# Patient Record
Sex: Female | Born: 1940
Health system: Southern US, Community
[De-identification: ages and names within clinical notes are randomized; demographics above are authoritative.]

## PROBLEM LIST (undated history)

## (undated) DIAGNOSIS — R319 Hematuria, unspecified: Secondary | ICD-10-CM

## (undated) DIAGNOSIS — R112 Nausea with vomiting, unspecified: Secondary | ICD-10-CM

## (undated) DIAGNOSIS — J309 Allergic rhinitis, unspecified: Secondary | ICD-10-CM

## (undated) DIAGNOSIS — M51369 Other intervertebral disc degeneration, lumbar region without mention of lumbar back pain or lower extremity pain: Secondary | ICD-10-CM

## (undated) DIAGNOSIS — E785 Hyperlipidemia, unspecified: Secondary | ICD-10-CM

## (undated) DIAGNOSIS — Z8619 Personal history of other infectious and parasitic diseases: Secondary | ICD-10-CM

## (undated) DIAGNOSIS — H3530 Unspecified macular degeneration: Secondary | ICD-10-CM

## (undated) DIAGNOSIS — S0340XA Sprain of jaw, unspecified side, initial encounter: Secondary | ICD-10-CM

## (undated) DIAGNOSIS — Z8719 Personal history of other diseases of the digestive system: Secondary | ICD-10-CM

## (undated) DIAGNOSIS — N3281 Overactive bladder: Secondary | ICD-10-CM

## (undated) DIAGNOSIS — E538 Deficiency of other specified B group vitamins: Secondary | ICD-10-CM

## (undated) DIAGNOSIS — N6009 Solitary cyst of unspecified breast: Secondary | ICD-10-CM

## (undated) DIAGNOSIS — M8580 Other specified disorders of bone density and structure, unspecified site: Secondary | ICD-10-CM

## (undated) DIAGNOSIS — Z860101 Personal history of adenomatous and serrated colon polyps: Secondary | ICD-10-CM

## (undated) DIAGNOSIS — K759 Inflammatory liver disease, unspecified: Secondary | ICD-10-CM

## (undated) DIAGNOSIS — D259 Leiomyoma of uterus, unspecified: Secondary | ICD-10-CM

## (undated) DIAGNOSIS — E559 Vitamin D deficiency, unspecified: Secondary | ICD-10-CM

## (undated) DIAGNOSIS — N61 Mastitis without abscess: Secondary | ICD-10-CM

## (undated) DIAGNOSIS — K5730 Diverticulosis of large intestine without perforation or abscess without bleeding: Secondary | ICD-10-CM

## (undated) DIAGNOSIS — M4800 Spinal stenosis, site unspecified: Secondary | ICD-10-CM

## (undated) DIAGNOSIS — Z87442 Personal history of urinary calculi: Secondary | ICD-10-CM

## (undated) DIAGNOSIS — N390 Urinary tract infection, site not specified: Secondary | ICD-10-CM

## (undated) DIAGNOSIS — I700 Atherosclerosis of aorta: Secondary | ICD-10-CM

## (undated) DIAGNOSIS — K449 Diaphragmatic hernia without obstruction or gangrene: Secondary | ICD-10-CM

## (undated) DIAGNOSIS — N3941 Urge incontinence: Secondary | ICD-10-CM

## (undated) DIAGNOSIS — K219 Gastro-esophageal reflux disease without esophagitis: Secondary | ICD-10-CM

## (undated) DIAGNOSIS — D219 Benign neoplasm of connective and other soft tissue, unspecified: Secondary | ICD-10-CM

## (undated) DIAGNOSIS — Z9889 Other specified postprocedural states: Secondary | ICD-10-CM

## (undated) DIAGNOSIS — N200 Calculus of kidney: Secondary | ICD-10-CM

## (undated) DIAGNOSIS — R079 Chest pain, unspecified: Secondary | ICD-10-CM

## (undated) DIAGNOSIS — G4700 Insomnia, unspecified: Secondary | ICD-10-CM

## (undated) HISTORY — DX: Hematuria, unspecified: R31.9

## (undated) HISTORY — DX: Urinary tract infection, site not specified: N39.0

## (undated) HISTORY — DX: Mastitis without abscess: N61.0

## (undated) HISTORY — DX: Sprain of jaw, unspecified side, initial encounter: S03.40XA

## (undated) HISTORY — DX: Benign neoplasm of connective and other soft tissue, unspecified: D21.9

## (undated) HISTORY — DX: Solitary cyst of unspecified breast: N60.09

## (undated) HISTORY — DX: Inflammatory liver disease, unspecified: K75.9

## (undated) HISTORY — DX: Spinal stenosis, site unspecified: M48.00

## (undated) HISTORY — DX: Insomnia, unspecified: G47.00

## (undated) HISTORY — PX: BREAST CYST ASPIRATION: SHX578

## (undated) HISTORY — DX: Overactive bladder: N32.81

## (undated) HISTORY — PX: LITHOTRIPSY: SUR834

## (undated) HISTORY — DX: Calculus of kidney: N20.0

---
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
## 1977-07-10 HISTORY — PX: TUBAL LIGATION: SHX77

## 1986-07-10 HISTORY — PX: CHOLECYSTECTOMY: SHX55

## 1987-07-11 HISTORY — PX: CHOLECYSTECTOMY: SHX55

## 1990-05-10 DIAGNOSIS — N61 Mastitis without abscess: Secondary | ICD-10-CM

## 1990-05-10 HISTORY — DX: Mastitis without abscess: N61.0

## 1997-07-10 DIAGNOSIS — N200 Calculus of kidney: Secondary | ICD-10-CM

## 1997-07-10 HISTORY — DX: Calculus of kidney: N20.0

## 1998-03-30 ENCOUNTER — Observation Stay (HOSPITAL_COMMUNITY): Admission: EM | Admit: 1998-03-30 | Discharge: 1998-04-01 | Payer: Self-pay | Admitting: Emergency Medicine

## 1998-03-30 ENCOUNTER — Encounter: Payer: Self-pay | Admitting: Emergency Medicine

## 1998-03-30 ENCOUNTER — Encounter: Payer: Self-pay | Admitting: Urology

## 1998-03-31 ENCOUNTER — Encounter: Payer: Self-pay | Admitting: Urology

## 1998-04-05 ENCOUNTER — Ambulatory Visit (HOSPITAL_COMMUNITY): Admission: RE | Admit: 1998-04-05 | Discharge: 1998-04-05 | Payer: Self-pay | Admitting: Urology

## 1998-05-28 ENCOUNTER — Other Ambulatory Visit: Admission: RE | Admit: 1998-05-28 | Discharge: 1998-05-28 | Payer: Self-pay | Admitting: *Deleted

## 1999-02-01 ENCOUNTER — Ambulatory Visit (HOSPITAL_COMMUNITY): Admission: RE | Admit: 1999-02-01 | Discharge: 1999-02-01 | Payer: Self-pay | Admitting: *Deleted

## 1999-02-01 ENCOUNTER — Encounter: Payer: Self-pay | Admitting: *Deleted

## 1999-04-10 HISTORY — PX: OTHER SURGICAL HISTORY: SHX169

## 1999-04-12 ENCOUNTER — Ambulatory Visit (HOSPITAL_COMMUNITY): Admission: RE | Admit: 1999-04-12 | Discharge: 1999-04-12 | Payer: Self-pay | Admitting: Obstetrics and Gynecology

## 1999-04-12 ENCOUNTER — Encounter (INDEPENDENT_AMBULATORY_CARE_PROVIDER_SITE_OTHER): Payer: Self-pay

## 1999-04-12 HISTORY — PX: DILATATION & CURRETTAGE/HYSTEROSCOPY WITH RESECTOCOPE: SHX5572

## 1999-05-09 ENCOUNTER — Ambulatory Visit (HOSPITAL_COMMUNITY): Admission: RE | Admit: 1999-05-09 | Discharge: 1999-05-09 | Payer: Self-pay | Admitting: Obstetrics and Gynecology

## 1999-05-09 ENCOUNTER — Encounter: Payer: Self-pay | Admitting: Obstetrics and Gynecology

## 1999-05-24 ENCOUNTER — Other Ambulatory Visit: Admission: RE | Admit: 1999-05-24 | Discharge: 1999-05-24 | Payer: Self-pay | Admitting: *Deleted

## 1999-12-14 ENCOUNTER — Other Ambulatory Visit: Admission: RE | Admit: 1999-12-14 | Discharge: 1999-12-14 | Payer: Self-pay | Admitting: *Deleted

## 2000-02-21 ENCOUNTER — Encounter: Payer: Self-pay | Admitting: Urology

## 2000-02-21 ENCOUNTER — Encounter: Admission: RE | Admit: 2000-02-21 | Discharge: 2000-02-21 | Payer: Self-pay | Admitting: Urology

## 2001-02-07 ENCOUNTER — Other Ambulatory Visit: Admission: RE | Admit: 2001-02-07 | Discharge: 2001-02-07 | Payer: Self-pay | Admitting: *Deleted

## 2001-12-09 ENCOUNTER — Ambulatory Visit (HOSPITAL_COMMUNITY): Admission: RE | Admit: 2001-12-09 | Discharge: 2001-12-09 | Payer: Self-pay | Admitting: Gastroenterology

## 2001-12-11 ENCOUNTER — Encounter: Admission: RE | Admit: 2001-12-11 | Discharge: 2001-12-11 | Payer: Self-pay | Admitting: Urology

## 2001-12-11 ENCOUNTER — Encounter: Payer: Self-pay | Admitting: Urology

## 2002-04-21 ENCOUNTER — Other Ambulatory Visit: Admission: RE | Admit: 2002-04-21 | Discharge: 2002-04-21 | Payer: Self-pay | Admitting: *Deleted

## 2003-03-18 ENCOUNTER — Emergency Department (HOSPITAL_COMMUNITY): Admission: EM | Admit: 2003-03-18 | Discharge: 2003-03-18 | Payer: Self-pay | Admitting: Emergency Medicine

## 2003-04-29 ENCOUNTER — Other Ambulatory Visit: Admission: RE | Admit: 2003-04-29 | Discharge: 2003-04-29 | Payer: Self-pay | Admitting: *Deleted

## 2003-06-16 ENCOUNTER — Ambulatory Visit: Admission: RE | Admit: 2003-06-16 | Discharge: 2003-06-16 | Payer: Self-pay | Admitting: Gynecologic Oncology

## 2005-06-29 ENCOUNTER — Other Ambulatory Visit: Admission: RE | Admit: 2005-06-29 | Discharge: 2005-06-29 | Payer: Self-pay | Admitting: Obstetrics and Gynecology

## 2006-08-20 ENCOUNTER — Other Ambulatory Visit: Admission: RE | Admit: 2006-08-20 | Discharge: 2006-08-20 | Payer: Self-pay | Admitting: Obstetrics & Gynecology

## 2007-12-09 ENCOUNTER — Other Ambulatory Visit: Admission: RE | Admit: 2007-12-09 | Discharge: 2007-12-09 | Payer: Self-pay | Admitting: Obstetrics & Gynecology

## 2010-11-25 NOTE — Procedures (Signed)
[place]  Patient:    Vanessa Trujillo, Vanessa Trujillo Visit Number: [id number] MRN: [id number]          Service Type: END Location: ENDO Attending Physician:  Rich Brave Dictated by:   Florencia Reasons, M.D. Proc. Date: 12/09/01 Admit Date:  12/09/2001   CC:         Andres Ege, M.D.   Procedure Report  PROCEDURE PERFORMED:  Colonoscopy.  ENDOSCOPIST:  Florencia Reasons, M.D.  INDICATIONS FOR PROCEDURE:  The patient is a 70 year old for colon cancer screening, no worrisome symptoms.  FINDINGS:  Normal exam to the cecum.  DESCRIPTION OF PROCEDURE:  The nature, purpose and risks of the procedure had been discussed with the patient, who provided written consent.  Sedation was fentanyl 65 mcg and Versed  7 mg IV without arrhythmias or desaturation.  The Olympus standard pediatric video colonoscope was advanced to the cecum with moderate difficulty due to looping, overcome by having the patient in the supine position with external abdominal compression and flipping her to the right lateral decubitus position to get the tip of the scope to enter the cecum.  Pullback was then performed.  The quality of the prep was excellent and it is felt that all areas were well seen.  This was a normal examination.  No polyps, cancer, colitis, vascular malformations or diverticulosis were noted and retroflexion in the rectum was normal.  Reinspection of the rectum was unremarkable.  No biopsies or photographs were obtained.  The patient tolerated the procedure well and there were no apparent complications.  IMPRESSION:  Normal screening colonoscopy.  PLAN:  Consider follow-up colonoscopy in five years. Dictated by:   Florencia Reasons, M.D. Attending Physician:  Rich Brave DD:  12/09/01 TD:  12/10/01 Job: [id number]

## 2010-11-25 NOTE — Consult Note (Signed)
NAME:  Vanessa Trujillo, DAULT NO.:  [id number]   MEDICAL RECORD NO.:  [id number]                   PATIENT TYPE:  OUT   LOCATION:  GYN                                  FACILITY:  [facility]   PHYSICIAN:  John T. Kyla Balzarine, M.D.                 DATE OF BIRTH:  [birth year]   DATE OF CONSULTATION:  06/16/2003  DATE OF DISCHARGE:                                   CONSULTATION   REPORT TITLE:  GYNECOLOGICAL CONSULTATION/CLINIC NOTE   CONSULTING PHYSICIAN:  John T. Kyla Balzarine, M.D.   REFERRING PHYSICIAN:  Andres Ege, M.D.   REASON FOR CONSULTATION:  Persistent ovarian cysts.   HISTORY OF PRESENT ILLNESS:  The patient has been followed by Dr. Laurena Bering for  many years, undergoing menopause in her mid 7s.  She was initially on ERT,  but this was discontinued several years ago.  She is currently using vaginal  estrogen.  The patient was found to have small ovarian cysts on ultrasound,  approximately three years ago.  The cysts include approximately a 3 cm thin-  walled cyst involving the right ovary, and approximately a 1.5 cm simple  cyst in the left ovary.  On serial scanning, the cysts have not  significantly changed, if anything, have decreased slightly in size, but the  last scans in January 2004 and subsequent scan in October 2004 raise the  question of multiple thin septations developing in the right adnexal cyst.  The patient has had several CA-125 values that are in the normal range.  The  patient and Dr. Laurena Bering wished a second opinion regarding options for  management of these cysts.   The patient reflects that she has no abdominal or pelvic symptoms, pelvic  pain, or dyspareunia.  Her menopausal symptoms are relatively well  controlled.  She is up to date on mammograms and colonoscopy.  Risk factors  for ovarian cancer include no family history of ovarian cancer, one family  member with premenopausal breast cancer, more than ten years of oral  contraceptive use  in the past, and status post BTL.   PAST MEDICAL HISTORY:  No major comorbidities.   PAST SURGICAL HISTORY:  Bilateral tubal ligation; NSVD.   MEDICATIONS:  1. Vaginal estrogen.  2. Calcium.  3. Multivitamins.   ALLERGIES:  None.   FAMILY HISTORY:  Mother with premenopausal breast cancer.  Maternal  grandmother may have had breast cancer at a later date, but no other family  members are known to have any type of gynecologic malignancies.   SOCIAL HISTORY:  Married.  Denies tobacco.  Very rare ethanol.   REVIEW OF SYSTEMS:  GENERAL:  Denies weight loss, fever or chill, diminished  appetite.  GASTROINTESTINAL:  Denies nausea or vomiting, early satiety,  bloating, gas/cramps, constipation, diarrhea, melena.  GENITOURINARY:  Denies urinary incontinence, urgency, or frequency, frequent UTI, or stones,  and denies hematuria.  OBSTETRICAL/GYNECOLOGICAL:  As per  HPI.  LYMPHATICS:  Denies lymphadenopathy.   PHYSICAL EXAMINATION:  VITAL SIGNS:  Weight 138 pounds, and vital signs  stable per clinic flow sheet.  GENERAL:  The patient is alert and oriented x3 in no acute distress.  LYMPHS:  There is no pathologic adenopathy.  BACK:  Nontender.  There is no CVA tenderness.  ABDOMEN:  Scaphoid soft and benign without ascites, mass, or organomegaly.  Laparoscopic incision site is well-healed without hernia.  There is no  pathologic lymphadenopathy.  EXTREMITIES:  Have full range of motion without edema.  PELVIC:  External genitalia and BUS normal to inspection palpation.  The  bladder and urethra are well supported and there are no vaginal or cervical  lesions.  There is no tenderness to cervical motion.  Uterus normal size and  shape.  Adnexa:  No palpable abnormality or cul-de-sac nodularity.  Rectovaginal examination confirms.   ASSESSMENT:  Benign persistent ovarian cysts.   RECOMMENDATIONS:  I had a long discussion with the patient, detailing her  low risk for a malignant lesion.  I  would recommend that she be scanned at  six month intervals and eventually transitioned to annual intervals as long  as there is no significant increase in size or development of features  worrisome for ovarian cancer.  Threshold for surgery would include patient  anxiety, development of pelvic symptoms, enlargement of the ovarian cysts,  or development of any features suggesting malignancy.  I would probably  monitor CA-125 values, although this is not failsafe.   If the patient elects surgery at present, it should be noted that she has  had no menopausal bleeding, has a very thin endometrial stripe, and is up to  date on cytology.  I believe the laparoscopic bilateral salpingo-  oophorectomy would be a reasonable option for her if she elected operative  management, unless she developed another reason to undergo hysterectomy.  The patient will consider these factors and probably elect nonsurgical  monitoring with Dr. Laurena Bering.  We would be glad to see her back at any time in  the future on a p.r.n. basis.                                               John T. Kyla Balzarine, M.D.    JTS/MEDQ  D:  06/16/2003  T:  06/16/2003  Job:  [id number]   cc:   Andres Ege, M.D.  [address]  [place]  [place] [address]  Fax: [phone]   Telford Nab, R.N.  [address]  [place], [place] [address]

## 2012-04-08 HISTORY — PX: COLONOSCOPY WITH PROPOFOL: SHX5780

## 2013-01-27 ENCOUNTER — Telehealth: Payer: Self-pay | Admitting: *Deleted

## 2013-01-27 NOTE — Telephone Encounter (Signed)
Call to [employer] to complete prior auth on macrodantin 50-mg #90, 1 po QD.  Clinical info provided to [facility], 1) Dx:  prophalxias of recurrent UTI 2) benefit > risk, also advised that Dr Hyacinth Meeker reviewed this with Dr Sherron Monday (urologist) 10-27-12.  Per Seymour Bars approved thru 07-09-13, for pharmacy to re-process in 2 hours and should go thru. Patient notified auth completed.

## 2013-04-08 ENCOUNTER — Other Ambulatory Visit: Payer: Self-pay | Admitting: Obstetrics & Gynecology

## 2013-04-08 NOTE — Telephone Encounter (Signed)
#  30/12 Rfs given @ AEX 04/01/12 ; Current AEX 05/08/13 with Dr.SM #30/0rfs sent to last patient till AEX

## 2013-04-16 DIAGNOSIS — N200 Calculus of kidney: Secondary | ICD-10-CM | POA: Insufficient documentation

## 2013-04-16 DIAGNOSIS — R3915 Urgency of urination: Secondary | ICD-10-CM | POA: Insufficient documentation

## 2013-04-16 DIAGNOSIS — N3941 Urge incontinence: Secondary | ICD-10-CM | POA: Insufficient documentation

## 2013-04-16 DIAGNOSIS — N390 Urinary tract infection, site not specified: Secondary | ICD-10-CM | POA: Insufficient documentation

## 2013-05-08 ENCOUNTER — Ambulatory Visit (INDEPENDENT_AMBULATORY_CARE_PROVIDER_SITE_OTHER): Payer: Medicare Other | Admitting: Obstetrics & Gynecology

## 2013-05-08 ENCOUNTER — Encounter: Payer: Self-pay | Admitting: Obstetrics & Gynecology

## 2013-05-08 VITALS — BP 120/68 | HR 64 | Resp 16 | Ht 64.0 in | Wt 143.0 lb

## 2013-05-08 DIAGNOSIS — Z01419 Encounter for gynecological examination (general) (routine) without abnormal findings: Secondary | ICD-10-CM

## 2013-05-08 DIAGNOSIS — N83201 Unspecified ovarian cyst, right side: Secondary | ICD-10-CM

## 2013-05-08 DIAGNOSIS — N83209 Unspecified ovarian cyst, unspecified side: Secondary | ICD-10-CM

## 2013-05-08 MED ORDER — NITROFURANTOIN MACROCRYSTAL 50 MG PO CAPS: 50.0000 mg | ORAL_CAPSULE | Freq: Every day | ORAL | Status: DC

## 2013-05-08 MED ORDER — CITALOPRAM HYDROBROMIDE 20 MG PO TABS: ORAL_TABLET | ORAL | Status: DC

## 2013-05-08 NOTE — Progress Notes (Signed)
72 y.o. G1P1 Widowed CaucasianF here for annual exam.  No vaginal bleeding.  Did have colonoscopy.  I do not have records.  Spouse has been deceased now about 15 months.  Pt with hx of recurrent UTIs.  Saw Dr. Logan Bores.  Had a CT scan and cystoscopy.  Had one very small stone.  Seeing Dr. Wylene Simmer.  Labs are now done there.     No LMP recorded. Patient is postmenopausal.          Sexually active: no  The current method of family planning is Menopause, BTL    Exercising: yes  Water Aerobics 3 days a week Smoker:  no  Health Maintenance: Pap:  02/01/10 neg History of abnormal Pap:  no MMG:  04/21/13 normal Colonoscopy: 08/2012 normal, repeat in 5 years BMD:   04/21/13, -1.0 TDaP: 03/2003 Screening Labs: Dr. Wylene Simmer, Hb today: PCP, Urine today: PCP   reports that she has never smoked. She has never used smokeless tobacco. She reports that she drinks about 0.5 ounces of alcohol per week. She reports that she does not use illicit drugs.  Past Medical History  Diagnosis Date  . Breast cyst     Aspirated  . Kidney stones 1999  . Chronic UTI   . Hematuria   . Spinal stenosis   . Nonpuerperal mastitis 11/91  . Fibroid     Past Surgical History  Procedure Laterality Date  . Tonsillectomy and adenoidectomy  1946  . Appendectomy  1959  . Tubal ligation  1979    BTL  . Cholecystectomy  1988  . Hysteroscopic resection  04/1999    small fibroid polyps    Current Outpatient Prescriptions  Medication Sig Dispense Refill  . citalopram (CELEXA) 20 MG tablet TAKE 1 TABLET BY MOUTH EVERY DAY  30 tablet  0  . Multiple Vitamin (MULTI-VITAMIN DAILY PO) Take by mouth daily.      . Multiple Vitamins-Minerals (EYE VITAMINS PO) Take by mouth daily.      Marland Kitchen MYRBETRIQ 50 MG TB24 tablet daily.       . nitrofurantoin (MACRODANTIN) 50 MG capsule daily.       . simvastatin (ZOCOR) 20 MG tablet 20 mg daily.       . fluticasone (FLONASE) 50 MCG/ACT nasal spray        No current facility-administered  medications for this visit.    Family History  Problem Relation Age of Onset  . Breast cancer Mother 9  . Heart failure Father   . Emphysema Father   . Breast cancer Maternal Grandmother 50    ROS:  Pertinent items are noted in HPI.  Otherwise, a comprehensive ROS was negative.  Exam:   BP 120/68  Pulse 64  Resp 16  Ht 5\' 4"  (1.626 m)  Wt 143 lb (64.864 kg)  BMI 24.53 kg/m2  Weight change: +2lbs   Height: 5\' 4"  (162.6 cm)  Ht Readings from Last 3 Encounters:  05/08/13 5\' 4"  (1.626 m)    General appearance: alert, cooperative and appears stated age Head: Normocephalic, without obvious abnormality, atraumatic Neck: no adenopathy, supple, symmetrical, trachea midline and thyroid normal to inspection and palpation Lungs: clear to auscultation bilaterally Breasts: normal appearance, no masses or tenderness Heart: regular rate and rhythm Abdomen: soft, non-tender; bowel sounds normal; no masses,  no organomegaly Extremities: extremities normal, atraumatic, no cyanosis or edema Skin: Skin color, texture, turgor normal. No rashes or lesions Lymph nodes: Cervical, supraclavicular, and axillary nodes normal. No abnormal inguinal nodes  palpated Neurologic: Grossly normal   Pelvic: External genitalia:  no lesions              Urethra:  normal appearing urethra with no masses, tenderness or lesions              Bartholins and Skenes: normal                 Vagina: normal appearing vagina with normal color and discharge, 1.5cm nodule on right side of vaginal wall              Cervix: no lesions              Pap taken: no Bimanual Exam:  Uterus:  normal size, contour, position, consistency, mobility, non-tender              Adnexa: normal adnexa and no mass, fullness, tenderness               Rectovaginal: Confirms               Anus:  normal sphincter tone, no lesions  A:  Well Woman with normal exam PMP, no HRT Needs TDaP OAB H/O recurrent UTI's, on chronic suppression.  Saw  Dr. Logan Bores this year with cystoscopy and CT. Insomnia H/O ovarian cysts, new lesion within  P:   Mammogram yearly pap smear 2011.  No Pap today. Labs with Dr. Wylene Simmer. Citalopram 20mg  daily.  Rx to pharmacy. Nitrofurantoin 50mg  daily.  Rx to pharmacy.  Needs urine culture with any UTI.  Consider changing to Trimethoprim if recurrent UTIs continue.  return annually or prn  An After Visit Summary was printed and given to the patient.

## 2013-05-08 NOTE — Patient Instructions (Signed)

## 2013-05-09 ENCOUNTER — Telehealth: Payer: Self-pay | Admitting: Obstetrics & Gynecology

## 2013-05-09 NOTE — Telephone Encounter (Signed)
LMTCB to discuss ins benefits for upcoming PUS.  °

## 2013-05-13 ENCOUNTER — Ambulatory Visit (INDEPENDENT_AMBULATORY_CARE_PROVIDER_SITE_OTHER): Payer: Medicare Other | Admitting: Obstetrics & Gynecology

## 2013-05-13 ENCOUNTER — Other Ambulatory Visit: Payer: Self-pay | Admitting: Obstetrics & Gynecology

## 2013-05-13 ENCOUNTER — Ambulatory Visit (INDEPENDENT_AMBULATORY_CARE_PROVIDER_SITE_OTHER): Payer: Medicare Other

## 2013-05-13 VITALS — BP 102/60 | Ht 64.0 in | Wt 143.4 lb

## 2013-05-13 DIAGNOSIS — N83209 Unspecified ovarian cyst, unspecified side: Secondary | ICD-10-CM

## 2013-05-13 DIAGNOSIS — N83201 Unspecified ovarian cyst, right side: Secondary | ICD-10-CM

## 2013-05-13 DIAGNOSIS — D259 Leiomyoma of uterus, unspecified: Secondary | ICD-10-CM

## 2013-05-13 DIAGNOSIS — D251 Intramural leiomyoma of uterus: Secondary | ICD-10-CM

## 2013-05-13 DIAGNOSIS — D219 Benign neoplasm of connective and other soft tissue, unspecified: Secondary | ICD-10-CM

## 2013-05-13 NOTE — Progress Notes (Signed)
72 y.o.Marriedfemale here for a pelvic ultrasound with know hx of fibroids and bilateral ovarian cysts that have been avascular but do not completely look like simple cysts.  I have not scanned her in several years.  She was here recently for AEX and we discussed this hx.  Decided to repeat scan this year.  No new pain/pressure issues.  No vaginal bleeding.  Biggest issue, of late, has been recurrent UTIs.  Seeing Dr. Logan Bores at [facility] for this.  No LMP recorded. Patient is postmenopausal.  Sexually active:  yes  Contraception: PMP  FINDINGS: UTERUS: 5.5 x 3.9 x 2.3cm with 2 small intramural fibroids and 3.6 pedunculated fibroid to right (all stable) EMS: 1.63mm ADNEXA:   Left ovary 2.4 x 1.7 x 1.5cm with 38m cyst containing debris.  Avascular.  Stable.    Right ovary 2.7 x 1.4 x 1.4cm with 18mm cyst containing debris.  Avascular.  Stable. CUL DE SAC: neg for free fluid  Images reviewed with pt.  All questions answered.  Comparison made to 2011 u/s.  Uterus and ovaries are stable.    Assessment:  Uterine fibroids, bilateral ovarian cysts Plan: F/U for AEX 1 yr.  ~15 minutes spent with patient >50% of time was in face to face discussion of above.

## 2013-05-15 ENCOUNTER — Other Ambulatory Visit: Payer: Self-pay

## 2013-05-19 ENCOUNTER — Encounter: Payer: Self-pay | Admitting: Obstetrics & Gynecology

## 2013-05-19 ENCOUNTER — Ambulatory Visit (INDEPENDENT_AMBULATORY_CARE_PROVIDER_SITE_OTHER): Payer: Medicare Other | Admitting: Obstetrics & Gynecology

## 2013-05-19 VITALS — Ht 65.5 in | Wt 143.0 lb

## 2013-05-19 DIAGNOSIS — N390 Urinary tract infection, site not specified: Secondary | ICD-10-CM

## 2013-05-19 MED ORDER — CIPROFLOXACIN HCL 500 MG PO TABS: 500.0000 mg | ORAL_TABLET | Freq: Two times a day (BID) | ORAL | Status: DC

## 2013-05-19 NOTE — Telephone Encounter (Signed)
Spoke with patient. Appointment scheduled. She will come now to see Dr. Amie Critchley

## 2013-05-19 NOTE — Patient Instructions (Signed)
Please call if symptoms are worse tomorrow.

## 2013-05-19 NOTE — Telephone Encounter (Signed)
Patient thinks she has a UTI. Patient would like an appointment today.

## 2013-05-19 NOTE — Progress Notes (Signed)
Subjective:     Patient ID: Vanessa Trujillo, female   DOB: 1941-05-15, 72 y.o.   MRN: [id number]  Urinary Tract Infection    72 yo WWF, well known to me, with h/o recurrent UTIs with onset of symptoms this morning including urgency, frequency, dysuria.  Has not seen any blood.  No fevers.  Just feeling a little achy.  Had UTI last month.  Saw Dr. Logan Bores.  Reviewed via Care Everywhere in EPIC.  Klebsiella was seen.  Intermediate sensitivity to Macrobid.  Resistant to Ampicillin.  Pt cannot give me a sample today.  Review of Systems  All other systems reviewed and are negative.       Objective:   Physical Exam  Constitutional: She is oriented to person, place, and time. She appears well-developed and well-nourished.  Genitourinary: Vagina normal.  Musculoskeletal: Normal range of motion.  Neurological: She is alert and oriented to person, place, and time.  Skin: Skin is warm and dry.  Psychiatric: She has a normal mood and affect.   Cath u/a performed under sterile conditions for urine sample.    Assessment:     UTI H/O recurrent UTIs, on suppression     Plan:     Urine culture  Cipro 500mg  BID x 7 days.  Will need TOC. Pt to stop macrobid right now and will restart after culture, unless this needs to be changed

## 2013-05-20 LAB — URINE CULTURE: Organism ID, Bacteria: NO GROWTH

## 2013-05-21 NOTE — Telephone Encounter (Signed)
Patient returned call. She states she is feeling much improved. She will complete antibiotics and follow up prn with Dr. Hyacinth Meeker and Dr. Logan Bores.  I advised that if we heard any new instructions from Dr. Logan Bores we would return call, patient is agreeable to plan.    Routing to provider for final review. Patient agreeable to disposition. Will close encounter

## 2013-05-21 NOTE — Telephone Encounter (Signed)
Message copied by Joeseph Amor on Wed May 21, 2013 10:19 AM ------      Message from: Jerene Bears      Created: Wed May 21, 2013  5:37 AM       Please call pt and inform culture is negative.  How is she feeling?  I want her to finish the antibiotics.  I have sent copy of this and note to dr. Logan Bores.  Stay on the macrobid for now and I have asked him if he has any other recommendations. ------

## 2013-05-21 NOTE — Telephone Encounter (Signed)
Message left to return call to [place] at [phone] on home and mobile.

## 2013-05-22 ENCOUNTER — Encounter: Payer: Self-pay | Admitting: Obstetrics & Gynecology

## 2013-05-22 NOTE — Patient Instructions (Signed)
Please call if you have any new problems/issues 

## 2013-06-03 ENCOUNTER — Other Ambulatory Visit: Payer: Self-pay | Admitting: Obstetrics & Gynecology

## 2013-06-03 NOTE — Telephone Encounter (Signed)
AEX was 05/08/13   Macrodantin 50 mg #90/4 refills was sent   No rx was sent for Myrbetriq okay to refill as well?  Please advise.

## 2013-06-09 ENCOUNTER — Ambulatory Visit (INDEPENDENT_AMBULATORY_CARE_PROVIDER_SITE_OTHER): Payer: Medicare Other | Admitting: Obstetrics & Gynecology

## 2013-06-09 ENCOUNTER — Encounter: Payer: Self-pay | Admitting: Obstetrics & Gynecology

## 2013-06-09 VITALS — BP 120/60 | HR 60 | Resp 16 | Ht 65.5 in | Wt 143.0 lb

## 2013-06-09 DIAGNOSIS — N390 Urinary tract infection, site not specified: Secondary | ICD-10-CM

## 2013-06-09 MED ORDER — TRIMETHOPRIM 100 MG PO TABS: 100.0000 mg | ORAL_TABLET | Freq: Every day | ORAL | Status: DC

## 2013-06-09 NOTE — Progress Notes (Signed)
Subjective:     Patient ID: Vanessa Trujillo, female   DOB: Jul 09, 1941, 72 y.o.   MRN: [id number]  HPI Extremely nice 72 yo G1P1 WWF here to discuss recent urine culture and possible change in antibiotics.  Pt has had several UTI's this year.  Most recently, had symptoms in early November.  Urine culture was obtained before pt started treatment.  Culture was negative.  Pt was prescribed antibiotics while culture results were pending.  She was called and advised of results but by that point was doing much better.  She was surprised cultrue was engative.  Has been on macrobid for years for suppression.  She did see Dr. Logan Bores this year with negative evaluation.  She and I discussed stopping the macrobid and changing to different suppresion.  I have sent message through EPIC to Dr. Logan Bores as well as faxing results to him.  No response received.  D/W this and that I would keep trying to let him know about recent visit with patient.  Review of Systems  All other systems reviewed and are negative.       Objective:   Physical Exam  Constitutional: She is oriented to person, place, and time. She appears well-developed and well-nourished.  Neurological: She is alert and oriented to person, place, and time.  Psychiatric: She has a normal mood and affect.       Assessment:     Recurrent UTIs, asymptomatic today     Plan:     Switch to trimethoprim 100mg  daily for suppression.  Rx to pharmacy. Pt to call with any UTI symptoms.

## 2013-06-09 NOTE — Patient Instructions (Signed)
Please call with any new problems/issues. 

## 2013-06-10 ENCOUNTER — Encounter: Payer: Self-pay | Admitting: Obstetrics & Gynecology

## 2013-08-11 ENCOUNTER — Encounter: Payer: Self-pay | Admitting: Nurse Practitioner

## 2013-08-11 ENCOUNTER — Ambulatory Visit (INDEPENDENT_AMBULATORY_CARE_PROVIDER_SITE_OTHER): Payer: Medicare Other | Admitting: Nurse Practitioner

## 2013-08-11 VITALS — BP 118/76 | HR 64 | Temp 98.7°F | Ht 65.5 in | Wt 141.0 lb

## 2013-08-11 DIAGNOSIS — R300 Dysuria: Secondary | ICD-10-CM

## 2013-08-11 LAB — POCT URINALYSIS DIPSTICK
Bilirubin, UA: NEGATIVE
Glucose, UA: NEGATIVE
Ketones, UA: NEGATIVE
Nitrite, UA: NEGATIVE
Urobilinogen, UA: NEGATIVE
pH, UA: 6.5

## 2013-08-11 MED ORDER — CIPROFLOXACIN HCL 500 MG PO TABS: 500.0000 mg | ORAL_TABLET | Freq: Two times a day (BID) | ORAL | Status: DC

## 2013-08-11 NOTE — Progress Notes (Signed)
Dr.Subjective:     Patient ID: Vanessa Trujillo, female   DOB: [birth year], 73 y.o.   MRN: [id number]  This 73 yo WW Fe presents with UTI symptoms since this am.  She denies fever/ chills, nausea and vomiting. maybe some vague low back pain.   She has been having chronic symptoms for some time.  Saw Dr. Amalia Hailey first of December and Myrbetriq was increased from 25  mg to 50 mg. She notes no improvement with urge incontinence and now is wearing a light pad.  Her previous Uro evaluation has been quite extensive with CT with no problems found. Not SA X 15 yrs ago. Husband has been deceased for several years.  She is also followed by Dr. Sabra Heck and has a recheck appointment of March 6 th.  She is off Estring.  States she does not drink enough fluids in the winter.   Urinary Tract Infection  Associated symptoms include urgency. Pertinent negatives include no chills, flank pain or frequency.   Review of Systems  Constitutional: Negative for fever, chills and fatigue.  Respiratory: Negative.   Cardiovascular: Negative.   Gastrointestinal: Negative.  Negative for abdominal pain, diarrhea, constipation and abdominal distention.  Genitourinary: Positive for dysuria, urgency and decreased urine volume. Negative for frequency, flank pain, vaginal bleeding, vaginal discharge, vaginal pain and dyspareunia.  Psychiatric/Behavioral: Negative.        Objective:   Physical Exam  Constitutional: She is oriented to person, place, and time. She appears well-developed and well-nourished. No distress.  Abdominal: Soft. She exhibits no distension. There is tenderness. There is no rebound and no guarding.  No flank pain, some discomfort over lower abdomen to palpation.  Genitourinary:  Urine abnormal with large  RBC, 2 + WBC and trace protein.  Neurological: She is alert and oriented to person, place, and time.  Psychiatric: She has a normal mood and affect. Her behavior is normal. Judgment and thought content normal.        Assessment:     R/O UTI - chronic history of same    Plan:     Will hold Trimpex for now and start on Cipro 500 mg BID # 14 Call with urine culture and micro reports Recheck TOC in 2 weeks and still keep appointment with Dr. Sabra Heck in 4 weeks. Increase po fluids, add cranberry tabs daily as directed.

## 2013-08-11 NOTE — Patient Instructions (Signed)
Urinary Tract Infection Urinary tract infections (UTI's) can develop anywhere along your urinary tract. Your urinary tract is your body's drainage system for removing wastes and extra water. Your urinary tract includes two kidneys, two ureters, a bladder, and a urethra. Your kidneys are a pair of bean-shaped organs. Each kidney is about the size of your fist. They are located below your ribs, one on each side of your spine. CAUSES Infections are caused by microbes, which are microscopic organisms, including fungi, viruses, and bacteria. These organisms are so small that they can only be seen through a microscope. Bacteria are the microbes that most commonly cause UTI's. SYMPTOMS  Symptoms of UTI's may vary by age and gender of the patient and by the location of the infection. Symptoms in young women typically include a frequent and intense urge to urinate and a painful, burning feeling in the bladder or urethra during urination. Older women and men are more likely to be tired, shaky, and weak and have muscle aches and abdominal pain. A fever may mean the infection is in your kidneys. Other symptoms of a kidney infection include pain in your back or sides below the ribs, nausea, and vomiting. DIAGNOSIS To diagnose a UTI, your caregiver will ask you about your symptoms. Your caregiver also will ask to provide a urine sample. The urine sample will be tested for bacteria and white blood cells. White blood cells are made by your body to help fight infection. TREATMENT  Typically, UTI's can be treated with medication. Because most UTI's are caused by a bacterial infection, they usually can be treated with the use of antibiotics. The choice of antibiotic and length of treatment depend on your symptoms and the type of bacteria causing your infection. HOME CARE INSTRUCTIONS  If you were prescribed antibiotics, take them exactly as your caregiver instructs you. Finish the medication even if you feel better after  you have only taken some of the medication.  Drink enough water and fluids to keep your urine clear or pale yellow.  Avoid caffeine, tea, and carbonated beverages. They tend to irritate your bladder.  Empty your bladder often. Avoid holding urine for long periods of time.  Empty your bladder before and after sexual intercourse.  After a bowel movement, women should cleanse from front to back. Use each tissue only once. SEEK MEDICAL CARE IF:   You have back pain.  You develop a fever.  Your symptoms do not begin to resolve within 3 days. SEEK IMMEDIATE MEDICAL CARE IF:   You have severe back pain or lower abdominal pain.  You develop chills.  You have nausea or vomiting.  You have continued burning or discomfort with urination. MAKE SURE YOU:   Understand these instructions.  Will watch your condition.  Will get help right away if you are not doing well or get worse. Document Released: 04/05/2005 Document Revised: 12/26/2011 Document Reviewed: 08/04/2011 Exit Care Patient Information 2014 Exit [employer].   Cranberry tablets 1-2

## 2013-08-11 NOTE — Progress Notes (Signed)
Reviewed personally.  M. Suzanne Cherese Lozano, MD.  

## 2013-08-12 LAB — URINALYSIS, MICROSCOPIC ONLY
Casts: NONE SEEN
Crystals: NONE SEEN
Squamous Epithelial / LPF: NONE SEEN
WBC, UA: >50 WBC/hpf — AB (ref <3)

## 2013-08-13 LAB — URINE CULTURE: Colony Count: 5000

## 2013-08-15 ENCOUNTER — Telehealth: Payer: Self-pay | Admitting: *Deleted

## 2013-08-15 NOTE — Telephone Encounter (Signed)
I have attempted to contact this patient by phone with the following results: left message to return my call on answering machine (home per ROI).  

## 2013-08-15 NOTE — Telephone Encounter (Signed)
Message copied by Graylon Good on Fri Aug 15, 2013  1:49 PM ------      Message from: Kem Boroughs R      Created: Thu Aug 14, 2013  8:52 AM       Let patient know that urine culture showed insignificant growth but she did have significant WBC on micro urine test.  Have her to finish antibiotics and still do TOC in 2 weeks.  Then she is seeing Dr. Sabra Heck for follow up in first of March. ------

## 2013-08-19 NOTE — Telephone Encounter (Signed)
Pt notified in result note.  

## 2013-08-25 ENCOUNTER — Ambulatory Visit (INDEPENDENT_AMBULATORY_CARE_PROVIDER_SITE_OTHER): Payer: Medicare Other | Admitting: *Deleted

## 2013-08-25 VITALS — BP 121/73 | HR 75 | Resp 14 | Wt 143.0 lb

## 2013-08-25 DIAGNOSIS — N390 Urinary tract infection, site not specified: Secondary | ICD-10-CM

## 2013-08-25 NOTE — Progress Notes (Signed)
Urine collected sent for Culture.

## 2013-08-27 ENCOUNTER — Telehealth: Payer: Self-pay

## 2013-08-27 LAB — URINE CULTURE
Colony Count: NO GROWTH
Organism ID, Bacteria: NO GROWTH

## 2013-08-27 NOTE — Telephone Encounter (Signed)
Message copied by Susy Manor on Wed Aug 27, 2013 12:52 PM ------      Message from: Regina Eck      Created: Wed Aug 27, 2013 12:24 PM       Notify urine culture negative, complete medication she is on      Keep appt. With Dr. Sabra Heck as planned ------

## 2013-08-27 NOTE — Telephone Encounter (Signed)
lmtcb

## 2013-08-28 NOTE — Telephone Encounter (Signed)
Patient notified of results.

## 2013-09-01 ENCOUNTER — Telehealth: Payer: Self-pay | Admitting: Obstetrics & Gynecology

## 2013-09-01 NOTE — Telephone Encounter (Signed)
Need chart to see what she has been on already.

## 2013-09-01 NOTE — Telephone Encounter (Signed)
Patient wants to speak with the nurse about medication questions about Myrbetriq.

## 2013-09-01 NOTE — Telephone Encounter (Signed)
Dr. Sabra Heck, patient is calling and asking if there is any other medications you may recommend instead of the Mybetriq 50 mg ER tablets as it is costly at 250/month. I spoke with [facility] her rx provider and it is covered but is a tier 3 drug, that is not qualified for tier exception on the the dx of OAB.   Do you have any other suggestions for medications or an appeal can be made for medical necessity. Chart to your office.

## 2013-09-10 NOTE — Telephone Encounter (Signed)
Her chart is one my desk.  I don't think she has tried [place].  Can you check chart.  Don't know about coverage.  She will have to check her medication booklet but we can try this.  Coverage is usually decent.

## 2013-09-10 NOTE — Telephone Encounter (Signed)
Called patient to r/s appointment and she asked about Dr Sabra Heck finding a similar, but cheaper medication to Myrbetriq. This was prescribed by Dr Amalia Hailey after we referred her there. We have prescribed several different similar medications in the past. She has been off this x one week and can tell it definitely was working, but is just too expensive. Is aware that Dr Sabra Heck is out of the office for the rest of the week, but will send her a message and if no reply can check with Dr Quincy Simmonds. Please advise.

## 2013-09-11 NOTE — Telephone Encounter (Signed)
Left detailed message for patient//kn

## 2013-09-12 ENCOUNTER — Ambulatory Visit: Payer: Medicare Other | Admitting: Obstetrics & Gynecology

## 2013-09-16 ENCOUNTER — Encounter: Payer: Self-pay | Admitting: Obstetrics & Gynecology

## 2013-09-17 ENCOUNTER — Encounter: Payer: Self-pay | Admitting: Obstetrics & Gynecology

## 2013-09-17 ENCOUNTER — Ambulatory Visit (INDEPENDENT_AMBULATORY_CARE_PROVIDER_SITE_OTHER): Payer: Medicare Other | Admitting: Obstetrics & Gynecology

## 2013-09-17 VITALS — BP 118/64 | HR 58 | Resp 12 | Ht 65.5 in | Wt 142.6 lb

## 2013-09-17 DIAGNOSIS — N390 Urinary tract infection, site not specified: Secondary | ICD-10-CM

## 2013-09-17 MED ORDER — CIPROFLOXACIN HCL 500 MG PO TABS: 500.0000 mg | ORAL_TABLET | Freq: Two times a day (BID) | ORAL | Status: DC

## 2013-09-17 NOTE — Progress Notes (Signed)
73 y.o. Widowed Caucasian female G1P1 here for follow up of recurrent UTIs.  Stopped the myrbetriq 25mg  due to cost.  Saw [name] who started her on Vesicare 5mg .  She has samples for both 5 and 10mg .  Can't really tell much of a difference after two days with the 5mg .  Advised just to go ahead and increase dosage to 10mg .  She is on the trimethoprim without any issues now for several months.  Going to [place] for 2 weeks and would like rx for antibiotics, just in case.    D/w pt options for OAB meds. She is going to check her medicare book and let me know as I will probably need to a prior-authorization for anything other than the generic medications.  She has been on oxybutynin which doesn't seem to be helping now.  The Myrbetriq really did work well but she can't justify >$200/month.   O: Healthy WD,WN female Affect: normal  A:OAB Recurrent UTI  P: Pt will call and let me know about the coverage in her Medicare book.   No rx needed for Trimethoprim Cipro 500mg  bid x 14 days for trip.  Rx to pharmacy.  ~15 minutes spent with patient >50% of time was in face to face discussion of above.

## 2013-09-17 NOTE — Patient Instructions (Signed)
Call and let me know about the coverage for the medications we discussed.

## 2013-09-25 NOTE — Telephone Encounter (Signed)
Patient is calling kelly back about medications

## 2013-09-26 MED ORDER — FESOTERODINE FUMARATE ER 4 MG PO TB24: 4.0000 mg | ORAL_TABLET | Freq: Every day | ORAL | Status: DC

## 2013-09-26 NOTE — Telephone Encounter (Signed)
Spoke with Dr. Sabra Heck, okay to order Toviaz 4 mg 1 tablet daily. Spoke with patient, advised order sent to [facility] and if has any issues with Lisbeth Ply will then submit tier exception for Myrbetriq.   Patient agreeable to plan and will call back with any issues on Toviaz.   Routing to provider for final review. Patient agreeable to disposition. Will close encounter

## 2013-09-26 NOTE — Telephone Encounter (Signed)
Dr. Sabra Heck, patient is on vesicare samples but is having "extremely dry mouth" per patient. Now out of those samples. Patient has not tried [place].  Patient states she is currently followed by her eye doctor q 3 months for glaucoma.  She states "I would rather have dry mouth than pay for mybetriq" as well.   Advised would send request to Dr. Sabra Heck, would place order for Vesicare if agreeable.

## 2013-09-26 NOTE — Telephone Encounter (Signed)
Patient requesting a RX for either Vesicare or Toviaz. She has some question first.

## 2013-12-06 ENCOUNTER — Other Ambulatory Visit: Payer: Self-pay | Admitting: Obstetrics & Gynecology

## 2013-12-08 NOTE — Telephone Encounter (Signed)
Pt calling the nurse regarding her Rx for [place]. She does not think the medication is helping her at all. Thinks the dosage may need to be changed. She also thinks she may have another uti.

## 2013-12-08 NOTE — Telephone Encounter (Signed)
Last AEX 04/2013  Last refill 09/26/13 #30/1 refill Next appt 05/2014   Please approve or deny Rx.

## 2013-12-08 NOTE — Telephone Encounter (Signed)
Spoke with patient. She feels she had a urinary tract infection about two weeks ago and took the Cipro that she had on hand. She completed the Cipro 500 mg bid for 7 days on 12/05/13. She is requesting a nurse appointment to "check my urine to make sure the infection is gone". She denies complaints at this time. She states "I am feeling well, but I don't think the Lisbeth Ply is working at all!" She wants to know if she should increase the dose of toviaz or if we could start the process for prior authorization of Mybetriq since that works the best. I advised I would send a message to Dr. Sabra Heck to obtain orders.   Okay for nurse appointment for urine culture and increase toviaz or complete prior auth for Mybetriq?

## 2013-12-09 ENCOUNTER — Telehealth: Payer: Self-pay | Admitting: Emergency Medicine

## 2013-12-09 NOTE — Telephone Encounter (Signed)
Karen Chafe, RN at 12/08/2013 4:59 PM     Status: Signed        Spoke with patient. She feels she had a urinary tract infection about two weeks ago and took the Cipro that she had on hand. She completed the Cipro 500 mg bid for 7 days on 12/05/13. She is requesting a nurse appointment to "check my urine to make sure the infection is gone". She denies complaints at this time. She states "I am feeling well, but I don't think the Lisbeth Ply is working at all!" She wants to know if she should increase the dose of toviaz or if we could start the process for prior authorization of Mybetriq since that works the best. I advised I would send a message to Dr. Sabra Heck to obtain orders.  Okay for nurse appointment for urine culture and increase toviaz or complete prior auth for Mybetriq?

## 2013-12-10 MED ORDER — FESOTERODINE FUMARATE ER 8 MG PO TB24: 8.0000 mg | ORAL_TABLET | Freq: Every day | ORAL | Status: DC

## 2013-12-10 NOTE — Addendum Note (Signed)
Addended by: Megan Salon on: 12/10/2013 04:13 PM   Modules accepted: Orders

## 2013-12-10 NOTE — Telephone Encounter (Signed)
Increase Toviaz to 8mg  daily.    Start prior authorization for Mybetriq.  Ok to schedule for urine TOC with nursing visit.

## 2013-12-10 NOTE — Telephone Encounter (Signed)
I just increased her dosage to 8mg  but I think we should do the Merbetriq prior authorization.

## 2013-12-11 NOTE — Telephone Encounter (Signed)
Prior authorization sent to [facility]:

## 2013-12-12 ENCOUNTER — Ambulatory Visit: Payer: Medicare Other

## 2013-12-15 ENCOUNTER — Ambulatory Visit (INDEPENDENT_AMBULATORY_CARE_PROVIDER_SITE_OTHER): Payer: Medicare Other | Admitting: *Deleted

## 2013-12-15 VITALS — BP 110/66 | HR 96 | Resp 18 | Ht 65.5 in | Wt 143.0 lb

## 2013-12-15 DIAGNOSIS — N390 Urinary tract infection, site not specified: Secondary | ICD-10-CM

## 2013-12-15 NOTE — Progress Notes (Signed)
Patient presents today for TOC. Done with Cipro. Patient denies any Sx.

## 2013-12-16 LAB — URINE CULTURE
Colony Count: NO GROWTH
Organism ID, Bacteria: NO GROWTH

## 2013-12-16 LAB — URINALYSIS, MICROSCOPIC ONLY
Bacteria, UA: NONE SEEN
CASTS: NONE SEEN
Crystals: NONE SEEN
Squamous Epithelial / LPF: NONE SEEN

## 2013-12-23 NOTE — Telephone Encounter (Signed)
Spoke with patient, message from Dr. Sabra Heck given and scheduled office visit for Cath UA with Dr. Sabra Heck for 12/29/13.  Routing to provider for final review. Patient agreeable to disposition. Will close encounter

## 2013-12-23 NOTE — Telephone Encounter (Signed)
Message left to return call to [place] at [phone].  Patient needs result note as below from Dr. Sabra Heck and cath UA office visit.

## 2013-12-23 NOTE — Telephone Encounter (Signed)
Message copied by Michele Mcalpine on Tue Dec 23, 2013  3:15 PM ------      Message from: Megan Salon      Created: Sun Dec 21, 2013 10:44 PM       Please inform pt that the urine culture was negative.  However this is a little blood in her urine still.  She should return for a cath u/a with me.  If RBCs still present, I will have he see Dr. Amalia Hailey again.  Thanks.  I did sent the following message to her through [facility].            Vanessa Trujillo,      Your urine culture is negative.  There is a small amount of blood still present in your urine.  I would like you to return for a catheterized urine to make sure this blood is real.  It could just come from the thinner skin that is present after menopause.  It really shouldn't be present but it is a tiny amount.  I just want to be sure.  One of the nurses at my office will be calling you with specific instructions.  Thanks.            Dr. Sabra Heck ------

## 2013-12-26 ENCOUNTER — Telehealth: Payer: Self-pay | Admitting: Obstetrics & Gynecology

## 2013-12-26 ENCOUNTER — Encounter: Payer: Self-pay | Admitting: Obstetrics and Gynecology

## 2013-12-26 ENCOUNTER — Ambulatory Visit (INDEPENDENT_AMBULATORY_CARE_PROVIDER_SITE_OTHER): Payer: Medicare Other | Admitting: Obstetrics and Gynecology

## 2013-12-26 VITALS — BP 118/64 | HR 80 | Ht 65.5 in | Wt 141.4 lb

## 2013-12-26 DIAGNOSIS — N390 Urinary tract infection, site not specified: Secondary | ICD-10-CM

## 2013-12-26 DIAGNOSIS — R300 Dysuria: Secondary | ICD-10-CM

## 2013-12-26 LAB — POCT URINALYSIS DIPSTICK
Bilirubin, UA: NEGATIVE
Glucose, UA: NEGATIVE
KETONES UA: NEGATIVE
Nitrite, UA: NEGATIVE
PH UA: 5
Protein, UA: 8
UROBILINOGEN UA: NEGATIVE

## 2013-12-26 MED ORDER — NITROFURANTOIN MONOHYD MACRO 100 MG PO CAPS: 100.0000 mg | ORAL_CAPSULE | Freq: Two times a day (BID) | ORAL | Status: DC

## 2013-12-26 NOTE — Progress Notes (Signed)
Patient ID: Vanessa Trujillo, female   DOB: 1941-06-24, 73 y.o.   MRN: [id number] GYNECOLOGY VISIT  PCP:   [name]  Referring Vanessa Trujillo:   HPI: 73 y.o.   Married  Caucasian  female   G1P1 with Patient's last menstrual period was 07/11/1999.   here for   Possible urinary tract infection/lower pressure with urinating.  Having painful urination.  States she usually had blood in the urine whenever she has a urinalysis done. When voids, only a small amount comes out and patient states she has bladder contractions and pain. Urination does not relieve the pain.  Taking over the counter uricom (pyyidium) Uncertain if having fever. Feels chilled sometimes.  Some nausea. No vomiting.   Is scheduled for cath urine in 3 days with Dr. Sabra Heck as patient has had urinary discomfort and has had multiple negative urine cultures.  Has an Rx for Ciprofloxacin but has not taken any.   History of nephrolithiasis years ago, treated surgically.   Has chronic urinary problems. Saw Dr. Amalia Hailey.  Had cystoscopy which was normal. No interstitial cystitis.  Placed on Myrbetriq. Was too expensive so started Toviaz. Notes mouth dryness.  Urine:  2+WBC's, Mod.RBC's, 8 Protein  GYNECOLOGIC HISTORY: Patient's last menstrual period was 07/11/1999. Sexually active:  no Partner preference: female Contraception: postmenopausal   Menopausal hormone therapy:  DES exposure:  no  Blood transfusions:   no Sexually transmitted diseases:   no GYN procedures and prior surgeries:  Tubal, breast cyst aspiration, hysteroscopic resection small fibroid polyps 04/1999. Last mammogram:   10-12-14wnl              Last pap and high risk HPV testing: 02-01-10 wnl   History of abnormal pap smear:  no   OB History   Grav Para Term Preterm Abortions TAB SAB Ect Mult Living   1 1        1        LIFESTYLE: Exercise:               Tobacco: no Alcohol:   no  Drugs:    no  Patient Active Problem List   Diagnosis Date  Noted  . Calculus of kidney 04/16/2013  . Urge incontinence 04/16/2013  . Urgency of micturation 04/16/2013  . Infection of urinary tract 04/16/2013    Past Medical History  Diagnosis Date  . Breast cyst     Aspirated  . Kidney stones 1999  . Chronic UTI   . Hematuria   . Spinal stenosis   . Nonpuerperal mastitis 11/91  . Fibroid   . OAB (overactive bladder)   . Insomnia   . Hepatitis     age 73    Past Surgical History  Procedure Laterality Date  . Tonsillectomy and adenoidectomy  1946  . Appendectomy  1959  . Tubal ligation  1979    BTL  . Cholecystectomy  1988  . Hysteroscopic resection  04/1999    small fibroid polyps  . Breast cyst aspiration    . Lithotripsy      stent    Current Outpatient Prescriptions  Medication Sig Dispense Refill  . citalopram (CELEXA) 20 MG tablet TAKE 1 TABLET BY MOUTH EVERY DAY  90 tablet  4  . fesoterodine (TOVIAZ) 8 MG TB24 tablet Take 1 tablet (8 mg total) by mouth daily.  30 tablet  2  . Multiple Vitamins-Minerals (EYE VITAMINS PO) Take by mouth daily.      . simvastatin (ZOCOR) 20 MG tablet  20 mg daily.       Marland Kitchen trimethoprim (TRIMPEX) 100 MG tablet Take 1 tablet (100 mg total) by mouth daily.  90 tablet  4   No current facility-administered medications for this visit.     ALLERGIES: Review of patient's allergies indicates no known allergies.  Family History  Problem Relation Age of Onset  . Breast cancer Mother 62  . Heart failure Father   . Emphysema Father   . Breast cancer Maternal Grandmother 31    History   Social History  . Marital Status: Married    Spouse Name: N/A    Number of Children: N/A  . Years of Education: N/A   Occupational History  . Not on file.   Social History Main Topics  . Smoking status: Never Smoker   . Smokeless tobacco: Never Used  . Alcohol Use: No  . Drug Use: No  . Sexual Activity: No   Other Topics Concern  . Not on file   Social History Narrative  . No narrative on file     ROS:  Pertinent items are noted in HPI.  PHYSICAL EXAMINATION:   Temp 98.7 BP 118/64  Pulse 80  Ht 5' 5.5" (1.664 m)  Wt 141 lb 6.4 oz (64.139 kg)  BMI 23.16 kg/m2  LMP 07/11/1999   Wt Readings from Last 3 Encounters:  12/26/13 141 lb 6.4 oz (64.139 kg)  12/15/13 143 lb (64.864 kg)  09/17/13 142 lb 9.6 oz (64.683 kg)     Ht Readings from Last 3 Encounters:  12/26/13 5' 5.5" (1.664 m)  12/15/13 5' 5.5" (1.664 m)  09/17/13 5' 5.5" (1.664 m)    General appearance: alert, cooperative and appears stated age Lungs: clear to auscultation bilaterally Back - No CVA tenderness. Heart: regular rate and rhythm Abdomen: soft, non-tender; no masses,  no organomegaly   ASSESSMENT  Dysuria.  Urine dip look positive for infection today.  Overactive bladder. On Vanessa Trujillo - just stopped acutely.  PLAN  Urine micro and culture.  Start Macrobid 100 mg po bid for 7 days.  Agree to stop Toviaz at least acutely while having current evaluation for infection, as it may be contributing to some urinary retention.  Hydrate. Warning signs of pyelonephritis discussed with patient.  Keep appointment for 12/29/13 for re-evaluation with Dr. Sabra Heck.  An After Visit Summary was printed and given to the patient.  20 minutes face to face time of which over 50% was spent in counseling.   After visit summary to patient.

## 2013-12-26 NOTE — Telephone Encounter (Signed)
Patient was seen by Milford Cage, FNP on 08/11/13 and had increased white blood cells in her urine was started on antibiotics and had TOC on 2/18 which was negative. Was instructed to complete antibiotics and follow up with [email or web address] in March for recurrent UTI. Patient had u/a with another doctor on 3/9 which was negative. At appointment in march with Dr.Miller patient was on trimethoprim for several months and was increased to 10mg  of vesicare. Patient was also given Cipro 500mg  bid for 14 days for vacation. Patient was then seen on 6/8 for a urine culture and micro which showed blood in the urine. Patient has cath UA scheduled with Dr.Miller for 6/22 but is calling today stating that she is having "UTI symptoms and is not sure what she should do." Appointment scheduled for today at 10:30am with Dr.Silva (time per Gay Filler). Patient is agreeable to date and time.  Routing to Dr.Silva CC: Dr.Miller

## 2013-12-26 NOTE — Patient Instructions (Signed)
Take the Macrobid 100 mg by mouth twice a day for one week. Drink plenty of water. Stop the Toviaz while we are treating what appears to be infection.  Keep your appointment with Dr. Sabra Heck for 12/29/13. Go to urgent care if you develop fever, nausea and vomiting, or back pain.

## 2013-12-26 NOTE — Telephone Encounter (Signed)
Patient thinks she has a UTI but has an appointment on Monday for Cath UA. Isnt sure what she needs to do. There arent any appointments to give. Its held for triage

## 2013-12-27 LAB — URINALYSIS, ROUTINE W REFLEX MICROSCOPIC
BILIRUBIN URINE: NEGATIVE
GLUCOSE, UA: NEGATIVE mg/dL
Ketones, ur: NEGATIVE mg/dL
Nitrite: POSITIVE — AB
PH: 6.5 (ref 5.0–8.0)
PROTEIN: 100 mg/dL — AB
Specific Gravity, Urine: 1.022 (ref 1.005–1.030)
Urobilinogen, UA: 1 mg/dL (ref 0.0–1.0)

## 2013-12-27 LAB — URINALYSIS, MICROSCOPIC ONLY
Casts: NONE SEEN
Crystals: NONE SEEN
Squamous Epithelial / LPF: NONE SEEN

## 2013-12-29 ENCOUNTER — Ambulatory Visit (INDEPENDENT_AMBULATORY_CARE_PROVIDER_SITE_OTHER): Payer: Medicare Other | Admitting: Obstetrics & Gynecology

## 2013-12-29 ENCOUNTER — Encounter: Payer: Self-pay | Admitting: Obstetrics & Gynecology

## 2013-12-29 VITALS — BP 110/62 | HR 68 | Temp 98.3°F | Resp 20 | Ht 65.5 in | Wt 141.0 lb

## 2013-12-29 DIAGNOSIS — N3000 Acute cystitis without hematuria: Secondary | ICD-10-CM

## 2013-12-29 DIAGNOSIS — R32 Unspecified urinary incontinence: Secondary | ICD-10-CM

## 2013-12-29 NOTE — Progress Notes (Signed)
Subjective:     Patient ID: Vanessa Trujillo, female   DOB: [birth year], 73 y.o.   MRN: [id number]  HPI 73 yo G1P1 DWF here for follow up after being seen 6/19 for UTI symptoms.  She has two major issues--incontinence which has been felt to be primarily urge incontinence.  She has been on Vesicare, Toviaz, [place], and Myrbetriq, generic oxybutynin.  She only one that does completely dry her out is Myrbetriq.  It is so expensive for her--is a Tier 3 medication.  I have submitted a request for tier exception which was recently denied.  Most recently, she was on the Toviaz 8mg .  She felt so "dry" with it.  Her mouth, in particular, was so dry.  She decided to stop this last week.  Her other issue is recurrent UTI symptoms.  She has been on suppressive therapy with Trimethoprim.  She has had several cultures this year--some are positive and some or negative when she has UTI symptoms.  Most recently (6/19) her culture was positive for E Coli.  Sensitivities are still pending.  She is on Macrobid and reports feeling much better.  She has used Cipro quite a bit in the past as well but now feels nausea when she takes it.    Pt seen by Dr. Alona Bene in October 2014.  Notes reviewed through [place] in [facility].  She underwent a CT and cystoscopy due to persistent microscopic hematuria.  She had some small renal cysts and what appears to be three small, non-obstructing stones.  Also, there was a smaller hypodense lesion in the left kidney that was not completely characterized but thought to be benign.  Of note, the patient has hx of ovarian cysts that have been followed on ultrasound as recently as 10/14.  Although there is some complexity to the cyst on the left ovary, it is stable and has been watched for several years.    Pt really wants something else done if possible.  She cannot remember having urodynamics done recnetly.  I do not know if there is a component of stress incontinence that could be repaired  that would improve Mrs. Swim's quality of life.     Review of Systems  All other systems reviewed and are negative.      Objective:   Physical Exam  Constitutional: She is oriented to person, place, and time. She appears well-developed and well-nourished.  Neurological: She is alert and oriented to person, place, and time.  Psychiatric: She has a normal mood and affect.   No other physical exam performed today.    Assessment:     UTI, improved OAB with incontinence     Plan:     Refer back to Dr. Amalia Hailey for consideration of urodynamics Finish macrobid bid x 7 days Urine sensitivities are pending.  Will give pt a copy of this and my note to take with her when she sees Dr. Amalia Hailey.  ~15 minutes spent with patient >50% of time was in face to face discussion of above.

## 2013-12-30 LAB — CULTURE, URINE COMPREHENSIVE: Colony Count: 40000

## 2014-01-02 NOTE — Addendum Note (Signed)
Addended by: Megan Salon on: 01/02/2014 10:53 AM   Modules accepted: Orders

## 2014-01-08 ENCOUNTER — Telehealth: Payer: Self-pay

## 2014-01-08 NOTE — Telephone Encounter (Signed)
Message copied by Robley Fries on Thu Jan 08, 2014  1:49 PM ------      Message from: Megan Salon      Created: Fri Jan 02, 2014 10:51 AM       She is going to go see Dr. Amalia Hailey again.  That was our plan at her last visit but she should have a repeat urine culture a week after finishing the antibiotics to ensure culture negative.  Please schedule.  I will place order. ------

## 2014-01-08 NOTE — Telephone Encounter (Signed)
Lmtcb//kn 

## 2014-01-13 NOTE — Telephone Encounter (Signed)
Patient aware. See result note//kn

## 2014-05-11 ENCOUNTER — Encounter: Payer: Self-pay | Admitting: Obstetrics & Gynecology

## 2014-06-02 ENCOUNTER — Ambulatory Visit: Payer: Medicare Other | Admitting: Obstetrics & Gynecology

## 2014-06-12 ENCOUNTER — Telehealth: Payer: Self-pay | Admitting: Certified Nurse Midwife

## 2014-06-27 ENCOUNTER — Other Ambulatory Visit: Payer: Self-pay | Admitting: Obstetrics & Gynecology

## 2014-06-29 NOTE — Telephone Encounter (Signed)
Medication refill request: Celexa 20mg  Last AEX:  05/08/13 Next AEX: NS Last Office Visit: 12/29/13 Last MMG (if hormonal medication request): NA Refill authorized: #90 day X 3

## 2014-07-24 ENCOUNTER — Encounter: Payer: Self-pay | Admitting: Obstetrics & Gynecology

## 2014-07-24 ENCOUNTER — Ambulatory Visit (INDEPENDENT_AMBULATORY_CARE_PROVIDER_SITE_OTHER): Payer: Medicare Other | Admitting: Obstetrics & Gynecology

## 2014-07-24 VITALS — BP 122/78 | HR 68 | Resp 16 | Ht 64.75 in | Wt 144.0 lb

## 2014-07-24 DIAGNOSIS — Z124 Encounter for screening for malignant neoplasm of cervix: Secondary | ICD-10-CM | POA: Diagnosis not present

## 2014-07-24 DIAGNOSIS — Z01419 Encounter for gynecological examination (general) (routine) without abnormal findings: Secondary | ICD-10-CM

## 2014-07-24 MED ORDER — NITROFURANTOIN MONOHYD MACRO 100 MG PO CAPS: 100.0000 mg | ORAL_CAPSULE | Freq: Every day | ORAL | Status: DC

## 2014-07-24 MED ORDER — CITALOPRAM HYDROBROMIDE 20 MG PO TABS: 20.0000 mg | ORAL_TABLET | Freq: Every day | ORAL | Status: DC

## 2014-07-24 NOTE — Progress Notes (Signed)
73 y.o. G1P1 Widowed CaucasianF here for annual exam.  Mother in law died 2023/06/27.  Patient has been care giving for several years.    Pt did have a UTI in December as well.  On suppression.  Patient had rx on hand and took this for a week.  Symptoms resolved.  Pt does have follow-up planned with Dr. Amalia Hailey.  Wants to proceed with urodynamics.  Wants to consider stopping her Citalopram.    PCP:  [name]   Patient's last menstrual period was 07/11/1999.          Sexually active: No.  The current method of family planning is post menopausal status.    Exercising: Yes.    yoga and water aerobics Smoker:  no  Health Maintenance: Pap:  01/28/10 WNL History of abnormal Pap:  yes MMG:  04/21/13 3D-normal Colonoscopy:  2/14-repeat in 5 years BMD:   04/21/13 TDaP:  Will check with Dr Osborne Casco Screening Labs: Dr Osborne Casco, Hb today: Dr Osborne Casco, Urine today: Dr Amalia Hailey   reports that she has never smoked. She has never used smokeless tobacco. She reports that she does not drink alcohol or use illicit drugs.  Past Medical History  Diagnosis Date  . Breast cyst     Aspirated  . Kidney stones 1999  . Chronic UTI   . Hematuria   . Spinal stenosis   . Nonpuerperal mastitis 11/91  . Fibroid   . OAB (overactive bladder)   . Insomnia   . Hepatitis     age 54    Past Surgical History  Procedure Laterality Date  . Tonsillectomy and adenoidectomy  1946  . Appendectomy  1959  . Tubal ligation  1979    BTL  . Cholecystectomy  1988  . Hysteroscopic resection  04/1999    small fibroid polyps  . Breast cyst aspiration    . Lithotripsy      stent    Current Outpatient Prescriptions  Medication Sig Dispense Refill  . Cholecalciferol (VITAMIN D PO) Take by mouth. 1000IU 2 x daily    . citalopram (CELEXA) 20 MG tablet TAKE 1 TABLET BY MOUTH EVERY DAY 90 tablet 2  . mirabegron ER (MYRBETRIQ) 50 MG TB24 tablet Take 1 tablet by mouth.    . nitrofurantoin, macrocrystal-monohydrate,  (MACROBID) 100 MG capsule Take 1 capsule (100 mg total) by mouth 2 (two) times daily. 14 capsule 0  . NON FORMULARY preservision adred 2 twice daily    . omeprazole (PRILOSEC) 20 MG capsule Take 20 mg by mouth daily.    . simvastatin (ZOCOR) 20 MG tablet 20 mg daily.      No current facility-administered medications for this visit.    Family History  Problem Relation Age of Onset  . Breast cancer Mother 75  . Heart failure Father   . Emphysema Father   . Breast cancer Maternal Grandmother 50    ROS:  Pertinent items are noted in HPI.  Otherwise, a comprehensive ROS was negative.  Exam:   BP 122/78 mmHg  Pulse 68  Resp 16  Ht 5' 4.75" (1.645 m)  Wt 144 lb (65.318 kg)  BMI 24.14 kg/m2  LMP 07/11/1999    Height: 5' 4.75" (164.5 cm)  Ht Readings from Last 3 Encounters:  07/24/14 5' 4.75" (1.645 m)  12/29/13 5' 5.5" (1.664 m)  12/26/13 5' 5.5" (1.664 m)    General appearance: alert, cooperative and appears stated age Head: Normocephalic, without obvious abnormality, atraumatic Neck: no adenopathy, supple,  symmetrical, trachea midline and thyroid normal to inspection and palpation Lungs: clear to auscultation bilaterally Breasts: normal appearance, no masses or tenderness Heart: regular rate and rhythm Abdomen: soft, non-tender; bowel sounds normal; no masses,  no organomegaly Extremities: extremities normal, atraumatic, no cyanosis or edema Skin: Skin color, texture, turgor normal. No rashes or lesions Lymph nodes: Cervical, supraclavicular, and axillary nodes normal. No abnormal inguinal nodes palpated Neurologic: Grossly normal   Pelvic: External genitalia:  no lesions              Urethra:  normal appearing urethra with no masses, tenderness or lesions              Bartholins and Skenes: normal                 Vagina: normal appearing vagina with normal color and discharge, no lesions, 1cm posterior vaginal cyst noted on BME              Cervix: no lesions               Pap taken: Yes.   Bimanual Exam:  Uterus:  normal size, contour, position, consistency, mobility, non-tender              Adnexa: normal adnexa and no mass, fullness, tenderness               Rectovaginal: Confirms               Anus:  normal sphincter tone, no lesions  Chaperone was present for exam.  A:  Well Woman with normal exam PMP, no HRT OAB H/O recurrent UTI's, on chronic suppression. Does see Dr. Amalia Hailey.  Had cystoscopy and CT 2014. Insomnia H/O ovarian cysts, last PUS 2014.  Was stable and has been watched for many years  P: Mammogram yearly pap smear 2011. Pap today. Labs with [name]. Citalopram 20mg  daily. Rx to pharmacy.  Pt will decrease to 10mg  for 2 months and see how she feels.  Then she will stop if she is feeling ok.   Nitrofurantoin 100mg  daily. Rx to pharmacy. Return annually or prn

## 2014-07-27 LAB — IPS PAP SMEAR ONLY

## 2014-08-06 DIAGNOSIS — H903 Sensorineural hearing loss, bilateral: Secondary | ICD-10-CM | POA: Diagnosis not present

## 2014-08-25 DIAGNOSIS — Z09 Encounter for follow-up examination after completed treatment for conditions other than malignant neoplasm: Secondary | ICD-10-CM | POA: Diagnosis not present

## 2014-08-25 DIAGNOSIS — N3941 Urge incontinence: Secondary | ICD-10-CM | POA: Diagnosis not present

## 2014-08-25 DIAGNOSIS — R3915 Urgency of urination: Secondary | ICD-10-CM | POA: Diagnosis not present

## 2014-09-09 DIAGNOSIS — J029 Acute pharyngitis, unspecified: Secondary | ICD-10-CM | POA: Diagnosis not present

## 2014-09-11 DIAGNOSIS — D1801 Hemangioma of skin and subcutaneous tissue: Secondary | ICD-10-CM | POA: Diagnosis not present

## 2014-09-11 DIAGNOSIS — D2271 Melanocytic nevi of right lower limb, including hip: Secondary | ICD-10-CM | POA: Diagnosis not present

## 2014-09-11 DIAGNOSIS — D225 Melanocytic nevi of trunk: Secondary | ICD-10-CM | POA: Diagnosis not present

## 2014-09-11 DIAGNOSIS — D2272 Melanocytic nevi of left lower limb, including hip: Secondary | ICD-10-CM | POA: Diagnosis not present

## 2014-09-11 DIAGNOSIS — L821 Other seborrheic keratosis: Secondary | ICD-10-CM | POA: Diagnosis not present

## 2014-09-21 DIAGNOSIS — E785 Hyperlipidemia, unspecified: Secondary | ICD-10-CM | POA: Diagnosis not present

## 2014-09-21 DIAGNOSIS — Z79899 Other long term (current) drug therapy: Secondary | ICD-10-CM | POA: Diagnosis not present

## 2014-09-28 DIAGNOSIS — Z1389 Encounter for screening for other disorder: Secondary | ICD-10-CM | POA: Diagnosis not present

## 2014-09-28 DIAGNOSIS — Z0000 Encounter for general adult medical examination without abnormal findings: Secondary | ICD-10-CM | POA: Diagnosis not present

## 2014-09-28 DIAGNOSIS — M791 Myalgia: Secondary | ICD-10-CM | POA: Diagnosis not present

## 2014-09-28 DIAGNOSIS — D126 Benign neoplasm of colon, unspecified: Secondary | ICD-10-CM | POA: Diagnosis not present

## 2014-09-28 DIAGNOSIS — E785 Hyperlipidemia, unspecified: Secondary | ICD-10-CM | POA: Diagnosis not present

## 2014-10-28 DIAGNOSIS — D126 Benign neoplasm of colon, unspecified: Secondary | ICD-10-CM | POA: Diagnosis not present

## 2014-10-28 DIAGNOSIS — Z1389 Encounter for screening for other disorder: Secondary | ICD-10-CM | POA: Diagnosis not present

## 2014-10-28 DIAGNOSIS — M791 Myalgia: Secondary | ICD-10-CM | POA: Diagnosis not present

## 2014-10-28 DIAGNOSIS — E785 Hyperlipidemia, unspecified: Secondary | ICD-10-CM | POA: Diagnosis not present

## 2014-10-28 DIAGNOSIS — Z1231 Encounter for screening mammogram for malignant neoplasm of breast: Secondary | ICD-10-CM | POA: Diagnosis not present

## 2015-04-09 ENCOUNTER — Telehealth: Payer: Self-pay | Admitting: Obstetrics & Gynecology

## 2015-04-09 DIAGNOSIS — Z87440 Personal history of urinary (tract) infections: Secondary | ICD-10-CM

## 2015-04-09 NOTE — Telephone Encounter (Signed)
Referral to [facility] placed for history of recurrent UTI's. Spoke with patient. Advised referral has been placed. Advised referrals coordinator in our office will be in contact with her or [facility] will contact her directly regarding date and time of appointment. Patient is agreeable.  Cc: Theresia Lo  Routing to provider for final review. Patient agreeable to disposition. Will close encounter.

## 2015-04-09 NOTE — Telephone Encounter (Signed)
Spoke with patient. Patient states that she is seen with Dr.Evans at [facility] for recurrent UTI's. "I feel like he may have done everything he can for me. I would like another opinion from someone locally to see if they can help." Requesting a referral to a urologist in [place]. Denies any current UTI symptoms. Advised I will speak with covering provider regarding referral and return call. Patient is agreeable.  Dr.Silva, okay to refer over to [facility]?

## 2015-04-09 NOTE — Telephone Encounter (Signed)
[place]ton for referral to [facility].  Thank you!

## 2015-04-09 NOTE — Telephone Encounter (Signed)
Patient would like a referral to a urologist.

## 2015-04-14 ENCOUNTER — Telehealth: Payer: Self-pay | Admitting: Obstetrics & Gynecology

## 2015-04-14 ENCOUNTER — Encounter: Payer: Self-pay | Admitting: Obstetrics and Gynecology

## 2015-04-14 ENCOUNTER — Ambulatory Visit (INDEPENDENT_AMBULATORY_CARE_PROVIDER_SITE_OTHER): Payer: Medicare Other | Admitting: Obstetrics and Gynecology

## 2015-04-14 VITALS — BP 122/80 | HR 64 | Temp 98.2°F | Resp 14 | Wt 142.0 lb

## 2015-04-14 DIAGNOSIS — N390 Urinary tract infection, site not specified: Secondary | ICD-10-CM | POA: Diagnosis not present

## 2015-04-14 DIAGNOSIS — M791 Myalgia, unspecified site: Secondary | ICD-10-CM

## 2015-04-14 DIAGNOSIS — R8290 Unspecified abnormal findings in urine: Secondary | ICD-10-CM

## 2015-04-14 DIAGNOSIS — N3281 Overactive bladder: Secondary | ICD-10-CM

## 2015-04-14 LAB — POCT URINALYSIS DIPSTICK
BILIRUBIN UA: NEGATIVE
Glucose, UA: NEGATIVE
KETONES UA: NEGATIVE
Nitrite, UA: NEGATIVE
PH UA: 5
Protein, UA: NEGATIVE
Urobilinogen, UA: NEGATIVE

## 2015-04-14 MED ORDER — CYCLOBENZAPRINE HCL 5 MG PO TABS: 5.0000 mg | ORAL_TABLET | Freq: Three times a day (TID) | ORAL | Status: DC | PRN

## 2015-04-14 NOTE — Progress Notes (Signed)
GYNECOLOGY  VISIT   HPI: 74 y.o.   Married  Caucasian  female   G1P1 with Patient's last menstrual period was 07/11/1999.   here for  urinary odor, lower back pain. She has noticed the urinary odor for the last week. Starting yesterday she developed lower back pain, constant, 6-9/10 in severity. Hurts to move and bend. She has a h/o a small kidney stone, not sure which side. No dysuria, no increase in her baseline frequency or urgency to void. She has an appointment to see a urologist. She is on myrbetric for OAB.  No fevers, feels flushed at times. She is on daily macrobid for UTI suppression.   GYNECOLOGIC HISTORY: Patient's last menstrual period was 07/11/1999. Contraception: postmenopausal Menopausal hormone therapy: none        OB History    Gravida Para Term Preterm AB TAB SAB Ectopic Multiple Living   1 1        1          Patient Active Problem List   Diagnosis Date Noted  . Calculus of kidney 04/16/2013  . Urge incontinence 04/16/2013  . Urgency of micturation 04/16/2013  . Infection of urinary tract 04/16/2013    Past Medical History  Diagnosis Date  . Breast cyst     Aspirated  . Kidney stones 1999  . Chronic UTI   . Hematuria   . Spinal stenosis   . Nonpuerperal mastitis 11/91  . Fibroid   . OAB (overactive bladder)   . Insomnia   . Hepatitis     age 77    Past Surgical History  Procedure Laterality Date  . Tonsillectomy and adenoidectomy  1946  . Appendectomy  1959  . Tubal ligation  1979    BTL  . Cholecystectomy  1988  . Hysteroscopic resection  04/1999    small fibroid polyps  . Breast cyst aspiration    . Lithotripsy      stent    Current Outpatient Prescriptions  Medication Sig Dispense Refill  . citalopram (CELEXA) 20 MG tablet Take 1 tablet (20 mg total) by mouth daily. 90 tablet 4  . mirabegron ER (MYRBETRIQ) 50 MG TB24 tablet Take 1 tablet by mouth.    . nitrofurantoin, macrocrystal-monohydrate, (MACROBID) 100 MG capsule Take 1 capsule  (100 mg total) by mouth daily. 30 capsule 13  . NON FORMULARY preservision adred 2 twice daily    . omeprazole (PRILOSEC) 20 MG capsule Take 20 mg by mouth daily.    . Cholecalciferol (VITAMIN D PO) Take by mouth. 1000IU 2 x daily    . simvastatin (ZOCOR) 20 MG tablet 20 mg daily.      No current facility-administered medications for this visit.     ALLERGIES: Review of patient's allergies indicates no known allergies.  Family History  Problem Relation Age of Onset  . Breast cancer Mother 28  . Heart failure Father   . Emphysema Father   . Breast cancer Maternal Grandmother 80    Social History   Social History  . Marital Status: Married    Spouse Name: N/A  . Number of Children: N/A  . Years of Education: N/A   Occupational History  . Not on file.   Social History Main Topics  . Smoking status: Never Smoker   . Smokeless tobacco: Never Used  . Alcohol Use: No  . Drug Use: No  . Sexual Activity: No   Other Topics Concern  . Not on file   Social History  Narrative    ROS  No change in urinary frequency or urgency. See HPI, otherwise comprehensive ROS was negative.   PHYSICAL EXAMINATION:    BP 122/80 mmHg  Pulse 64  Temp(Src) 98.2 F (36.8 C)  Resp 14  Wt 142 lb (64.411 kg)  LMP 07/11/1999    General appearance: alert, cooperative and appears stated age Abdomen: soft, non-tender; no masses Back: no CVA tenderness, no real tenderness with palpation of her lower back  ASSESSMENT Urinary odor Urine with 2+ leuk, trace blood H/O recurrent UTI on daily macrobid, no other urinary changes Back pain, cw MS etiology  PLAN Send urine for ua, c&s She is taking NSAID's for her pain Will add Flexeril Consider therapeutic massage or seeing a chiropractor  The patient will call on Friday for her urine culture results   An After Visit Summary was printed and given to the patient.

## 2015-04-14 NOTE — Telephone Encounter (Signed)
Patient coming in today at 74 for lab work she said she will speak to Kaka then.

## 2015-04-14 NOTE — Patient Instructions (Signed)
Call on Friday for urine culture results  Back Pain, Adult Back pain is very common in adults.The cause of back pain is rarely dangerous and the pain often gets better over time.The cause of your back pain may not be known. Some common causes of back pain include:  Strain of the muscles or ligaments supporting the spine.  Wear and tear (degeneration) of the spinal disks.  Arthritis.  Direct injury to the back. For many people, back pain may return. Since back pain is rarely dangerous, most people can learn to manage this condition on their own. HOME CARE INSTRUCTIONS Watch your back pain for any changes. The following actions may help to lessen any discomfort you are feeling:  Remain active. It is stressful on your back to sit or stand in one place for long periods of time. Do not sit, drive, or stand in one place for more than 30 minutes at a time. Take short walks on even surfaces as soon as you are able.Try to increase the length of time you walk each day.  Exercise regularly as directed by your health care provider. Exercise helps your back heal faster. It also helps avoid future injury by keeping your muscles strong and flexible.  Do not stay in bed.Resting more than 1-2 days can delay your recovery.  Pay attention to your body when you bend and lift. The most comfortable positions are those that put less stress on your recovering back. Always use proper lifting techniques, including:  Bending your knees.  Keeping the load close to your body.  Avoiding twisting.  Find a comfortable position to sleep. Use a firm mattress and lie on your side with your knees slightly bent. If you lie on your back, put a pillow under your knees.  Avoid feeling anxious or stressed.Stress increases muscle tension and can worsen back pain.It is important to recognize when you are anxious or stressed and learn ways to manage it, such as with exercise.  Take medicines only as directed by your  health care provider. Over-the-counter medicines to reduce pain and inflammation are often the most helpful.Your health care provider may prescribe muscle relaxant drugs.These medicines help dull your pain so you can more quickly return to your normal activities and healthy exercise.  Apply ice to the injured area:  Put ice in a plastic bag.  Place a towel between your skin and the bag.  Leave the ice on for 20 minutes, 2-3 times a day for the first 2-3 days. After that, ice and heat may be alternated to reduce pain and spasms.  Maintain a healthy weight. Excess weight puts extra stress on your back and makes it difficult to maintain good posture. SEEK MEDICAL CARE IF:  You have pain that is not relieved with rest or medicine.  You have increasing pain going down into the legs or buttocks.  You have pain that does not improve in one week.  You have night pain.  You lose weight.  You have a fever or chills. SEEK IMMEDIATE MEDICAL CARE IF:   You develop new bowel or bladder control problems.  You have unusual weakness or numbness in your arms or legs.  You develop nausea or vomiting.  You develop abdominal pain.  You feel faint.   This information is not intended to replace advice given to you by your health care provider. Make sure you discuss any questions you have with your health care provider.   Document Released: 06/26/2005 Document Revised: 07/17/2014 Document  Reviewed: 10/28/2013 Elsevier Interactive Patient Education [employer].

## 2015-04-14 NOTE — Telephone Encounter (Signed)
Routing to provider. Will close encounter.  Cc: Vanessa Trujillo  Routing to provider for final review. Patient agreeable to disposition. Will close encounter.

## 2015-04-14 NOTE — Telephone Encounter (Signed)
Late entry for 0854. Spoke with patient. She complains of two days of lower back pain and strong odor to her urine. Has increased fluids without improvement of symptoms. No blood in urine, no nausea, no vomiting, no dysuria. Patient states she has a history of kidney stones but this was about 20 years ago. Patient with history of frequent UTI's.  Referral in process to [facility].  Office visit today with Dr. Talbert Nan at 1100 to rule out UTI. Patient agreeable.  Message has been routed to Dr. Sabra Heck.

## 2015-04-14 NOTE — Telephone Encounter (Signed)
Patient thinks she may be trying to pass a kidney stone. Has not gotten a call from the urologist to schedule an appointment.

## 2015-04-15 LAB — URINALYSIS, MICROSCOPIC ONLY
BACTERIA UA: NONE SEEN [HPF]
Casts: NONE SEEN [LPF]
RBC / HPF: NONE SEEN RBC/HPF (ref <=2)
YEAST: NONE SEEN [HPF]

## 2015-04-16 ENCOUNTER — Telehealth: Payer: Self-pay | Admitting: Emergency Medicine

## 2015-04-16 NOTE — Telephone Encounter (Signed)
Check results again, continued re incubation.

## 2015-04-16 NOTE — Telephone Encounter (Signed)
Reviewed culture at this time. Culture re incubated for better growth.

## 2015-04-16 NOTE — Telephone Encounter (Signed)
-----   Message from Salvadore Dom, MD sent at 04/14/2015 12:01 PM EDT ----- Please check urine culture results on Friday. Thanks!

## 2015-04-17 LAB — URINE CULTURE

## 2015-04-19 NOTE — Telephone Encounter (Signed)
At the time of her visit her back pain was in her lower back and cw MS etiology. We would be happy to see her again if she is concerned that it is something else.

## 2015-04-19 NOTE — Telephone Encounter (Signed)
Call to patient. She is currently on Macrobid 100 mg daily. Advised patient per note from Dr. Talbert Nan to increase to BID x 7 days. Patient states she still feels like she has back pain, she is scheduled to see a massage therapist tomorrow to see if it helps with back pain. Patient does not have PCP. She is advised to seek care at urgent care or emergency department if symptoms increase or develops fevers, chills, nausea, vomiting or increased back pain.  Patient verbalized understanding and will follow up as scheduled.  Routing to Dr. Talbert Nan for review as patient still c/o lower back pain. Patient has urology referral scheduled to see [name] 05/28/15.

## 2015-04-19 NOTE — Telephone Encounter (Signed)
-----   Message from Salvadore Dom, MD sent at 04/19/2015  4:04 PM EDT ----- Please inform the patient that her urine did come back showing an infection and treat with macrobid 100 mg BID x 7 days. Please see how she is feeling.  Thanks

## 2015-04-20 NOTE — Telephone Encounter (Signed)
Porcupine in regards to her back pain -eh

## 2015-04-22 NOTE — Telephone Encounter (Signed)
Message left to return call to Bristal Steffy at [phone].    

## 2015-04-22 NOTE — Telephone Encounter (Signed)
Return call to patient. Reports back pain is better but not resolved. She went to massage therapist on 04/20/2015 and feels that did help. Overall she reports does not feel great. Thinks this may be related to Macrobid twice a day. Reports she is surprisedurine culture is positive since this is not her usual presentation for UTI. Reports she has had multiple UTIs in the past that have never felt like this. Advised to complete antibiotics and call back with update at the beginning of next week or if symptoms worsen prior. Physician can determine follow-up based on symptoms at that time.  Will review with provider and call back if additional instructions.   Dr Talbert Nan out of office. Routing to Dr Sabra Heck, patients usual provider.   Ok to close encounter?

## 2015-04-22 NOTE — Telephone Encounter (Signed)
Patient returning call.

## 2015-04-23 NOTE — Telephone Encounter (Signed)
Her urine micro showed calcium oxalate crystals.  Anyway this pain could be a stone?  CT is best way for diagnosis.

## 2015-04-27 NOTE — Telephone Encounter (Signed)
Message left to return call to Vanessa Trujillo at [phone].    

## 2015-04-28 NOTE — Telephone Encounter (Signed)
Patient returned call. She states she completed Macrobid and feels much improved. Back pain has resolved.  Patient states she will follow up with [facility] that is scheduled and will return call as needed if any new symptoms.  Routing to provider for final review. Patient agreeable to disposition. Will close encounter.

## 2015-05-03 DIAGNOSIS — H2511 Age-related nuclear cataract, right eye: Secondary | ICD-10-CM | POA: Diagnosis not present

## 2015-05-03 DIAGNOSIS — H353131 Nonexudative age-related macular degeneration, bilateral, early dry stage: Secondary | ICD-10-CM | POA: Diagnosis not present

## 2015-05-03 DIAGNOSIS — H35412 Lattice degeneration of retina, left eye: Secondary | ICD-10-CM | POA: Diagnosis not present

## 2015-05-03 DIAGNOSIS — H40013 Open angle with borderline findings, low risk, bilateral: Secondary | ICD-10-CM | POA: Diagnosis not present

## 2015-05-10 ENCOUNTER — Other Ambulatory Visit: Payer: Self-pay | Admitting: Obstetrics & Gynecology

## 2015-05-31 DIAGNOSIS — N3281 Overactive bladder: Secondary | ICD-10-CM | POA: Diagnosis not present

## 2015-06-08 DIAGNOSIS — N3941 Urge incontinence: Secondary | ICD-10-CM | POA: Diagnosis not present

## 2015-06-08 DIAGNOSIS — R3915 Urgency of urination: Secondary | ICD-10-CM | POA: Diagnosis not present

## 2015-06-11 DIAGNOSIS — M6281 Muscle weakness (generalized): Secondary | ICD-10-CM | POA: Diagnosis not present

## 2015-06-11 DIAGNOSIS — M62838 Other muscle spasm: Secondary | ICD-10-CM | POA: Diagnosis not present

## 2015-06-11 DIAGNOSIS — R278 Other lack of coordination: Secondary | ICD-10-CM | POA: Diagnosis not present

## 2015-06-11 DIAGNOSIS — N3946 Mixed incontinence: Secondary | ICD-10-CM | POA: Diagnosis not present

## 2015-06-16 DIAGNOSIS — N3281 Overactive bladder: Secondary | ICD-10-CM | POA: Diagnosis not present

## 2015-08-19 ENCOUNTER — Other Ambulatory Visit: Payer: Self-pay | Admitting: Obstetrics & Gynecology

## 2015-08-19 NOTE — Telephone Encounter (Signed)
Medication refill request: Macrobid/ Celexa Last AEX:  07-24-14 Next AEX: 09-21-15 Last MMG (if hormonal medication request): [phone] WNL  Refill authorized: please advise

## 2015-08-20 NOTE — Telephone Encounter (Signed)
Patient calling checking on the status of these refills if this could be done before the weekend patient would really appreciate it.

## 2015-08-20 NOTE — Telephone Encounter (Signed)
Please route these Rx requests to Dr. Sabra Heck.  Thanks!

## 2015-08-25 NOTE — Telephone Encounter (Signed)
Lm on patient's vm that rx's have been sent.

## 2015-09-08 HISTORY — PX: CATARACT EXTRACTION W/ INTRAOCULAR LENS IMPLANT: SHX1309

## 2015-09-08 HISTORY — PX: CATARACT EXTRACTION: SUR2

## 2015-09-21 ENCOUNTER — Ambulatory Visit (INDEPENDENT_AMBULATORY_CARE_PROVIDER_SITE_OTHER): Payer: Medicare Other | Admitting: Obstetrics & Gynecology

## 2015-09-21 ENCOUNTER — Encounter: Payer: Self-pay | Admitting: Obstetrics & Gynecology

## 2015-09-21 VITALS — BP 120/66 | HR 88 | Resp 16 | Ht 64.5 in | Wt 142.0 lb

## 2015-09-21 DIAGNOSIS — Z01419 Encounter for gynecological examination (general) (routine) without abnormal findings: Secondary | ICD-10-CM | POA: Diagnosis not present

## 2015-09-21 MED ORDER — CITALOPRAM HYDROBROMIDE 20 MG PO TABS: 20.0000 mg | ORAL_TABLET | Freq: Every day | ORAL | Status: DC

## 2015-09-21 NOTE — Progress Notes (Addendum)
75 y.o. G1P1 MarriedCaucasianF here for annual exam.  Doing well.  States she "just getting old".  Pt reports seeing [name] this year about her bladder.  She did have urodynamics done.  Seeing one of the physical therapists at [facility].  Did switch the nitrofurantoin to Trimethoprim.   Denies vaginal bleeding.    PCP:  [name]  Patient's last menstrual period was 07/11/1999.          Sexually active: No.  The current method of family planning is post menopausal status.    Exercising: Yes.    Water aerobics 2 x weekly, walking Smoker:  no  Health Maintenance: Pap:  07/24/14 Neg History of abnormal Pap:  yes MMG:  10/28/14 BIRADS1:neg Colonoscopy:  04/08/12 Polyps  BMD:   04/21/13 Normal  TDaP:  unsure Screening Labs: PCP, Urine today: [facility]. [name]   reports that she has never smoked. She has never used smokeless tobacco. She reports that she does not drink alcohol or use illicit drugs.  Past Medical History  Diagnosis Date  . Breast cyst     Aspirated  . Kidney stones 1999  . Chronic UTI   . Hematuria   . Spinal stenosis   . Nonpuerperal mastitis 11/91  . Fibroid   . OAB (overactive bladder)   . Insomnia   . Hepatitis     age 72  . TMJ (sprain of temporomandibular joint)     Past Surgical History  Procedure Laterality Date  . Tonsillectomy and adenoidectomy  1946  . Appendectomy  1959  . Tubal ligation  1979    BTL  . Cholecystectomy  1988  . Hysteroscopic resection  04/1999    small fibroid polyps  . Breast cyst aspiration    . Lithotripsy      stent  . Cataract extraction Right 09/2015    Current Outpatient Prescriptions  Medication Sig Dispense Refill  . Cholecalciferol (VITAMIN D PO) Take by mouth. 1000IU 2 x daily    . citalopram (CELEXA) 20 MG tablet TAKE 1 TABLET (20 MG TOTAL) BY MOUTH DAILY. 90 tablet 2  . Difluprednate (DUREZOL) 0.05 % EMUL Apply to eye daily.    . mirabegron ER (MYRBETRIQ) 50 MG TB24 tablet Take 1  tablet by mouth.    . Multiple Vitamins-Minerals (PRESERVISION AREDS 2) CAPS Take by mouth daily.    Marland Kitchen omeprazole (PRILOSEC) 20 MG capsule Take 20 mg by mouth daily.    Marland Kitchen PROLENSA 0.07 % SOLN INSTILL 1 DROP INTO RIGHT EYE EVERY DAY STARTING ON 09/12/15 AND CONTINUE UNTIL GONE  0  . trimethoprim (TRIMPEX) 100 MG tablet Take 100 mg by mouth at bedtime.  1   No current facility-administered medications for this visit.    Family History  Problem Relation Age of Onset  . Breast cancer Mother 53  . Heart failure Father   . Emphysema Father   . Breast cancer Maternal Grandmother 50    ROS:  Pertinent items are noted in HPI.  Otherwise, a comprehensive ROS was negative.  Exam:   BP 120/66 mmHg  Pulse 88  Resp 16  Ht 5' 4.5" (1.638 m)  Wt 142 lb (64.411 kg)  BMI 24.01 kg/m2  LMP 07/11/1999  Weight change: -2#   Height: 5' 4.5" (163.8 cm)  Ht Readings from Last 3 Encounters:  09/21/15 5' 4.5" (1.638 m)  07/24/14 5' 4.75" (1.645 m)  12/29/13 5' 5.5" (1.664 m)    General appearance: alert, cooperative and appears  stated age Head: Normocephalic, without obvious abnormality, atraumatic Neck: no adenopathy, supple, symmetrical, trachea midline and thyroid normal to inspection and palpation Lungs: clear to auscultation bilaterally Breasts: normal appearance, no masses or tenderness Heart: regular rate and rhythm Abdomen: soft, non-tender; bowel sounds normal; no masses,  no organomegaly Extremities: extremities normal, atraumatic, no cyanosis or edema Skin: Skin color, texture, turgor normal. No rashes or lesions Lymph nodes: Cervical, supraclavicular, and axillary nodes normal. No abnormal inguinal nodes palpated Neurologic: Grossly normal   Pelvic: External genitalia:  no lesions              Urethra:  normal appearing urethra with no masses, tenderness or lesions              Bartholins and Skenes: normal                 Vagina: normal appearing vagina with normal color and  discharge, no lesions              Cervix: no lesions              Pap taken: No. Bimanual Exam:  Uterus:  normal size, contour, position, consistency, mobility, non-tender              Adnexa: normal adnexa and no mass, fullness, tenderness               Rectovaginal: Confirms               Anus:  normal sphincter tone, no lesions  Chaperone was present for exam.  A:  Well Woman with normal exam PMP, no HRT OAB H/O recurrent UTI's, on chronic suppression. Seeing Dr. Hartley Barefoot now. Had cystoscopy and CT 2014. Insomnia H/O ovarian cysts, last PUS 2014. Was stable and has been watched for many years.  Have stopped ultrasounds.  P: Mammogram yearly pap smear 2011. Pap 2016.  No pap today. Labs with [name].  Will have release for vaccinations signed today Citalopram 20mg  daily. Rx to pharmacy. Return annually or prn  10/14/15.  Outside records from PCP obtained.  Last date of tetanus is not known.  Only documentation is form 2013 stating "has been done within the last 10 years".

## 2015-10-14 NOTE — Addendum Note (Signed)
Addended by: Megan Salon on: 10/14/2015 07:30 AM   Modules accepted: Miquel Dunn

## 2015-12-29 ENCOUNTER — Encounter: Payer: Self-pay | Admitting: Obstetrics & Gynecology

## 2016-06-16 ENCOUNTER — Observation Stay (HOSPITAL_COMMUNITY)
Admission: EM | Admit: 2016-06-16 | Discharge: 2016-06-18 | Disposition: A | Discharge status: Home / Self Care | Payer: Medicare Other | Attending: Internal Medicine | Admitting: Internal Medicine

## 2016-06-16 ENCOUNTER — Emergency Department (HOSPITAL_COMMUNITY): Payer: Medicare Other

## 2016-06-16 ENCOUNTER — Encounter (HOSPITAL_COMMUNITY): Payer: Self-pay | Admitting: Emergency Medicine

## 2016-06-16 DIAGNOSIS — F32A Depression, unspecified: Secondary | ICD-10-CM | POA: Diagnosis present

## 2016-06-16 DIAGNOSIS — R079 Chest pain, unspecified: Principal | ICD-10-CM | POA: Diagnosis present

## 2016-06-16 DIAGNOSIS — R059 Cough, unspecified: Secondary | ICD-10-CM | POA: Diagnosis present

## 2016-06-16 DIAGNOSIS — Z79899 Other long term (current) drug therapy: Secondary | ICD-10-CM | POA: Diagnosis not present

## 2016-06-16 DIAGNOSIS — K219 Gastro-esophageal reflux disease without esophagitis: Secondary | ICD-10-CM | POA: Diagnosis present

## 2016-06-16 DIAGNOSIS — F329 Major depressive disorder, single episode, unspecified: Secondary | ICD-10-CM | POA: Diagnosis present

## 2016-06-16 DIAGNOSIS — R739 Hyperglycemia, unspecified: Secondary | ICD-10-CM | POA: Diagnosis present

## 2016-06-16 DIAGNOSIS — R05 Cough: Secondary | ICD-10-CM | POA: Diagnosis present

## 2016-06-16 HISTORY — DX: Gastro-esophageal reflux disease without esophagitis: K21.9

## 2016-06-16 HISTORY — DX: Chest pain, unspecified: R07.9

## 2016-06-16 LAB — URINALYSIS, ROUTINE W REFLEX MICROSCOPIC
Bilirubin Urine: NEGATIVE
GLUCOSE, UA: NEGATIVE mg/dL
HGB URINE DIPSTICK: NEGATIVE
KETONES UR: NEGATIVE mg/dL
LEUKOCYTES UA: NEGATIVE
Nitrite: NEGATIVE
PROTEIN: NEGATIVE mg/dL
Specific Gravity, Urine: 1.025 (ref 1.005–1.030)
pH: 5 (ref 5.0–8.0)

## 2016-06-16 LAB — CBC WITH DIFFERENTIAL/PLATELET
Basophils Absolute: 0 10*3/uL (ref 0.0–0.1)
Basophils Relative: 1 %
Eosinophils Absolute: 0.5 10*3/uL (ref 0.0–0.7)
Eosinophils Relative: 6 %
HEMATOCRIT: 38.1 % (ref 36.0–46.0)
Hemoglobin: 12.5 g/dL (ref 12.0–15.0)
LYMPHS ABS: 2.2 10*3/uL (ref 0.7–4.0)
LYMPHS PCT: 27 %
MCH: 29.9 pg (ref 26.0–34.0)
MCHC: 32.8 g/dL (ref 30.0–36.0)
MCV: 91.1 fL (ref 78.0–100.0)
MONO ABS: 1 10*3/uL (ref 0.1–1.0)
MONOS PCT: 12 %
NEUTROS ABS: 4.5 10*3/uL (ref 1.7–7.7)
Neutrophils Relative %: 54 %
Platelets: 254 10*3/uL (ref 150–400)
RBC: 4.18 MIL/uL (ref 3.87–5.11)
RDW: 13.1 % (ref 11.5–15.5)
WBC: 8.2 10*3/uL (ref 4.0–10.5)

## 2016-06-16 LAB — COMPREHENSIVE METABOLIC PANEL
ALT: 28 U/L (ref 14–54)
ANION GAP: 6 (ref 5–15)
AST: 52 U/L — AB (ref 15–41)
Albumin: 3.1 g/dL — ABNORMAL LOW (ref 3.5–5.0)
Alkaline Phosphatase: 92 U/L (ref 38–126)
BILIRUBIN TOTAL: 0.6 mg/dL (ref 0.3–1.2)
BUN: 19 mg/dL (ref 6–20)
CO2: 28 mmol/L (ref 22–32)
Calcium: 8.9 mg/dL (ref 8.9–10.3)
Chloride: 104 mmol/L (ref 101–111)
Creatinine, Ser: 0.61 mg/dL (ref 0.44–1.00)
GFR calc Af Amer: >60 mL/min (ref >60)
Glucose, Bld: 140 mg/dL — ABNORMAL HIGH (ref 65–99)
POTASSIUM: 3.9 mmol/L (ref 3.5–5.1)
Sodium: 138 mmol/L (ref 135–145)
TOTAL PROTEIN: 6.1 g/dL — AB (ref 6.5–8.1)

## 2016-06-16 LAB — TROPONIN I: TROPONIN I: 0.05 ng/mL — AB (ref <0.03)

## 2016-06-16 LAB — LIPASE, BLOOD: LIPASE: 30 U/L (ref 11–51)

## 2016-06-16 LAB — I-STAT TROPONIN, ED: Troponin i, poc: 0 ng/mL (ref 0.00–0.08)

## 2016-06-16 MED ORDER — MORPHINE SULFATE (PF) 4 MG/ML IV SOLN: 2.0000 mg | INTRAVENOUS | Status: DC | PRN

## 2016-06-16 MED ORDER — CITALOPRAM HYDROBROMIDE 20 MG PO TABS
20.0000 mg | ORAL_TABLET | Freq: Every day | ORAL | Status: DC
  Administered 2016-06-16 – 2016-06-17 (×2): 20 mg via ORAL
  Filled 2016-06-16: qty 1
  Filled 2016-06-16: qty 2

## 2016-06-16 MED ORDER — HYDROCOD POLST-CPM POLST ER 10-8 MG/5ML PO SUER
5.0000 mL | Freq: Two times a day (BID) | ORAL | Status: DC | PRN
  Administered 2016-06-16: 5 mL via ORAL
  Filled 2016-06-16: qty 5

## 2016-06-16 MED ORDER — ACETAMINOPHEN 325 MG PO TABS: 650.0000 mg | ORAL_TABLET | ORAL | Status: DC | PRN

## 2016-06-16 MED ORDER — DIFLUPREDNATE 0.05 % OP EMUL: Freq: Every day | OPHTHALMIC | Status: DC

## 2016-06-16 MED ORDER — MIRABEGRON ER 25 MG PO TB24
50.0000 mg | ORAL_TABLET | Freq: Every day | ORAL | Status: DC
  Filled 2016-06-16 (×2): qty 1

## 2016-06-16 MED ORDER — MENTHOL 3 MG MT LOZG
1.0000 | LOZENGE | OROMUCOSAL | Status: DC | PRN
Start: 2016-06-16 — End: 2016-06-18
  Filled 2016-06-16: qty 9

## 2016-06-16 MED ORDER — CEPASTAT 14.5 MG MT LOZG
1.0000 | LOZENGE | OROMUCOSAL | Status: DC | PRN
  Filled 2016-06-16: qty 9

## 2016-06-16 MED ORDER — ASPIRIN EC 325 MG PO TBEC: 325.0000 mg | DELAYED_RELEASE_TABLET | Freq: Every day | ORAL | Status: DC

## 2016-06-16 MED ORDER — ONDANSETRON HCL 4 MG/2ML IJ SOLN: 4.0000 mg | Freq: Four times a day (QID) | INTRAMUSCULAR | Status: DC | PRN

## 2016-06-16 MED ORDER — ENOXAPARIN SODIUM 40 MG/0.4ML ~~LOC~~ SOLN: 40.0000 mg | SUBCUTANEOUS | Status: DC

## 2016-06-16 MED ORDER — TRIMETHOPRIM 100 MG PO TABS: 100.0000 mg | ORAL_TABLET | Freq: Every day | ORAL | Status: DC

## 2016-06-16 MED ORDER — PANTOPRAZOLE SODIUM 40 MG PO TBEC
40.0000 mg | DELAYED_RELEASE_TABLET | Freq: Two times a day (BID) | ORAL | Status: DC
  Administered 2016-06-16 – 2016-06-17 (×3): 40 mg via ORAL
  Filled 2016-06-16 (×3): qty 1

## 2016-06-16 MED ORDER — ASPIRIN EC 325 MG PO TBEC
325.0000 mg | DELAYED_RELEASE_TABLET | Freq: Every day | ORAL | Status: DC
  Administered 2016-06-17: 325 mg via ORAL
  Filled 2016-06-16: qty 1

## 2016-06-16 MED ORDER — GI COCKTAIL ~~LOC~~
30.0000 mL | Freq: Four times a day (QID) | ORAL | Status: DC | PRN
  Administered 2016-06-16: 30 mL via ORAL
  Filled 2016-06-16: qty 30

## 2016-06-16 MED ORDER — PANTOPRAZOLE SODIUM 40 MG PO TBEC: 40.0000 mg | DELAYED_RELEASE_TABLET | Freq: Every day | ORAL | Status: DC

## 2016-06-16 MED ORDER — GI COCKTAIL ~~LOC~~
30.0000 mL | Freq: Once | ORAL | Status: AC
  Administered 2016-06-16: 30 mL via ORAL
  Filled 2016-06-16: qty 30

## 2016-06-16 MED ORDER — VITAMIN D 1000 UNITS PO TABS
1000.0000 [IU] | ORAL_TABLET | Freq: Every day | ORAL | Status: DC
  Administered 2016-06-16: 1000 [IU] via ORAL
  Filled 2016-06-16: qty 1

## 2016-06-16 NOTE — ED Triage Notes (Signed)
Pt from home via [facility]. Pt c/o of cough & cold x1wk. Sts she woke up this morning w/ sudden onset CP that starts in the center of her chest and radiates to her back and up her neck. Rates pain @ 7/10. Endorses nausea but no vomiting. Denis any other s/s or hx of heart related issues. Pt A&O x4 on arrival.

## 2016-06-16 NOTE — ED Notes (Signed)
Admitting at bedside 

## 2016-06-16 NOTE — H&P (Signed)
History and Physical    Vanessa Trujillo [id number] DOB: 19-Jul-1940 DOA: 06/16/2016  PCP: [facility] Patient coming from: home  Chief Complaint: chest pain  HPI: Vanessa Trujillo is a very pleasant 75 y.o. female with medical history significant for urinary tract infection, GERD, depression/anxiety, presents to the emergency Department chief complaint sudden onset chest pain. Patient being note for chest pain rule out  Information is obtained from the patient. She states her last couple days she's had a dry cough particularly at night sore throat and "laryngitis". She saw her PCP yesterday and was provided with "cough medicine with codeine". She states she took a dose before bed. She awakened around 4 AM with sharp persistent epigastric chest pain. She states the pain radiated to her right shoulder in the right-sided of her neck and her back. He rates the pain a 7 out of 10 at worst. Associated symptoms include diaphoresis nausea without vomiting. She denies any shortness of breath palpitations lower extremity edema. She denies headache dizziness syncope or near-syncope. She states she waited a few minutes for pain to resolve itself when it did not she called EMS. She denies dysuria hematuria frequency or urgency. She denies abdominal pain diarrhea constipation melena. She denies fever chills recent travel or sick contacts. In the emergency department she is provided with a GI cocktail and at the time of admission is pain-free  ED Course: Patient is afebrile hemodynamically stable and not hypoxic. She is provided with aspirin and nitroglycerin per EMS. Once she got to the emergency department she is provided with a GI cocktail  Review of Systems: As per HPI otherwise 10 point review of systems negative.   Ambulatory Status: She ambulates independently no recent falls  Past Medical History:  Diagnosis Date  . Breast cyst    Aspirated  . Chest pain   . Chronic UTI   .  Fibroid   . GERD (gastroesophageal reflux disease)   . Hematuria   . Hepatitis    age 72  . Insomnia   . Kidney stones 1999  . Nonpuerperal mastitis 11/91  . OAB (overactive bladder)   . Spinal stenosis   . TMJ (sprain of temporomandibular joint)     Past Surgical History:  Procedure Laterality Date  . APPENDECTOMY  1959  . BREAST CYST ASPIRATION    . CATARACT EXTRACTION Right 09/2015   [name]  . CHOLECYSTECTOMY  1988  . Hysteroscopic resection  04/1999   small fibroid polyps  . LITHOTRIPSY     stent  . TONSILLECTOMY AND ADENOIDECTOMY  1946  . TUBAL LIGATION  1979   BTL    Social History   Social History  . Marital status: Married    Spouse name: N/A  . Number of children: N/A  . Years of education: N/A   Occupational History  . Not on file.  She is retired Forensic psychologist Social History Main Topics  . Smoking status: Never Smoker  . Smokeless tobacco: Never Used  . Alcohol use No  . Drug use: No  . Sexual activity: No   Other Topics Concern  . Not on file   Social History Narrative  . No narrative on file  She lives alone retired she has one daughter and 2 grandchildren she is independent with ADLs no recent falls  No Known Allergies  Family History  Problem Relation Age of Onset  . Breast cancer Mother 50  . Heart failure Father   . Emphysema  Father   . Breast cancer Maternal Grandmother 50    Prior to Admission medications   Medication Sig Start Date End Date Taking? Authorizing Provider  Cholecalciferol (VITAMIN D PO) Take by mouth. 1000IU 2 x daily    Historical Provider, MD  citalopram (CELEXA) 20 MG tablet Take 1 tablet (20 mg total) by mouth daily. 09/21/15   Megan Salon, MD  Difluprednate (DUREZOL) 0.05 % EMUL Apply to eye daily.    Historical Provider, MD  mirabegron ER (MYRBETRIQ) 50 MG TB24 tablet Take 1 tablet by mouth. 12/31/13   Historical Provider, MD  Multiple Vitamins-Minerals (PRESERVISION AREDS 2) CAPS Take by mouth daily.     Historical Provider, MD  omeprazole (PRILOSEC) 20 MG capsule Take 20 mg by mouth daily.    Historical Provider, MD  PROLENSA 0.07 % SOLN INSTILL 1 DROP INTO RIGHT EYE EVERY DAY STARTING ON 09/12/15 AND CONTINUE UNTIL GONE 08/31/15   Historical Provider, MD  trimethoprim (TRIMPEX) 100 MG tablet Take 100 mg by mouth at bedtime. 08/26/15   Historical Provider, MD    Physical Exam: Vitals:   06/16/16 0630 06/16/16 0645 06/16/16 0700 06/16/16 0715  BP: 123/64 131/71 114/62 121/61  Pulse: 65 70 68 65  Resp: 13 16 14 17   Temp:      TempSrc:      SpO2: 92% 95% 95% 91%  Weight:      Height:         General:  Appears calm and comfortable, sitting up in bed no acute distress Eyes:  PERRL, EOMI, normal lids, iris ENT:  grossly normal hearing, lips & tongue, mucous membranes of her mouth are moist and pink, oropharynx without erythema or exudate Neck:  no LAD, masses or thyromegaly Cardiovascular:  RRR, no m/r/g. No LE edema. Pedal pulses present and palpable Respiratory:   Normal respiratory effort. Fine faint crackles right base otherwise clear hear no wheeze no rhonchi Abdomen:  soft, ntnd, positive bowel sounds no guarding or rebounding Skin:  no rash or induration seen on limited exam Musculoskeletal:  grossly normal tone BUE/BLE, good ROM, no bony abnormality, joints without swelling/erythema Psychiatric:  grossly normal mood and affect, speech fluent and appropriate, AOx3 Neurologic:  CN 2-12 grossly intact, moves all extremities in coordinated fashion, sensation intact speech clear facial symmetry  Labs on Admission: I have personally reviewed following labs and imaging studies  CBC:  Recent Labs Lab 06/16/16 0553  WBC 8.2  NEUTROABS 4.5  HGB 12.5  HCT 38.1  MCV 91.1  PLT 0000000   Basic Metabolic Panel:  Recent Labs Lab 06/16/16 0553  NA 138  K 3.9  CL 104  CO2 28  GLUCOSE 140*  BUN 19  CREATININE 0.61  CALCIUM 8.9   GFR: Estimated Creatinine Clearance: 54.7 mL/min  (by C-G formula based on SCr of 0.61 mg/dL). Liver Function Tests:  Recent Labs Lab 06/16/16 0553  AST 52*  ALT 28  ALKPHOS 92  BILITOT 0.6  PROT 6.1*  ALBUMIN 3.1*    Recent Labs Lab 06/16/16 0553  LIPASE 30   No results for input(s): AMMONIA in the last 168 hours. Coagulation Profile: No results for input(s): INR, PROTIME in the last 168 hours. Cardiac Enzymes: No results for input(s): CKTOTAL, CKMB, CKMBINDEX, TROPONINI in the last 168 hours. BNP (last 3 results) No results for input(s): PROBNP in the last 8760 hours. HbA1C: No results for input(s): HGBA1C in the last 72 hours. CBG: No results for input(s): GLUCAP in the last  168 hours. Lipid Profile: No results for input(s): CHOL, HDL, LDLCALC, TRIG, CHOLHDL, LDLDIRECT in the last 72 hours. Thyroid Function Tests: No results for input(s): TSH, T4TOTAL, FREET4, T3FREE, THYROIDAB in the last 72 hours. Anemia Panel: No results for input(s): VITAMINB12, FOLATE, FERRITIN, TIBC, IRON, RETICCTPCT in the last 72 hours. Urine analysis:    Component Value Date/Time   COLORURINE ORANGE (A) 12/26/2013 1349   APPEARANCEUR CLEAR 12/26/2013 1349   LABSPEC 1.022 12/26/2013 1349   PHURINE 6.5 12/26/2013 1349   GLUCOSEU NEG 12/26/2013 1349   HGBUR LARGE (A) 12/26/2013 1349   BILIRUBINUR n 04/14/2015 1127   KETONESUR NEG 12/26/2013 1349   PROTEINUR n 04/14/2015 1127   PROTEINUR 100 (A) 12/26/2013 1349   UROBILINOGEN negative 04/14/2015 1127   UROBILINOGEN 1 12/26/2013 1349   NITRITE n 04/14/2015 1127   NITRITE POS (A) 12/26/2013 1349   LEUKOCYTESUR moderate (2+) (A) 04/14/2015 1127    Creatinine Clearance: Estimated Creatinine Clearance: 54.7 mL/min (by C-G formula based on SCr of 0.61 mg/dL).  Sepsis Labs: @LABRCNTIP (procalcitonin:4,lacticidven:4) )No results found for this or any previous visit (from the past 240 hour(s)).   Radiological Exams on Admission: Dg Chest 2 View  Result Date: 06/16/2016 CLINICAL DATA:   Cough for 1 week. Awakened this morning with sudden onset central chest pain radiating to the back. EXAM: CHEST  2 VIEW COMPARISON:  None. FINDINGS: The lungs are clear. There is borderline cardiomegaly. No pleural effusions. Normal pulmonary vasculature. Hilar and mediastinal contours are unremarkable. IMPRESSION: No acute cardiopulmonary disease. Electronically Signed   By: Andreas Newport M.D.   On: 06/16/2016 05:55    EKG: Independently reviewed. Sinus rhythm Low voltage, precordial leads Borderline T abnormalities, anterior leads  Assessment/Plan Principal Problem:   Chest pain at rest Active Problems:   Cough   Hyperglycemia   #1. Chest pain at rest. Heart score 4. Suspect more GI related. Pain nonreproducible. Initial troponin negative. EKG with nonspecific ST changes. Chest xray no acute cardiopulmonary disease.  Pain-free on admission after GI cocktail aspirin and nitroglycerin -Admit to telemetry -Cycle troponins -Serial EKG -Increase home PPI -Continue GI cocktail as needed -When necessary morphine for chest pain -When necessary nitroglycerin for chest pain -Outpatient follow-up with primary care provider once she rules out.   #2. Cough/sore throat. Patient with frequent dry cough. Chest xray as noted above. She is afebrile not hypoxic. No leukocytosis. Cough worse at night when lying down so suspect GI related -increase PPI as noted above -anti-tussive as needed -OP follow up  #3. Hyperglycemia. No history of diabetes. No recent steroids. Serum glucose 140 -obtain A1c -OP follow up   DVT prophylaxis: lovenox Code Status: full  Family Communication: neighbor at bedside  Disposition Plan: home hopefully 24 hours  Consults called: none  Admission status: obs    Dyanne Carrel M MD [facility]  If 7PM-7AM, please contact night-coverage [email or web address] Password [email or web address]  06/16/2016, 7:52 AM

## 2016-06-16 NOTE — ED Provider Notes (Signed)
[facility] DEPT Provider Note   CSN: [id number] Arrival date & time: 06/16/16  [id number]     History   Chief Complaint Chief Complaint  Patient presents with  . Chest Pain    HPI Vanessa Trujillo is a 75 y.o. female.  Patient presents to the emergency department for evaluation of chest pain. Patient reports that she was awakened at 4 AM with 9 out of 10 left-sided chest pain. She had mild nausea and some diaphoresis associated with the symptoms. There was no shortness of breath. Patient called EMS. EMS administered for aspirin and 2 sublingual nitroglycerin without relief. At arrival to the ER she is still expressing pain but now reports that it seems to be easing off. She has no history of heart disease. Patient does report that she has been sick for the last several days, specifically with cough and chest congestion. She saw her doctor yesterday for this and was prescribed codeine cough syrup. She took the first dose around 10:30 PM.      Past Medical History:  Diagnosis Date  . Breast cyst    Aspirated  . Chronic UTI   . Fibroid   . Hematuria   . Hepatitis    age 15  . Insomnia   . Kidney stones 1999  . Nonpuerperal mastitis 11/91  . OAB (overactive bladder)   . Spinal stenosis   . TMJ (sprain of temporomandibular joint)     Patient Active Problem List   Diagnosis Date Noted  . Calculus of kidney 04/16/2013  . Urge incontinence 04/16/2013  . Urgency of micturation 04/16/2013  . Infection of urinary tract 04/16/2013    Past Surgical History:  Procedure Laterality Date  . APPENDECTOMY  1959  . BREAST CYST ASPIRATION    . CATARACT EXTRACTION Right 09/2015   [name]  . CHOLECYSTECTOMY  1988  . Hysteroscopic resection  04/1999   small fibroid polyps  . LITHOTRIPSY     stent  . TONSILLECTOMY AND ADENOIDECTOMY  1946  . TUBAL LIGATION  1979   BTL    OB History    Gravida Para Term Preterm AB Living   1 1       1    SAB TAB Ectopic Multiple Live Births                   Home Medications    Prior to Admission medications   Medication Sig Start Date End Date Taking? Authorizing Provider  Cholecalciferol (VITAMIN D PO) Take by mouth. 1000IU 2 x daily    Historical Provider, MD  citalopram (CELEXA) 20 MG tablet Take 1 tablet (20 mg total) by mouth daily. 09/21/15   Megan Salon, MD  Difluprednate (DUREZOL) 0.05 % EMUL Apply to eye daily.    Historical Provider, MD  mirabegron ER (MYRBETRIQ) 50 MG TB24 tablet Take 1 tablet by mouth. 12/31/13   Historical Provider, MD  Multiple Vitamins-Minerals (PRESERVISION AREDS 2) CAPS Take by mouth daily.    Historical Provider, MD  omeprazole (PRILOSEC) 20 MG capsule Take 20 mg by mouth daily.    Historical Provider, MD  PROLENSA 0.07 % SOLN INSTILL 1 DROP INTO RIGHT EYE EVERY DAY STARTING ON 09/12/15 AND CONTINUE UNTIL GONE 08/31/15   Historical Provider, MD  trimethoprim (TRIMPEX) 100 MG tablet Take 100 mg by mouth at bedtime. 08/26/15   Historical Provider, MD    Family History Family History  Problem Relation Age of Onset  . Breast cancer Mother 41  .  Heart failure Father   . Emphysema Father   . Breast cancer Maternal Grandmother 69    Social History Social History  Substance Use Topics  . Smoking status: Never Smoker  . Smokeless tobacco: Never Used  . Alcohol use No     Allergies   Patient has no known allergies.   Review of Systems Review of Systems  Constitutional: Positive for diaphoresis.  Respiratory: Positive for cough. Negative for shortness of breath.   Cardiovascular: Positive for chest pain.  Gastrointestinal: Positive for nausea.  All other systems reviewed and are negative.    Physical Exam Updated Vital Signs BP 110/63 (BP Location: Left Arm)   Pulse 68   Temp 98 F (36.7 C) (Oral)   Resp 13   Ht 5\' 5"  (1.651 m)   Wt 144 lb (65.3 kg)   LMP 07/11/1999   SpO2 91%   BMI 23.96 kg/m   Physical Exam  Constitutional: She is oriented to person, place, and time.  She appears well-developed and well-nourished. No distress.  HENT:  Head: Normocephalic and atraumatic.  Right Ear: Hearing normal.  Left Ear: Hearing normal.  Nose: Nose normal.  Mouth/Throat: Oropharynx is clear and moist and mucous membranes are normal.  Eyes: Conjunctivae and EOM are normal. Pupils are equal, round, and reactive to light.  Neck: Normal range of motion. Neck supple.  Cardiovascular: Regular rhythm, S1 normal and S2 normal.  Exam reveals no gallop and no friction rub.   No murmur heard. Pulmonary/Chest: Effort normal and breath sounds normal. No respiratory distress. She exhibits no tenderness.  Abdominal: Soft. Normal appearance and bowel sounds are normal. There is no hepatosplenomegaly. There is no tenderness. There is no rebound, no guarding, no tenderness at McBurney's point and negative Murphy's sign. No hernia.  Musculoskeletal: Normal range of motion.  Neurological: She is alert and oriented to person, place, and time. She has normal strength. No cranial nerve deficit or sensory deficit. Coordination normal. GCS eye subscore is 4. GCS verbal subscore is 5. GCS motor subscore is 6.  Skin: Skin is warm, dry and intact. No rash noted. No cyanosis.  Psychiatric: She has a normal mood and affect. Her speech is normal and behavior is normal. Thought content normal.  Nursing note and vitals reviewed.    ED Treatments / Results  Labs (all labs ordered are listed, but only abnormal results are displayed) Labs Reviewed  CBC WITH DIFFERENTIAL/PLATELET  COMPREHENSIVE METABOLIC PANEL  LIPASE, BLOOD    EKG  EKG Interpretation  Date/Time:  Friday June 16 2016 [id number] EST Ventricular Rate:  67 PR Interval:    QRS Duration: 96 QT Interval:  419 QTC Calculation: 443 R Axis:   51 Text Interpretation:  Sinus rhythm Low voltage, precordial leads Borderline T abnormalities, anterior leads Confirmed by Amberrose Friebel  MD, Claudett Bayly [id number]) on 06/16/2016 5:29:55 AM        Radiology No results found.  Procedures Procedures (including critical care time)  Medications Ordered in ED Medications - No data to display   Initial Impression / Assessment and Plan / ED Course  I have reviewed the triage vital signs and the nursing notes.  Pertinent labs & imaging results that were available during my care of the patient were reviewed by me and considered in my medical decision making (see chart for details).  Clinical Course    Patient presents to the emergency room for evaluation of chest pain. She had onset of chest pain while sleeping this morning. Pain awakened  her. She does not have any known coronary artery disease. Heart score equals 4. She was given aspirin and nitroglycerin by EMS, initially did not endorse improvement, but by time she arrived in the ER pain was easing off. Unclear if this was a nitroglycerin effect.  Patient with nonspecific EKG changes, negative troponin at arrival. Will require observation for further cardiac evaluation.  HEART Score for Major Cardiac Events from [email or web address]  on 06/16/2016 ** All calculations should be rechecked by clinician prior to use **  RESULT SUMMARY: 4 points Moderate Score (4-6 points)  Risk of MACE of 12-16.6%.   INPUTS: History -> 0 = Slightly suspicious EKG -> 1 = Non-specific repolarization disturbance Age -> 2 = =65 Risk factors -> 1 = 1-2 risk factors Initial troponin -> 0 = =normal limit   Final Clinical Impressions(s) / ED Diagnoses   Final diagnoses:  Chest pain, unspecified type    New Prescriptions New Prescriptions   No medications on file     Orpah Greek, MD 06/16/16 [phone]

## 2016-06-16 NOTE — ED Notes (Signed)
Attempted to call report

## 2016-06-16 NOTE — Progress Notes (Signed)
CRITICAL VALUE ALERT  Critical value received:  Troponin 0.05  Date of notification:  06/16/16   Time of notification:  1940  Critical value read back:yes   Nurse who received alert:  Renita Papa   MD notified (1st page):  Walden Field NP  Time of first page:  1945  Responding MD:  Walden Field NP  Time MD responded:  1955  Order to draw another troponin. Will continue to monitor.

## 2016-06-16 NOTE — ED Notes (Signed)
Heart Healthy Diet was ordered for Lunch. 

## 2016-06-16 NOTE — Care Management Obs Status (Signed)
[facility] NOTIFICATION   Patient Details  Name: EUNITA KERSTIENS MRN: [id number] Date of Birth: 13-Aug-1940   Medicare Observation Status Notification Given:  Yes    Maryclare Labrador, RN 06/16/2016, 3:23 PM

## 2016-06-16 NOTE — ED Notes (Signed)
Nurse will draw labs. 

## 2016-06-17 ENCOUNTER — Encounter (HOSPITAL_COMMUNITY): Payer: Self-pay | Admitting: Physician Assistant

## 2016-06-17 DIAGNOSIS — K219 Gastro-esophageal reflux disease without esophagitis: Secondary | ICD-10-CM | POA: Diagnosis not present

## 2016-06-17 DIAGNOSIS — Z79899 Other long term (current) drug therapy: Secondary | ICD-10-CM | POA: Diagnosis not present

## 2016-06-17 DIAGNOSIS — R079 Chest pain, unspecified: Secondary | ICD-10-CM | POA: Diagnosis not present

## 2016-06-17 DIAGNOSIS — F32A Depression, unspecified: Secondary | ICD-10-CM | POA: Diagnosis present

## 2016-06-17 DIAGNOSIS — F329 Major depressive disorder, single episode, unspecified: Secondary | ICD-10-CM | POA: Diagnosis present

## 2016-06-17 LAB — HEMOGLOBIN A1C
Hgb A1c MFr Bld: 5.9 % — ABNORMAL HIGH (ref 4.8–5.6)
Mean Plasma Glucose: 123 mg/dL

## 2016-06-17 LAB — TROPONIN I: Troponin I: <0.03 ng/mL (ref <0.03)

## 2016-06-17 NOTE — Consult Note (Signed)
CARDIOLOGY CONSULT NOTE   Patient ID: Vanessa Trujillo MRN: [id number] DOB/AGE: 10-14-1940 75 y.o.  Admit date: 06/16/2016  Primary Physician   Haywood Pao, MD Primary Cardiologist   New Reason for Consultation   Chest pain Requesting MD: Dr Maylene Roes  [id number] [name] is a 75 y.o. year old female with a history of UTI, GERD, chest pain. No hx HTN, HLD, DM or FH CAD.   Pt admitted 12/08 w/ CP, cards asked to see. She had an echo and stress test remotely, not in our current computer system.    Pt has a long history of chest pain, felt secondary to indigestion. She has been sick recently with cough and cold. She was prescribed cough rx w/ codeine to take because her cough was so bad. She took her first dose 12/07 pm and went to sleep.   She woke with chest pain, pressure, through to her back and up into the right side of her neck. EMS was called. They gave her ASA and SL NTG x 2. The pain was 9/10 at its worst. The nitro would seem to make it decrease to a 4-5/10 for a short time, but then it would go back up.   In the ER, she got a GI cocktail in the ER and again later, which helped. She was given Tussionex for her cough, which helped her cough. She feels well now.  She has never had exertional chest pain. She exercises regularly, no limitations. She denies DOE, LE edema, orthopnea, PND or palpitations.  Pt very reluctant to do any cardiac testing, agrees to discuss with MD. Does not want testing in the hospital.   Past Medical History:  Diagnosis Date  . Breast cyst    Aspirated  . Chest pain   . Chronic UTI   . Fibroid   . GERD (gastroesophageal reflux disease)   . Hematuria   . Hepatitis    age 33  . Insomnia   . Kidney stones 1999  . Nonpuerperal mastitis 11/91  . OAB (overactive bladder)   . Spinal stenosis   . TMJ (sprain of temporomandibular joint)      Past Surgical History:  Procedure Laterality Date  . APPENDECTOMY  1959  . BREAST CYST  ASPIRATION    . CATARACT EXTRACTION Right 09/2015   [name]  . CHOLECYSTECTOMY  1988  . Hysteroscopic resection  04/1999   small fibroid polyps  . LITHOTRIPSY     stent  . TONSILLECTOMY AND ADENOIDECTOMY  1946  . TUBAL LIGATION  1979   BTL    No Known Allergies  I have reviewed the patient's current medications . aspirin EC  325 mg Oral Daily  . cholecalciferol  1,000 Units Oral Daily  . citalopram  20 mg Oral Daily  . enoxaparin (LOVENOX) injection  40 mg Subcutaneous Q24H  . mirabegron ER  50 mg Oral Daily  . pantoprazole  40 mg Oral BID    acetaminophen, chlorpheniramine-HYDROcodone, gi cocktail, menthol-cetylpyridinium, morphine injection, ondansetron (ZOFRAN) IV  Prior to Admission medications   Medication Sig Start Date End Date Taking? Authorizing Provider  Cholecalciferol (VITAMIN D PO) Take 1 capsule by mouth 2 (two) times daily. 1000IU 2 x daily    Yes Historical Provider, MD  citalopram (CELEXA) 20 MG tablet Take 1 tablet (20 mg total) by mouth daily. 09/21/15  Yes Megan Salon, MD  Multiple Vitamins-Minerals (PRESERVISION AREDS 2) CAPS Take 1 capsule by mouth 2 (two)  times daily.    Yes Historical Provider, MD  omeprazole (PRILOSEC) 20 MG capsule Take 20 mg by mouth daily.   Yes Historical Provider, MD  Pitavastatin Calcium (LIVALO) 2 MG TABS Take 2 mg by mouth daily.   Yes Historical Provider, MD  trimethoprim (TRIMPEX) 100 MG tablet Take 100 mg by mouth at bedtime. 08/26/15  Yes Historical Provider, MD     Social History   Social History  . Marital status: Widowed    Spouse name: N/A  . Number of children: N/A  . Years of education: N/A   Occupational History  . Retired.    Social History Main Topics  . Smoking status: Never Smoker  . Smokeless tobacco: Never Used  . Alcohol use No  . Drug use: No  . Sexual activity: No   Other Topics Concern  . Not on file   Social History Narrative   Pt widowed since 2012, lives alone.    Family Status    Relation Status  . Mother Deceased  . Father Deceased  . Maternal Grandmother    Family History  Problem Relation Age of Onset  . Breast cancer Mother 79  . Heart failure Father   . Emphysema Father     Died age 73  . Breast cancer Maternal Grandmother 50     ROS:  Full 14 point review of systems complete and found to be negative unless listed above.  Physical Exam: Blood pressure 113/61, pulse 63, temperature 97.5 F (36.4 C), resp. rate (!) 21, height 5' 5.5" (1.664 m), weight 142 lb 4.8 oz (64.5 kg), last menstrual period 07/11/1999, SpO2 94 %.  General: Well developed, well nourished, female in no acute distress Head: Eyes PERRLA, No xanthomas.   Normocephalic and atraumatic, oropharynx without edema or exudate. Dentition: fair Lungs: Few rales, good air exchange.  Heart: HRRR S1 S2, no rub/gallop, no murmur. pulses are 2+ all 4 extrem.   Neck: No carotid bruits. No lymphadenopathy.  JVD not elevated Abdomen: Bowel sounds present, abdomen soft and non-tender without masses or hernias noted. Msk:  No spine or cva tenderness. No weakness, no joint deformities or effusions. Extremities: No clubbing or cyanosis. No edema.  Neuro: Alert and oriented X 3. No focal deficits noted. Psych:  Good affect, responds appropriately Skin: No rashes or lesions noted.  Labs:   Lab Results  Component Value Date   WBC 8.2 06/16/2016   HGB 12.5 06/16/2016   HCT 38.1 06/16/2016   MCV 91.1 06/16/2016   PLT 254 06/16/2016   No results for input(s): INR in the last 72 hours.   Recent Labs Lab 06/16/16 0553  NA 138  K 3.9  CL 104  CO2 28  BUN 19  CREATININE 0.61  CALCIUM 8.9  PROT 6.1*  BILITOT 0.6  ALKPHOS 92  ALT 28  AST 52*  GLUCOSE 140*  ALBUMIN 3.1*    Recent Labs  06/16/16 1226 06/16/16 1822 06/17/16 0036  TROPONINI <0.03 0.05* <0.03    Recent Labs  06/16/16 0601  TROPIPOC 0.00   Lipase  Date/Time Value Ref Range Status  06/16/2016 05:53 AM 30 11 - 51  U/L Final   Echo: n/a  ECG:  12/09 SR, no acute changes  Cath: n/a  Radiology:  Dg Chest 2 View Result Date: 06/16/2016 CLINICAL DATA:  Cough for 1 week. Awakened this morning with sudden onset central chest pain radiating to the back. EXAM: CHEST  2 VIEW COMPARISON:  None. FINDINGS: The lungs  are clear. There is borderline cardiomegaly. No pleural effusions. Normal pulmonary vasculature. Hilar and mediastinal contours are unremarkable. IMPRESSION: No acute cardiopulmonary disease. Electronically Signed   By: Andreas Newport M.D.   On: 06/16/2016 05:55    ASSESSMENT AND PLAN:   The patient was seen today by Dr Radford Pax, the patient evaluated and the data reviewed.   Principal Problem: 1.  Chest pain at rest - minimal elevation in middle troponin, unclear reasons. - no hx exertional CP and ECG is ok. - sx mild improvement with NTG, relief w/ GI cocktail - ck echo, if EF ok, can d/c w/ outpatient MV.  Active Problems: 2.  Cough - improved w/ rx,  Per IM  3.  Hyperglycemia - no hx DM, A1c borderline, per IM  SignedRosaria Ferries, PA-C 06/17/2016 10:27 AM Beeper [id number]  Co-Sign MD

## 2016-06-17 NOTE — Progress Notes (Signed)
PROGRESS NOTE    Vanessa Trujillo  [id number] DOB: 1940/07/23 DOA: 06/16/2016 PCP: Haywood Pao, MD     Brief Narrative:  Vanessa Trujillo is a very pleasant 75 y.o. female with medical history significant for urinary tract infection, GERD, depression/anxiety, presents to the emergency department chief complaint sudden onset chest pain. She states her last couple days she's had a dry cough particularly at night sore throat and "laryngitis". She saw her PCP yesterday and was provided with "cough medicine with codeine". She states she took a dose before bed. She awakened around 4 AM with sharp persistent epigastric chest pain. She states the pain radiated to her right shoulder in the right-sided of her neck and her back. He rates the pain a 7 out of 10 at worst. Associated symptoms include diaphoresis nausea without vomiting. In the emergency department, she is provided with a GI cocktail and at the time of admission is pain-free   Assessment & Plan:   Principal Problem:   Chest pain at rest Active Problems:   Cough   Hyperglycemia   Gastroesophageal reflux disease without esophagitis   Atypical chest pain -EKG with normal sinus and nonspecific ST flattening in anterior leads  -Trop peaked at 0.05 and now normal  -Cardiology following -Echo pending -NPO at midnight for stress test  -Aspirin daily   Depression -Continue celexa   DVT prophylaxis: lovenox Code Status: Full Family Communication: no family at bedside Disposition Plan: home once work up complete    Consultants:   Cardiology  Procedures:   None  Antimicrobials:   None     Subjective: Feeling good this morning. She states that she has had indigestion and heartburn over the past few months. She had chest pressure that radiated to her back yesterday which prompted her to be evaluated in the emergency department. She states that her chest pain completely resolved with GI cocktail in the ED. Currently  chest pain free. Denies any shortness of breath. No nausea or vomiting.   Objective: Vitals:   06/16/16 1330 06/16/16 1354 06/16/16 1900 06/17/16 0500  BP: 135/60 (!) 132/54 (!) 124/56 113/61  Pulse: 64 71 77 63  Resp: 21 16 16  (!) 21  Temp:  98 F (36.7 C) 98.3 F (36.8 C) 97.5 F (36.4 C)  TempSrc:  Oral Oral   SpO2: 94% 97% 93% 94%  Weight:  64.7 kg (142 lb 9.6 oz)  64.5 kg (142 lb 4.8 oz)  Height:  5' 5.5" (1.664 m)      Intake/Output Summary (Last 24 hours) at 06/17/16 1316 Last data filed at 06/17/16 0500  Gross per 24 hour  Intake                0 ml  Output              300 ml  Net             -300 ml   Filed Weights   06/16/16 0527 06/16/16 1354 06/17/16 0500  Weight: 65.3 kg (144 lb) 64.7 kg (142 lb 9.6 oz) 64.5 kg (142 lb 4.8 oz)    Examination:  General exam: Appears calm and comfortable  Respiratory system: Clear to auscultation. Respiratory effort normal. Cardiovascular system: S1 & S2 heard, RRR. No JVD, murmurs, rubs, gallops or clicks. No pedal edema. Gastrointestinal system: Abdomen is nondistended, soft and nontender. No organomegaly or masses felt. Normal bowel sounds heard. Central nervous system: Alert and oriented. No focal neurological deficits. Extremities:  Symmetric 5 x 5 power. Skin: No rashes, lesions or ulcers Psychiatry: Judgement and insight appear normal. Mood & affect appropriate.   Data Reviewed: I have personally reviewed following labs and imaging studies  CBC:  Recent Labs Lab 06/16/16 0553  WBC 8.2  NEUTROABS 4.5  HGB 12.5  HCT 38.1  MCV 91.1  PLT 0000000   Basic Metabolic Panel:  Recent Labs Lab 06/16/16 0553  NA 138  K 3.9  CL 104  CO2 28  GLUCOSE 140*  BUN 19  CREATININE 0.61  CALCIUM 8.9   GFR: Estimated Creatinine Clearance: 55.8 mL/min (by C-G formula based on SCr of 0.61 mg/dL). Liver Function Tests:  Recent Labs Lab 06/16/16 0553  AST 52*  ALT 28  ALKPHOS 92  BILITOT 0.6  PROT 6.1*  ALBUMIN 3.1*     Recent Labs Lab 06/16/16 0553  LIPASE 30   No results for input(s): AMMONIA in the last 168 hours. Coagulation Profile: No results for input(s): INR, PROTIME in the last 168 hours. Cardiac Enzymes:  Recent Labs Lab 06/16/16 1226 06/16/16 1822 06/17/16 0036  TROPONINI <0.03 0.05* <0.03   BNP (last 3 results) No results for input(s): PROBNP in the last 8760 hours. HbA1C:  Recent Labs  06/16/16 0553  HGBA1C 5.9*   CBG: No results for input(s): GLUCAP in the last 168 hours. Lipid Profile: No results for input(s): CHOL, HDL, LDLCALC, TRIG, CHOLHDL, LDLDIRECT in the last 72 hours. Thyroid Function Tests: No results for input(s): TSH, T4TOTAL, FREET4, T3FREE, THYROIDAB in the last 72 hours. Anemia Panel: No results for input(s): VITAMINB12, FOLATE, FERRITIN, TIBC, IRON, RETICCTPCT in the last 72 hours. Sepsis Labs: No results for input(s): PROCALCITON, LATICACIDVEN in the last 168 hours.  No results found for this or any previous visit (from the past 240 hour(s)).     Radiology Studies: Dg Chest 2 View  Result Date: 06/16/2016 CLINICAL DATA:  Cough for 1 week. Awakened this morning with sudden onset central chest pain radiating to the back. EXAM: CHEST  2 VIEW COMPARISON:  None. FINDINGS: The lungs are clear. There is borderline cardiomegaly. No pleural effusions. Normal pulmonary vasculature. Hilar and mediastinal contours are unremarkable. IMPRESSION: No acute cardiopulmonary disease. Electronically Signed   By: Andreas Newport M.D.   On: 06/16/2016 05:55      Scheduled Meds: . aspirin EC  325 mg Oral Daily  . cholecalciferol  1,000 Units Oral Daily  . citalopram  20 mg Oral Daily  . enoxaparin (LOVENOX) injection  40 mg Subcutaneous Q24H  . mirabegron ER  50 mg Oral Daily  . pantoprazole  40 mg Oral BID   Continuous Infusions:   LOS: 0 days    Time spent: 40 minutes   Dessa Phi, DO [facility] [email or web address] Password [email or web address] 06/17/2016,  1:16 PM

## 2016-06-17 NOTE — Progress Notes (Signed)
RN notified CM to please explain MOON delivered previously by Waunita Schooner, CM.  Pt did not understand impact of MOON and was worried Rehab (if needed) would not be covered as she is OBS. This CM explained pt has commercial Medicare and SNF WOULD  Be covered if pt met medical criteria to NEED SNF; but at this poiont in time pt is ambulatory and does not require rehab.  No other CM needs were communicated.

## 2016-06-18 ENCOUNTER — Observation Stay (HOSPITAL_BASED_OUTPATIENT_CLINIC_OR_DEPARTMENT_OTHER): Payer: Medicare Other

## 2016-06-18 DIAGNOSIS — R079 Chest pain, unspecified: Secondary | ICD-10-CM | POA: Diagnosis not present

## 2016-06-18 LAB — ECHOCARDIOGRAM COMPLETE
CHL CUP MV DEC (S): 250
E/e' ratio: 9.7
EWDT: 250 ms
FS: 33 % (ref 28–44)
Height: 65.5 in
IV/PV OW: 0.87
LADIAMINDEX: 2.29 cm/m2
LASIZE: 39 mm
LAVOL: 32.8 mL
LAVOLA4C: 27.5 mL
LAVOLIN: 19.3 mL/m2
LEFT ATRIUM END SYS DIAM: 39 mm
LV E/e'average: 9.7
LV PW d: 11.6 mm — AB (ref 0.6–1.1)
LV TDI E'MEDIAL: 6.47
LVEEMED: 9.7
LVELAT: 8.33 cm/s
LVOT area: 2.27 cm2
LVOT diameter: 17 mm
Lateral S' vel: 16.8 cm/s
MV pk A vel: 107 m/s
MV pk E vel: 80.8 m/s
MVPG: 3 mmHg
RV TAPSE: 15.5 mm
TDI e' lateral: 8.33
Weight: 2204.8 oz

## 2016-06-18 LAB — NM MYOCAR MULTI W/SPECT W/WALL MOTION / EF
CHL CUP RESTING HR STRESS: 83 {beats}/min
CSEPED: 7 min
CSEPEW: 7.3 METS
CSEPPHR: 130 {beats}/min
Exercise duration (sec): 1 s
MPHR: 145 {beats}/min
Percent HR: 89 %
RPE: 18

## 2016-06-18 MED ORDER — TECHNETIUM TC 99M TETROFOSMIN IV KIT
30.0000 | PACK | Freq: Once | INTRAVENOUS | Status: AC | PRN
  Administered 2016-06-18: 30 via INTRAVENOUS

## 2016-06-18 MED ORDER — ASPIRIN 81 MG PO TBEC: 81.0000 mg | DELAYED_RELEASE_TABLET | Freq: Every day | ORAL | 0 refills | Status: DC

## 2016-06-18 MED ORDER — TECHNETIUM TC 99M TETROFOSMIN IV KIT
10.0000 | PACK | Freq: Once | INTRAVENOUS | Status: AC | PRN
  Administered 2016-06-18: 10 via INTRAVENOUS

## 2016-06-18 NOTE — Progress Notes (Signed)
Patient ID: Vanessa Trujillo, female   DOB: May 15, 1941, 75 y.o.   MRN: [id number]   Patient for stress test today, further recs pending results    Zandra Abts MD

## 2016-06-18 NOTE — Progress Notes (Signed)
  Echocardiogram 2D Echocardiogram has been performed.  Vanessa Trujillo 06/18/2016, 11:28 AM

## 2016-06-18 NOTE — Discharge Summary (Signed)
Physician Discharge Summary  [name] [id number] DOB: 01-31-1941 DOA: 06/16/2016  PCP: Haywood Pao, MD  Admit date: 06/16/2016 Discharge date: 06/18/2016  Admitted From: Home Disposition:  Home  Recommendations for Outpatient Follow-up:  1. Follow up with PCP in 1-2 weeks.  2. You can follow up with cardiology on an as needed basis.    Home Health: No  Equipment/Devices: None   Discharge Condition: Stable CODE STATUS: Full  Diet recommendation: Heart healthy  Brief/Interim Summary: Eh R Vanessa Trujillo a very pleasant 75 y.o.femalewith medical history significant for urinary tract infection, GERD, depression/anxiety, presents to the emergency department chief complaint sudden onset chest pain. She states her last couple days she's had a dry cough particularly at night sore throat and "laryngitis". She saw her PCP yesterday and was provided with "cough medicine with codeine". She states she took a dose before bed. She awakened around 4 AM with sharp persistent epigastric chest pain. She states the pain radiated to her right shoulder in the right-sided of her neck and her back. He rates the pain a 7 out of 10 at worst. Associated symptoms include diaphoresis nausea without vomiting. In the emergency department, she is provided with a GI cocktail and at the time of admission is pain-free. She underwent echocardiogram as well as stress test which showed small zone of non reversible decreased myocardial perfusion involving the inferoseptal wall left ventricle, normal left ventricular wall motion, left ventricular ejection fraction 82%.  Subjective on day of discharge: Feeling well. No more episodes of chest pain since admission. Denies any shortness of breath, nausea, vomiting.   Discharge Diagnoses:  Principal Problem:   Chest pain at rest Active Problems:   Cough   Hyperglycemia   Gastroesophageal reflux disease without esophagitis   Depression  Atypical chest  pain -EKG with normal sinus and nonspecific ST flattening in anterior leads  -Trop peaked at 0.05 and now normal  -Cardiology following -Echo and stress test as below  -Aspirin daily   Depression -Continue celexa   Discharge Instructions  Discharge Instructions    Diet - low sodium heart healthy    Complete by:  As directed    Increase activity slowly    Complete by:  As directed        Medication List    TAKE these medications   aspirin 81 MG EC tablet Take 1 tablet (81 mg total) by mouth daily.   citalopram 20 MG tablet Commonly known as:  CELEXA Take 1 tablet (20 mg total) by mouth daily.   LIVALO 2 MG Tabs Generic drug:  Pitavastatin Calcium Take 2 mg by mouth daily.   omeprazole 20 MG capsule Commonly known as:  PRILOSEC Take 20 mg by mouth daily.   PRESERVISION AREDS 2 Caps Take 1 capsule by mouth 2 (two) times daily.   trimethoprim 100 MG tablet Commonly known as:  TRIMPEX Take 100 mg by mouth at bedtime.   VITAMIN D PO Take 1 capsule by mouth 2 (two) times daily. 1000IU 2 x daily      Follow-up Information    Haywood Pao, MD. Schedule an appointment as soon as possible for a visit in 1 week(s).   Specialty:  Internal Medicine Contact information: [address] [place] [address] [phone]          No Known Allergies  Consultations:  Cardiology    Procedures/Studies: Dg Chest 2 View  Result Date: 06/16/2016 CLINICAL DATA:  Cough for 1 week. Awakened this morning  with sudden onset central chest pain radiating to the back. EXAM: CHEST  2 VIEW COMPARISON:  None. FINDINGS: The lungs are clear. There is borderline cardiomegaly. No pleural effusions. Normal pulmonary vasculature. Hilar and mediastinal contours are unremarkable. IMPRESSION: No acute cardiopulmonary disease. Electronically Signed   By: Andreas Newport M.D.   On: 06/16/2016 05:55   Nm Myocar Multi W/spect W/wall Motion / Ef  Result Date: 06/18/2016 CLINICAL  DATA:  Chest pain, elevated cholesterol, family history of heart disease EXAM: MYOCARDIAL IMAGING WITH SPECT (REST AND EXERCISE) GATED LEFT VENTRICULAR WALL MOTION STUDY LEFT VENTRICULAR EJECTION FRACTION TECHNIQUE: Standard myocardial SPECT imaging was performed after resting intravenous injection of 10 mCi Tc-56m tetrofosmin. Subsequently, exercise tolerance test was performed by the patient under the supervision of the Cardiology staff. At peak-stress, 30 mCi Tc-52m tetrofosmin was injected intravenously and standard myocardial SPECT imaging was performed. Quantitative gated imaging was also performed to evaluate left ventricular wall motion, and estimate left ventricular ejection fraction. COMPARISON:  None FINDINGS: Perfusion: Small area of decreased perfusion at the inferoseptal wall LEFT ventricle on stress images, remaining unchanged on resting images. Remaining perfusion normal. Wall Motion: Normal left ventricular wall motion. No left ventricular dilation. Left Ventricular Ejection Fraction: 82 % End diastolic volume 46 ml End systolic volume 8 ml IMPRESSION: 1. Small zone of non reversible decreased myocardial perfusion involving the inferoseptal wall LEFT ventricle. 2. Normal left ventricular wall motion. 3. Left ventricular ejection fraction 82% 4. Non invasive risk stratification*: Low *2012 Appropriate Use Criteria for Coronary Revascularization Focused Update: J Am Coll Cardiol. [id number]. http://content.[email or web address].aspx?articleid=[email or web address] Electronically Signed   By: Lavonia Dana M.D.   On: 06/18/2016 13:28    Lexiscan stress test IMPRESSION: 1. Small zone of non reversible decreased myocardial perfusion involving the inferoseptal wall LEFT ventricle. 2. Normal left ventricular wall motion. 3. Left ventricular ejection fraction 82% 4. Non invasive risk stratification*: Low  Echo  Study Conclusions - Left ventricle: The cavity size was normal. Systolic function was  normal. The estimated ejection fraction was in the range of 60  to 65%. Wall motion was normal; there were no regional wall motion abnormalities. There was an increased relative contribution of atrial contraction to ventricular filling. Doppler parameters are consistent with abnormal left ventricular relaxation (grade 1 diastolic dysfunction). - Aortic valve: Trileaflet; normal thickness, mildly calcified leaflets. - Pulmonary arteries: Systolic pressure could not be accurately estimated.   Discharge Exam: Vitals:   06/18/16 0930 06/18/16 0932  BP: (!) 161/59 (!) 150/66  Pulse:    Resp:    Temp:     Vitals:   06/18/16 0925 06/18/16 0927 06/18/16 0930 06/18/16 0932  BP: (!) 185/94 (!) 152/90 (!) 161/59 (!) 150/66  Pulse:      Resp:      Temp:      TempSrc:      SpO2:      Weight:      Height:       General: Pt is alert, awake, not in acute distress Cardiovascular: RRR, S1/S2 +, no rubs, no gallops Respiratory: CTA bilaterally, no wheezing, no rhonchi Abdominal: Soft, NT, ND, bowel sounds + Extremities: no edema, no cyanosis    The results of significant diagnostics from this hospitalization (including imaging, microbiology, ancillary and laboratory) are listed below for reference.     Microbiology: No results found for this or any previous visit (from the past 240 hour(s)).   Labs: BNP (last 3 results) No results for input(s): BNP in the last  8760 hours. Basic Metabolic Panel:  Recent Labs Lab 06/16/16 0553  NA 138  K 3.9  CL 104  CO2 28  GLUCOSE 140*  BUN 19  CREATININE 0.61  CALCIUM 8.9   Liver Function Tests:  Recent Labs Lab 06/16/16 0553  AST 52*  ALT 28  ALKPHOS 92  BILITOT 0.6  PROT 6.1*  ALBUMIN 3.1*    Recent Labs Lab 06/16/16 0553  LIPASE 30   No results for input(s): AMMONIA in the last 168 hours. CBC:  Recent Labs Lab 06/16/16 0553  WBC 8.2  NEUTROABS 4.5  HGB 12.5  HCT 38.1  MCV 91.1  PLT 254   Cardiac  Enzymes:  Recent Labs Lab 06/16/16 1226 06/16/16 1822 06/17/16 0036  TROPONINI <0.03 0.05* <0.03   BNP: Invalid input(s): POCBNP CBG: No results for input(s): GLUCAP in the last 168 hours. D-Dimer No results for input(s): DDIMER in the last 72 hours. Hgb A1c  Recent Labs  06/16/16 0553  HGBA1C 5.9*   Lipid Profile No results for input(s): CHOL, HDL, LDLCALC, TRIG, CHOLHDL, LDLDIRECT in the last 72 hours. Thyroid function studies No results for input(s): TSH, T4TOTAL, T3FREE, THYROIDAB in the last 72 hours.  Invalid input(s): FREET3 Anemia work up No results for input(s): VITAMINB12, FOLATE, FERRITIN, TIBC, IRON, RETICCTPCT in the last 72 hours. Urinalysis    Component Value Date/Time   COLORURINE YELLOW 06/16/2016 0857   APPEARANCEUR CLEAR 06/16/2016 0857   LABSPEC 1.025 06/16/2016 0857   PHURINE 5.0 06/16/2016 0857   GLUCOSEU NEGATIVE 06/16/2016 0857   HGBUR NEGATIVE 06/16/2016 0857   BILIRUBINUR NEGATIVE 06/16/2016 0857   BILIRUBINUR n 04/14/2015 1127   [place] 06/16/2016 0857   PROTEINUR NEGATIVE 06/16/2016 0857   UROBILINOGEN negative 04/14/2015 1127   UROBILINOGEN 1 12/26/2013 1349   NITRITE NEGATIVE 06/16/2016 0857   LEUKOCYTESUR NEGATIVE 06/16/2016 0857   Sepsis Labs Invalid input(s): PROCALCITONIN,  WBC,  LACTICIDVEN Microbiology No results found for this or any previous visit (from the past 240 hour(s)).   Time coordinating discharge: Over 30 minutes  SIGNED:  Dessa Phi, DO [facility] Pager [phone]  If 7PM-7AM, please contact night-coverage [email or web address] Password [email or web address] 06/18/2016, 1:53 PM

## 2016-06-18 NOTE — Progress Notes (Signed)
Exercise Myoview performed.  ECG w/ no clear ischemic changes. Target HR reached. No chest pain, +SOB 1 day study, images pending.  Rosaria Ferries, Hershal Coria 06/18/2016 9:31 AM Beeper [phone]

## 2016-06-18 NOTE — Progress Notes (Signed)
Echo with normal LVEF, no WMAs. Overall negative nuclear stress test. No further workup from cardiac standpoint, ok for discharge from our standpoint.   Zandra Abts MD

## 2016-06-18 NOTE — Progress Notes (Signed)
Patient Name: Vanessa Trujillo Date of Encounter: 06/18/2016  Primary Cardiologist: Dr Sundra Aland Problem List     Principal Problem:   Chest pain at rest Active Problems:   Cough   Hyperglycemia   Gastroesophageal reflux disease without esophagitis   Depression     Subjective   No more chest pain, no SOB  Inpatient Medications    Scheduled Meds: . aspirin EC  325 mg Oral Daily  . cholecalciferol  1,000 Units Oral Daily  . citalopram  20 mg Oral Daily  . enoxaparin (LOVENOX) injection  40 mg Subcutaneous Q24H  . mirabegron ER  50 mg Oral Daily  . pantoprazole  40 mg Oral BID   Continuous Infusions:  PRN Meds: acetaminophen, chlorpheniramine-HYDROcodone, gi cocktail, menthol-cetylpyridinium, morphine injection, ondansetron (ZOFRAN) IV   Vital Signs    Vitals:   06/17/16 1350 06/17/16 2100 06/18/16 0500 06/18/16 0900  BP: 122/69 131/71 (!) 107/50 139/78  Pulse: 62 78 69   Resp: 19 18 19    Temp: 98.2 F (36.8 C) 98.2 F (36.8 C) 98.1 F (36.7 C)   TempSrc: Oral     SpO2: 94% 96% 95%   Weight:   137 lb 12.8 oz (62.5 kg)   Height:        Intake/Output Summary (Last 24 hours) at 06/18/16 0919 Last data filed at 06/18/16 0500  Gross per 24 hour  Intake              280 ml  Output             1400 ml  Net            -1120 ml   Filed Weights   06/16/16 1354 06/17/16 0500 06/18/16 0500  Weight: 142 lb 9.6 oz (64.7 kg) 142 lb 4.8 oz (64.5 kg) 137 lb 12.8 oz (62.5 kg)    Physical Exam    GEN: Well nourished, well developed, in no acute distress.  HEENT: Grossly normal.  Neck: Supple, no JVD, carotid bruits, or masses. Cardiac: RRR, no murmurs, rubs, or gallops. No clubbing, cyanosis, edema.  Radials/DP/PT 2+ and equal bilaterally.  Respiratory:  Respirations regular and unlabored, clear to auscultation bilaterally. GI: Soft, nontender, nondistended, BS + x 4. MS: no deformity or atrophy. Skin: warm and dry, no rash. Neuro:  Strength and  sensation are intact. Psych: AAOx3.  Normal affect.  Labs    CBC  Recent Labs  06/16/16 0553  WBC 8.2  NEUTROABS 4.5  HGB 12.5  HCT 38.1  MCV 91.1  PLT 0000000   Basic Metabolic Panel  Recent Labs  06/16/16 0553  NA 138  K 3.9  CL 104  CO2 28  GLUCOSE 140*  BUN 19  CREATININE 0.61  CALCIUM 8.9   Liver Function Tests  Recent Labs  06/16/16 0553  AST 52*  ALT 28  ALKPHOS 92  BILITOT 0.6  PROT 6.1*  ALBUMIN 3.1*    Recent Labs  06/16/16 0553  LIPASE 30   Cardiac Enzymes  Recent Labs  06/16/16 1226 06/16/16 1822 06/17/16 0036  TROPONINI <0.03 0.05* <0.03   Hemoglobin A1C  Recent Labs  06/16/16 0553  HGBA1C 5.9*    Telemetry    SR, seen in nuc med - Personally Reviewed  ECG    SR, no acute changes - Personally Reviewed  Radiology    No results found.  Cardiac Studies   GXT MV - pending ECHO: ORDERED  Patient Profile  75 y.o. year old female with a history of UTI, GERD, chest pain. No hx HTN, HLD, DM or FH CAD. Admitted 12/08 for CP, also w/ viral URI. Echo and Myoview ordered  Assessment & Plan    Principal Problem: 1.  Chest pain at rest - minimal elevation in middle troponin, unclear reasons. - no hx exertional CP and ECG is ok. - sx mild improvement with NTG, relief w/ GI cocktail, no recurrence - ck echo (ordered), MV today to assess for ischemia  Active Problems: 2.  Cough - improved w/ rx,  Per IM - if sx are felt to be GI, cough may have exacerbated them  3.  Hyperglycemia - no hx DM, A1c borderline, per IM   Signed, Jaslyn Bansal, PA-C  06/18/2016, 9:19 AM

## 2016-09-22 ENCOUNTER — Other Ambulatory Visit: Payer: Self-pay | Admitting: Obstetrics & Gynecology

## 2016-09-25 NOTE — Telephone Encounter (Signed)
Medication refill request: celexa  Last AEX:  09/21/15 SM  Next AEX: 01/05/17 SM  Last MMG (if hormonal medication request): 11/29/15 BIRADS1:neg  Refill authorized: 09/21/15 #90tabs/4R. Today #90/0R?

## 2016-12-07 ENCOUNTER — Encounter: Payer: Self-pay | Admitting: Obstetrics & Gynecology

## 2016-12-21 ENCOUNTER — Other Ambulatory Visit: Payer: Self-pay | Admitting: Obstetrics & Gynecology

## 2016-12-21 NOTE — Telephone Encounter (Signed)
Medication refill request: celexa  Last AEX:  09/21/15 SM  Next AEX: 01/05/17 SM  Last MMG (if hormonal medication request): 11/29/16 BIRADS1:neg  Refill authorized: 09/25/16 #90tabs/ 0R. Today please advise.

## 2017-01-05 ENCOUNTER — Ambulatory Visit (INDEPENDENT_AMBULATORY_CARE_PROVIDER_SITE_OTHER): Payer: Medicare Other | Admitting: Obstetrics & Gynecology

## 2017-01-05 ENCOUNTER — Encounter: Payer: Self-pay | Admitting: Obstetrics & Gynecology

## 2017-01-05 VITALS — BP 116/70 | HR 80 | Resp 16 | Ht 64.25 in | Wt 143.0 lb

## 2017-01-05 DIAGNOSIS — Z0000 Encounter for general adult medical examination without abnormal findings: Secondary | ICD-10-CM | POA: Diagnosis not present

## 2017-01-05 DIAGNOSIS — Z01419 Encounter for gynecological examination (general) (routine) without abnormal findings: Secondary | ICD-10-CM

## 2017-01-05 LAB — POCT URINALYSIS DIPSTICK
BILIRUBIN UA: NEGATIVE
Glucose, UA: NEGATIVE
KETONES UA: NEGATIVE
LEUKOCYTES UA: NEGATIVE
Nitrite, UA: NEGATIVE
PH UA: 6 (ref 5.0–8.0)
PROTEIN UA: NEGATIVE
Urobilinogen, UA: 0.2 E.U./dL

## 2017-01-05 MED ORDER — CITALOPRAM HYDROBROMIDE 20 MG PO TABS
20.0000 mg | ORAL_TABLET | Freq: Every day | ORAL | 4 refills | Status: DC
Start: 2017-01-05 — End: 2018-01-29

## 2017-01-05 NOTE — Progress Notes (Signed)
76 y.o. G1P1 WidowedCaucasianF here for annual exam.  Having increased issues with incontinence and recurrent UTIs.  She's been seeing Dr. Gaynelle Trujillo.  She reports she did Botox and it worked for a short period of time.  Repeated the Botox and this didn't help at all.  She's been on different antibiotics.  Now seeing Dr. Matilde Trujillo as Dr. Gaynelle Trujillo has retired.  Considering doing a nerve stimulation to see if this would help.  Has done a recent renal ultrasound.    She has been using some vaginal estrogen for the last couple months.  Has been on multiple medications and this has not helped.  No vaginal bleeding.      PCP:  Dr. Osborne Trujillo.  Was seen earlier this year.  Patient's last menstrual period was 07/11/1999.          Sexually active: No.  The current method of family planning is post menopausal status.    Exercising: Yes.    water aerobics, yoga Smoker:  no  Health Maintenance: Pap:  07/24/14 Neg   01/28/10 Neg  History of abnormal Pap:  yes MMG:  11/29/16 BIRADS1:neg  Colonoscopy:  04/08/12 polyps. F/u 5 years. BMD:   04/20/13 Normal  TDaP:  PCP Pneumonia vaccine(s):  2010 Zostavax:   2010, has order for new shingles vaccination Hep C testing: not indicated Screening Labs: PCP  UA: RBC: Trace    reports that she has never smoked. She has never used smokeless tobacco. She reports that she does not drink alcohol or use drugs.  Past Medical History:  Diagnosis Date  . Breast cyst    Aspirated  . Chest pain   . Chronic UTI   . Fibroid   . GERD (gastroesophageal reflux disease)   . Hematuria   . Hepatitis    age 62  . Insomnia   . Kidney stones 1999  . Nonpuerperal mastitis 11/91  . OAB (overactive bladder)   . Spinal stenosis   . TMJ (sprain of temporomandibular joint)     Past Surgical History:  Procedure Laterality Date  . APPENDECTOMY  1959  . BREAST CYST ASPIRATION    . CATARACT EXTRACTION Right 09/2015   Vanessa Trujillo Trujillo  . CHOLECYSTECTOMY  1988  . Hysteroscopic  resection  04/1999   small fibroid polyps  . LITHOTRIPSY     stent  . TONSILLECTOMY AND ADENOIDECTOMY  1946  . TUBAL LIGATION  1979   BTL    Current Outpatient Prescriptions  Medication Sig Dispense Refill  . aspirin EC 81 MG EC tablet Take 1 tablet (81 mg total) by mouth daily. 30 tablet 0  . Cholecalciferol (VITAMIN D PO) Take 1 capsule by mouth 2 (two) times daily. 1000IU 2 x daily     . citalopram (CELEXA) 20 MG tablet TAKE 1 TABLET BY MOUTH EVERY DAY 90 tablet 0  . methenamine (HIPREX) 1 g tablet Take 1 g by mouth 2 (two) times daily.  6  . Multiple Vitamins-Minerals (PRESERVISION AREDS 2) CAPS Take 1 capsule by mouth 2 (two) times daily.     Marland Kitchen omeprazole (PRILOSEC) 20 MG capsule Take 20 mg by mouth daily.    . Pitavastatin Calcium (LIVALO) 2 MG TABS Take 2 mg by mouth daily.     No current facility-administered medications for this visit.     Family History  Problem Relation Age of Onset  . Breast cancer Mother 46  . Heart failure Father   . Emphysema Father  Died age 39  . Breast cancer Maternal Grandmother 50    ROS:  Pertinent items are noted in HPI.  Otherwise, a comprehensive ROS was negative.  Exam:   BP 116/70 (BP Location: Right Arm, Patient Position: Sitting, Cuff Size: Normal)   Pulse 80   Resp 16   Ht 5' 4.25" (1.632 m)   Wt 143 lb (64.9 kg)   LMP 07/11/1999   BMI 24.36 kg/m    Height: 5' 4.25" (163.2 cm)  Ht Readings from Last 3 Encounters:  01/05/17 5' 4.25" (1.632 m)  06/16/16 5' 5.5" (1.664 m)  09/21/15 5' 4.5" (1.638 m)    General appearance: alert, cooperative and appears stated age Head: Normocephalic, without obvious abnormality, atraumatic Neck: no adenopathy, supple, symmetrical, trachea midline and thyroid normal to inspection and palpation Lungs: clear to auscultation bilaterally Breasts: normal appearance, no masses or tenderness Heart: regular rate and rhythm Abdomen: soft, non-tender; bowel sounds normal; no masses,  no  organomegaly Extremities: extremities normal, atraumatic, no cyanosis or edema Skin: Skin color, texture, turgor normal. No rashes or lesions Lymph nodes: Cervical, supraclavicular, and axillary nodes normal. No abnormal inguinal nodes palpated Neurologic: Grossly normal   Pelvic: External genitalia:  no lesions              Urethra:  normal appearing urethra with no masses, tenderness or lesions              Bartholins and Skenes: normal                 Vagina: normal appearing vagina with normal color and discharge, no lesions              Cervix: no lesions              Pap taken: No. Bimanual Exam:  Uterus:  normal size, contour, position, consistency, mobility, non-tender              Adnexa: normal adnexa and no mass, fullness, tenderness               Rectovaginal: Confirms               Anus:  normal sphincter tone, no lesions  Chaperone was present for exam.  A:  Well Woman with normal exam PMP, no HRT OAB with incontinence H/O recurrent UTIs, followed by Dr. Matilde Trujillo.  H/O Cystoscopy and CT in 2014. H/O ovarian cysts.  Last ultrasound 2014.  This has been followed for years and was stable.    P:   Mammogram guidelines reviewed pap smear not indicated Lab work and vaccinations with Dr. Osborne Trujillo.  Celexa 20mg  daily.  #90/4RF.  Pt is considering referral to urogynecologist.  Will let me know if desires referral.  Would send to Dr. Judeth Trujillo at [facility]. return annually or prn

## 2017-04-01 ENCOUNTER — Other Ambulatory Visit: Payer: Self-pay | Admitting: Obstetrics & Gynecology

## 2017-12-18 ENCOUNTER — Encounter: Payer: Self-pay | Admitting: Obstetrics & Gynecology

## 2018-01-11 ENCOUNTER — Ambulatory Visit: Payer: Self-pay | Admitting: Obstetrics & Gynecology

## 2018-01-29 ENCOUNTER — Other Ambulatory Visit (HOSPITAL_COMMUNITY)
Admission: RE | Admit: 2018-01-29 | Discharge: 2018-01-29 | Disposition: A | Discharge status: Home / Self Care | Payer: Medicare Other | Source: Ambulatory Visit | Attending: Obstetrics & Gynecology | Admitting: Obstetrics & Gynecology

## 2018-01-29 ENCOUNTER — Encounter: Payer: Self-pay | Admitting: Obstetrics & Gynecology

## 2018-01-29 ENCOUNTER — Ambulatory Visit: Payer: Medicare Other | Admitting: Obstetrics & Gynecology

## 2018-01-29 VITALS — BP 100/60 | HR 96 | Resp 16 | Ht 64.5 in | Wt 135.0 lb

## 2018-01-29 DIAGNOSIS — Z01419 Encounter for gynecological examination (general) (routine) without abnormal findings: Secondary | ICD-10-CM

## 2018-01-29 DIAGNOSIS — Z124 Encounter for screening for malignant neoplasm of cervix: Secondary | ICD-10-CM | POA: Diagnosis present

## 2018-01-29 MED ORDER — CITALOPRAM HYDROBROMIDE 20 MG PO TABS: 20.0000 mg | ORAL_TABLET | Freq: Every day | ORAL | 4 refills | Status: DC

## 2018-01-29 NOTE — Progress Notes (Addendum)
77 y.o. G1P1 WidowedCaucasianF here for annual exam.  Doing well.  Was admitted in December for possible heart attack.  Work-up was negative.  In May, she reports she started having aching muscles and extreme fatigue.  Denies fever.  Stopped statin.  This didn't help.  Did see [name] and then rheumatologist.  Blood work and evaluation has all been normal.  She is feeling some improvement.    Bladder function is better.    Has not been able to exercise during the past few months due to the fatigue she's experienced.  Cancelled two trips this summer due to not feeling like driving.  Denies vaginal bleeding.  She did try to wean off her Celexa.  Was successful with this.  Estranged brother was found dead in [place].  She had to handle the estate.    PCP:  [name]  Patient's last menstrual period was 07/11/1999.          Sexually active: No.  The current method of family planning is post menopausal status.    Exercising: Yes.    water aerobics, yoga Smoker:  no  Health Maintenance: Pap:  07/24/14 Neg  History of abnormal Pap:  yes MMG:  11/30/17 BIRADS1:Neg  Colonoscopy:  2019 with Dr. Cristina Gong.  Was negative. Does not need another one.   BMD:   11/30/17 Osteopenia  TDaP:  Current  Pneumonia vaccine(s):  done Shingrix:   2019 Hep C testing: 01/16/18 Screening Labs: PCP    reports that she has never smoked. She has never used smokeless tobacco. She reports that she does not drink alcohol or use drugs.  Past Medical History:  Diagnosis Date  . Breast cyst    Aspirated  . Chest pain   . Chronic UTI   . Fibroid   . GERD (gastroesophageal reflux disease)   . Hematuria   . Hepatitis    age 65  . Insomnia   . Kidney stones 1999  . Nonpuerperal mastitis 11/91  . OAB (overactive bladder)   . Spinal stenosis   . TMJ (sprain of temporomandibular joint)     Past Surgical History:  Procedure Laterality Date  . APPENDECTOMY  1959  . BREAST CYST ASPIRATION    . CATARACT  EXTRACTION Right 09/2015   [name]  . CHOLECYSTECTOMY  1988  . Hysteroscopic resection  04/1999   small fibroid polyps  . LITHOTRIPSY     stent  . TONSILLECTOMY AND ADENOIDECTOMY  1946  . TUBAL LIGATION  1979   BTL    Current Outpatient Medications  Medication Sig Dispense Refill  . Ascorbic Acid (VITAMIN C) 500 MG CAPS daily.    . citalopram (CELEXA) 20 MG tablet Take 1 tablet (20 mg total) by mouth daily. 90 tablet 4  . methenamine (HIPREX) 1 g tablet Take 1 g by mouth 2 (two) times daily.  6  . mirabegron ER (MYRBETRIQ) 50 MG TB24 tablet Take 1 tablet by mouth daily.    . Multiple Vitamins-Minerals (PRESERVISION AREDS 2) CAPS Take 1 capsule by mouth 2 (two) times daily.     . NON FORMULARY Estradiol/Testosterone HRT 36ml 0.02% 3 x weekly    . omeprazole (PRILOSEC) 20 MG capsule Take 20 mg by mouth daily.    Marland Kitchen tolterodine (DETROL LA) 4 MG 24 hr capsule Take 1 capsule by mouth daily.  11   No current facility-administered medications for this visit.     Family History  Problem Relation Age of Onset  . Breast  cancer Mother 71  . Heart failure Father   . Emphysema Father        Died age 20  . Breast cancer Maternal Grandmother 50    Review of Systems  Genitourinary: Positive for hematuria.  Musculoskeletal:       Muscle weakness   All other systems reviewed and are negative.   Exam:   BP 100/60 (BP Location: Right Arm, Patient Position: Sitting, Cuff Size: Normal)   Pulse 96   Resp 16   Ht 5' 4.5" (1.638 m)   Wt 135 lb (61.2 kg)   LMP 07/11/1999   BMI 22.81 kg/m   Height:   Height: 5' 4.5" (163.8 cm)  Ht Readings from Last 3 Encounters:  01/29/18 5' 4.5" (1.638 m)  01/05/17 5' 4.25" (1.632 m)  06/16/16 5' 5.5" (1.664 m)    General appearance: alert, cooperative and appears stated age Head: Normocephalic, without obvious abnormality, atraumatic Neck: no adenopathy, supple, symmetrical, trachea midline and thyroid normal to inspection and palpation Lungs:  clear to auscultation bilaterally Breasts: normal appearance, no masses or tenderness Heart: regular rate and rhythm Abdomen: soft, non-tender; bowel sounds normal; no masses,  no organomegaly Extremities: extremities normal, atraumatic, no cyanosis or edema Skin: Skin color, texture, turgor normal. No rashes or lesions Lymph nodes: Cervical, supraclavicular, and axillary nodes normal. No abnormal inguinal nodes palpated Neurologic: Grossly normal   Pelvic: External genitalia:  no lesions              Urethra:  normal appearing urethra with no masses, tenderness or lesions              Bartholins and Skenes: normal                 Vagina: normal appearing vagina with normal color and discharge, no lesions              Cervix: no lesions              Pap taken: Yes.   Bimanual Exam:  Uterus:  normal size, contour, position, consistency, mobility, non-tender              Adnexa: normal adnexa and no mass, fullness, tenderness               Rectovaginal: Confirms               Anus:  normal sphincter tone, no lesions  Chaperone was present for exam.  A:  Well Woman with normal exam PMP, no HRT OAB with incontinence H/O recurrent UTIs, followed by [name].  H/O cystoscopy and CT 2014. Recent fatigue, maybe related to shingrix vaccination H/O ovarian cysts that was followed with ultrasounds until 2014 Family stressors, back on Celexa  P:   Mammogram guidelines reviewed pap smear obtained today.  Consider stopping after this year. Release of lab work from [name]. Reviewed vaccinations with pt RF for Celexa 20mg  daily.  #90/4RF Return annually or prn

## 2018-01-30 LAB — CYTOLOGY - PAP
Adequacy: ABSENT
Diagnosis: NEGATIVE

## 2018-02-18 ENCOUNTER — Telehealth: Payer: Self-pay | Admitting: Obstetrics & Gynecology

## 2018-02-18 NOTE — Telephone Encounter (Signed)
Patient calling to make sure we received her bloodwork results from [facility].

## 2018-02-18 NOTE — Telephone Encounter (Signed)
Results received and to Dr. Sabra Heck for review.

## 2018-02-18 NOTE — Telephone Encounter (Signed)
Spoke with patient, states she has completed ROI at [facility]/ Dr. Amil Amen for recent labs to be sent to Dr. Sabra Heck for review. Advised RN will f/u with request and return call once received and reviewed by Dr. Sabra Heck.

## 2018-02-18 NOTE — Telephone Encounter (Signed)
Spoke with Estill Bamberg, conformed ROI received, will fax recent labs to [phone].

## 2018-02-19 NOTE — Telephone Encounter (Signed)
Called pt and left detailed message.  Advised I would recommend repeating her ANA if still having a lot of fatigue.  Ok to do here.  Dx:  Fatigue, elevated ANA (antinuclear antibody).  Pt made aware I would be out of the office through the end of the week.

## 2018-02-26 ENCOUNTER — Telehealth: Payer: Self-pay | Admitting: Obstetrics & Gynecology

## 2018-02-26 DIAGNOSIS — R768 Other specified abnormal immunological findings in serum: Secondary | ICD-10-CM

## 2018-02-26 DIAGNOSIS — R5383 Other fatigue: Secondary | ICD-10-CM

## 2018-02-26 NOTE — Telephone Encounter (Signed)
Patient is asking to talk with Dr.Miller's nurse about the message that [place] left for her last week.

## 2018-02-26 NOTE — Telephone Encounter (Signed)
Spoke with patient, calling to schedule labs recommended by Dr. Sabra Heck. See previous telephone encounter dated 02/18/18. Lab appt scheduled for 8/21 at 11am. Patient verbalizes understanding.   Future lab order placed for ANA. Dx: Fatigue and elevated ANA  Routing to provider for final review. Patient is agreeable to disposition. Will close encounter.

## 2018-02-27 ENCOUNTER — Other Ambulatory Visit (INDEPENDENT_AMBULATORY_CARE_PROVIDER_SITE_OTHER): Payer: Medicare Other

## 2018-02-27 DIAGNOSIS — R768 Other specified abnormal immunological findings in serum: Secondary | ICD-10-CM

## 2018-02-27 DIAGNOSIS — R5383 Other fatigue: Secondary | ICD-10-CM

## 2018-02-28 LAB — ANA: ANA: POSITIVE — AB

## 2018-03-05 ENCOUNTER — Telehealth: Payer: Self-pay | Admitting: *Deleted

## 2018-03-05 NOTE — Telephone Encounter (Signed)
Called pt personally and advised of results.  She has rheumatology appt next week and is keeping that appt.  Ok to close encounter.

## 2018-03-05 NOTE — Telephone Encounter (Signed)
Noted message from Dr. Sabra Heck. Will close encounter.

## 2018-03-05 NOTE — Telephone Encounter (Signed)
    Patient calling for lab results 

## 2018-03-05 NOTE — Telephone Encounter (Signed)
Dr. Sabra Heck -please review ANA lab results dated 02/27/18 and advise.

## 2018-08-15 ENCOUNTER — Other Ambulatory Visit: Payer: Self-pay | Admitting: Geriatric Medicine

## 2018-08-15 DIAGNOSIS — E7800 Pure hypercholesterolemia, unspecified: Secondary | ICD-10-CM

## 2018-08-21 ENCOUNTER — Ambulatory Visit
Admission: RE | Admit: 2018-08-21 | Discharge: 2018-08-21 | Disposition: A | Discharge status: Home / Self Care | Payer: Medicare Other | Source: Ambulatory Visit | Attending: Geriatric Medicine | Admitting: Geriatric Medicine

## 2018-08-21 DIAGNOSIS — E7800 Pure hypercholesterolemia, unspecified: Secondary | ICD-10-CM

## 2018-12-17 ENCOUNTER — Encounter: Payer: Self-pay | Admitting: Obstetrics & Gynecology

## 2019-03-15 ENCOUNTER — Other Ambulatory Visit: Payer: Self-pay | Admitting: Obstetrics & Gynecology

## 2019-03-18 NOTE — Telephone Encounter (Signed)
Medication refill request: Celexa Last AEX:  01/29/2018 SM Next AEX: 05/23/2019 Last MMG (if hormonal medication request): 12/13/2018 BIRADS 1 Negative Density C Refill authorized: Pending authorization. #90 with no refills if appropriate. Please advise.

## 2019-05-21 ENCOUNTER — Other Ambulatory Visit: Payer: Self-pay

## 2019-05-23 ENCOUNTER — Other Ambulatory Visit: Payer: Self-pay

## 2019-05-23 ENCOUNTER — Encounter: Payer: Self-pay | Admitting: Obstetrics & Gynecology

## 2019-05-23 ENCOUNTER — Ambulatory Visit (INDEPENDENT_AMBULATORY_CARE_PROVIDER_SITE_OTHER): Payer: Medicare Other | Admitting: Obstetrics & Gynecology

## 2019-05-23 VITALS — BP 118/70 | HR 88 | Temp 97.4°F | Resp 12 | Ht 64.5 in | Wt 136.6 lb

## 2019-05-23 DIAGNOSIS — Z01419 Encounter for gynecological examination (general) (routine) without abnormal findings: Secondary | ICD-10-CM

## 2019-05-23 NOTE — Progress Notes (Signed)
78 y.o. G1P1 Widowed White or Caucasian female here for annual exam.  Denies vaginal bleeding.    Continues to have urinary leakage and is wearing pads every day.  She has not had a UTI for quite some time.    Had diarrhea for about 10 days in early March.  Did see Dr. Cristina Gong.  Is taking Align now daily.    PCP:  Dr. Felipa Eth.  Had blood work done in March.   Patient's last menstrual period was 07/11/1999.          Sexually active: No.  The current method of family planning is post menopausal status.    Exercising: No.  The patient does not participate in regular exercise at present. Smoker:  no  Health Maintenance: Pap:   01/29/18 Neg  07/24/14 Neg  History of abnormal Pap:  yes MMG:  12/13/18 BIRADS 1 negative/density c Colonoscopy:  2019 with Dr. Cristina Gong.  Was negative. Does not need another one.   BMD:   11/30/17 Osteopenia TDaP:  UTD per patient Pneumonia vaccine(s):  06/18/09 Shingrix:   06/18/09 and 11/11/17 (did not complete series due to side effects with first time) Hep C testing: 01/16/18 Neg Screening Labs: PCP   reports that she has never smoked. She has never used smokeless tobacco. She reports that she does not drink alcohol or use drugs.  Past Medical History:  Diagnosis Date  . Breast cyst    Aspirated  . Chest pain   . Chronic UTI   . Fibroid   . GERD (gastroesophageal reflux disease)   . Hematuria   . Hepatitis    age 89  . Insomnia   . Kidney stones 1999  . Nonpuerperal mastitis 11/91  . OAB (overactive bladder)   . Spinal stenosis   . TMJ (sprain of temporomandibular joint)     Past Surgical History:  Procedure Laterality Date  . APPENDECTOMY  1959  . BREAST CYST ASPIRATION    . CATARACT EXTRACTION Right 09/2015   [name]  . CHOLECYSTECTOMY  1988  . Hysteroscopic resection  04/1999   small fibroid polyps  . LITHOTRIPSY     stent  . TONSILLECTOMY AND ADENOIDECTOMY  1946  . TUBAL LIGATION  1979   BTL    Current Outpatient Medications   Medication Sig Dispense Refill  . Ascorbic Acid (VITAMIN C) 500 MG CAPS daily.    Marland Kitchen atorvastatin (LIPITOR) 10 MG tablet Take 10 mg by mouth daily.    . citalopram (CELEXA) 20 MG tablet TAKE 1 TABLET BY MOUTH EVERY DAY 90 tablet 1  . mirabegron ER (MYRBETRIQ) 50 MG TB24 tablet Take 1 tablet by mouth daily.    . Multiple Vitamins-Minerals (PRESERVISION AREDS 2) CAPS Take 1 capsule by mouth 2 (two) times daily.     Marland Kitchen omeprazole (PRILOSEC) 20 MG capsule Take 20 mg by mouth daily.    . Probiotic Product (ALIGN) 4 MG CAPS     . TOLTERODINE TARTRATE ER PO     . trimethoprim (TRIMPEX) 100 MG tablet      No current facility-administered medications for this visit.     Family History  Problem Relation Age of Onset  . Breast cancer Mother 61  . Heart failure Father   . Emphysema Father        Died age 59  . Breast cancer Maternal Grandmother 50    Review of Systems  All other systems reviewed and are negative.   Exam:   BP 118/70 (BP  Location: Right Arm, Patient Position: Sitting, Cuff Size: Normal)   Pulse 88   Temp (!) 97.4 F (36.3 C) (Temporal)   Resp 12   Ht 5' 4.5" (1.638 m)   Wt 136 lb 9.6 oz (62 kg)   LMP 07/11/1999   BMI 23.09 kg/m    Height: 5' 4.5" (163.8 cm)  Ht Readings from Last 3 Encounters:  05/23/19 5' 4.5" (1.638 m)  01/29/18 5' 4.5" (1.638 m)  01/05/17 5' 4.25" (1.632 m)    General appearance: alert, cooperative and appears stated age Head: Normocephalic, without obvious abnormality, atraumatic Neck: no adenopathy, supple, symmetrical, trachea midline and thyroid normal to inspection and palpation Lungs: clear to auscultation bilaterally Breasts: normal appearance, no masses or tenderness Heart: regular rate and rhythm Abdomen: soft, non-tender; bowel sounds normal; no masses,  no organomegaly Extremities: extremities normal, atraumatic, no cyanosis or edema Skin: Skin color, texture, turgor normal. No rashes or lesions Lymph nodes: Cervical,  supraclavicular, and axillary nodes normal. No abnormal inguinal nodes palpated Neurologic: Grossly normal   Pelvic: External genitalia:  no lesions              Urethra:  normal appearing urethra with no masses, tenderness or lesions              Bartholins and Skenes: normal                 Vagina: normal appearing vagina with normal color and discharge, no lesions              Cervix: no lesions              Pap taken: No. Bimanual Exam:  Uterus:  normal size, contour, position, consistency, mobility, non-tender              Adnexa: normal adnexa and no mass, fullness, tenderness               Rectovaginal: Confirms               Anus:  normal sphincter tone, no lesions  Chaperone was present for exam.  A:  Well Woman with normal exam PMP, no HRT OAB with incontinence H/o recurrent UTIsk followed by [name].  H/O cystoscopy and CT 2014 H/O ovarian cysts followed by ultrasound until 2014  P:   Mammogram guidelines reviewed. pap smear neg 2019. Release of lab from Dr. Cyd Silence for Celexa not needed today Vaccines reviewed Return annually or prn

## 2019-06-28 ENCOUNTER — Other Ambulatory Visit: Payer: Self-pay | Admitting: Obstetrics & Gynecology

## 2019-07-31 ENCOUNTER — Ambulatory Visit: Payer: Medicare Other | Attending: Internal Medicine

## 2019-07-31 DIAGNOSIS — Z23 Encounter for immunization: Secondary | ICD-10-CM

## 2019-07-31 NOTE — Progress Notes (Signed)
   Covid-19 Vaccination Clinic  Name:  Vanessa Trujillo    MRN: [id number] DOB: 1940/09/22  07/31/2019  [name] was observed post Covid-19 immunization for 30 minutes based on pre-vaccination screening without incidence. She was provided with Vaccine Information Sheet and instruction to access the V-Safe system.   [name] was instructed to call 911 with any severe reactions post vaccine: Marland Kitchen Difficulty breathing  . Swelling of your face and throat  . A fast heartbeat  . A bad rash all over your body  . Dizziness and weakness    Immunizations Administered    Name Date Dose VIS Date Route   Pfizer COVID-19 Vaccine 07/31/2019  9:48 AM 0.3 mL 06/20/2019 Intramuscular   Manufacturer: [place]   Lot: [id number]   [place]: [id number]

## 2019-08-20 ENCOUNTER — Ambulatory Visit: Payer: Medicare Other | Attending: Internal Medicine

## 2019-08-20 DIAGNOSIS — Z23 Encounter for immunization: Secondary | ICD-10-CM | POA: Insufficient documentation

## 2019-08-20 NOTE — Progress Notes (Signed)
   Covid-19 Vaccination Clinic  Name:  Vanessa Trujillo    MRN: [id number] DOB: [birth year]  08/20/2019  [name] was observed post Covid-19 immunization for 15 minutes without incidence. She was provided with Vaccine Information Sheet and instruction to access the V-Safe system.   [name] was instructed to call 911 with any severe reactions post vaccine: Marland Kitchen Difficulty breathing  . Swelling of your face and throat  . A fast heartbeat  . A bad rash all over your body  . Dizziness and weakness    Immunizations Administered    Name Date Dose VIS Date Route   Pfizer COVID-19 Vaccine 08/20/2019  9:06 AM 0.3 mL 06/20/2019 Intramuscular   Manufacturer: [employer], [employer]   Lot: [id number]   [place]: [id number]

## 2019-09-11 ENCOUNTER — Other Ambulatory Visit: Payer: Self-pay | Admitting: Obstetrics & Gynecology

## 2019-09-11 MED ORDER — CITALOPRAM HYDROBROMIDE 20 MG PO TABS: 20.0000 mg | ORAL_TABLET | Freq: Every day | ORAL | 4 refills | Status: DC

## 2019-09-11 NOTE — Telephone Encounter (Signed)
Kaitlyn from [place] is calling to requesting a new prescription for Celexa 20MG  tablet, to be filled as 90 day supply, be sent to [facility] in [address] Pharmacy updated in [place].

## 2019-09-11 NOTE — Telephone Encounter (Signed)
Medication refill request: Celexa Last AEX:  05/23/19 SM Next AEX: 08/19/20 Last MMG (if hormonal medication request): 12/13/18 BIRADS 1 negative/density c Refill authorized: Please advise on refill

## 2020-01-20 ENCOUNTER — Encounter: Payer: Self-pay | Admitting: Obstetrics & Gynecology

## 2020-04-06 ENCOUNTER — Telehealth: Payer: Self-pay

## 2020-04-06 NOTE — Telephone Encounter (Signed)
Patient is returning a call to Lane. She states she is available after 12 noon today.

## 2020-04-06 NOTE — Telephone Encounter (Signed)
Bone density showing early osteopenia. No treatment needed. Follow up 4-54yrs. Left message for patient to callback.

## 2020-04-06 NOTE — Telephone Encounter (Signed)
Patient notified of results. See lab 

## 2020-04-07 ENCOUNTER — Encounter: Payer: Self-pay | Admitting: Obstetrics & Gynecology

## 2020-07-13 DIAGNOSIS — N3000 Acute cystitis without hematuria: Secondary | ICD-10-CM | POA: Diagnosis not present

## 2020-07-30 DIAGNOSIS — K219 Gastro-esophageal reflux disease without esophagitis: Secondary | ICD-10-CM | POA: Diagnosis not present

## 2020-07-30 DIAGNOSIS — E7800 Pure hypercholesterolemia, unspecified: Secondary | ICD-10-CM | POA: Diagnosis not present

## 2020-08-03 DIAGNOSIS — H04123 Dry eye syndrome of bilateral lacrimal glands: Secondary | ICD-10-CM | POA: Diagnosis not present

## 2020-08-03 DIAGNOSIS — H40013 Open angle with borderline findings, low risk, bilateral: Secondary | ICD-10-CM | POA: Diagnosis not present

## 2020-08-03 DIAGNOSIS — H353131 Nonexudative age-related macular degeneration, bilateral, early dry stage: Secondary | ICD-10-CM | POA: Diagnosis not present

## 2020-08-03 DIAGNOSIS — H43813 Vitreous degeneration, bilateral: Secondary | ICD-10-CM | POA: Diagnosis not present

## 2020-08-17 DIAGNOSIS — K9089 Other intestinal malabsorption: Secondary | ICD-10-CM | POA: Diagnosis not present

## 2020-08-19 ENCOUNTER — Ambulatory Visit: Payer: Medicare Other

## 2020-08-24 DIAGNOSIS — E7800 Pure hypercholesterolemia, unspecified: Secondary | ICD-10-CM | POA: Diagnosis not present

## 2020-08-24 DIAGNOSIS — K219 Gastro-esophageal reflux disease without esophagitis: Secondary | ICD-10-CM | POA: Diagnosis not present

## 2020-09-03 DIAGNOSIS — N3000 Acute cystitis without hematuria: Secondary | ICD-10-CM | POA: Diagnosis not present

## 2020-09-03 DIAGNOSIS — N3281 Overactive bladder: Secondary | ICD-10-CM | POA: Diagnosis not present

## 2020-09-29 DIAGNOSIS — K219 Gastro-esophageal reflux disease without esophagitis: Secondary | ICD-10-CM | POA: Diagnosis not present

## 2020-09-29 DIAGNOSIS — E7800 Pure hypercholesterolemia, unspecified: Secondary | ICD-10-CM | POA: Diagnosis not present

## 2020-11-18 IMAGING — CT CT HEART SCORING
3 series · 13 of 20 positions shown, 15 images · non-contrast
Comparison: Chest radiograph 06/16/2016

CLINICAL DATA: Hypercholesterolemia.

EXAM:
CT HEART FOR CALCIUM SCORING
TECHNIQUE: CT heart was performed using prospective ECG gating.
A non-contrast exam for calcium scoring was performed.
Note that this exam targets the heart and the chest was not imaged
in its entirety.

[Series 2: calcium scoring 2.00 qr36 bestdiast 71% · axial · 0.39mm/px · z∈[+1670,+1718]mm · 3 of 62 slices shown]
[im 13/62  vessel]
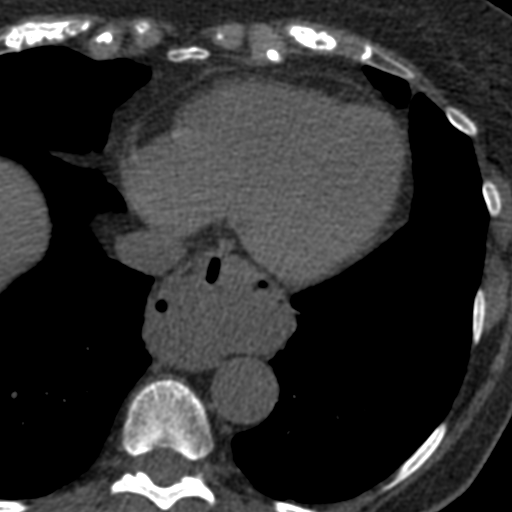
[im 25/62  vessel]
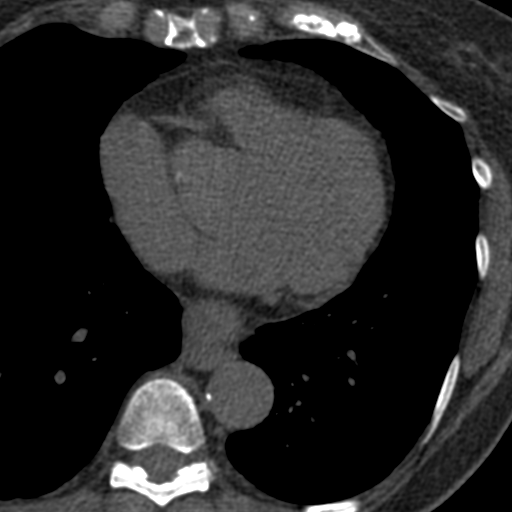
[im 37/62  vessel]
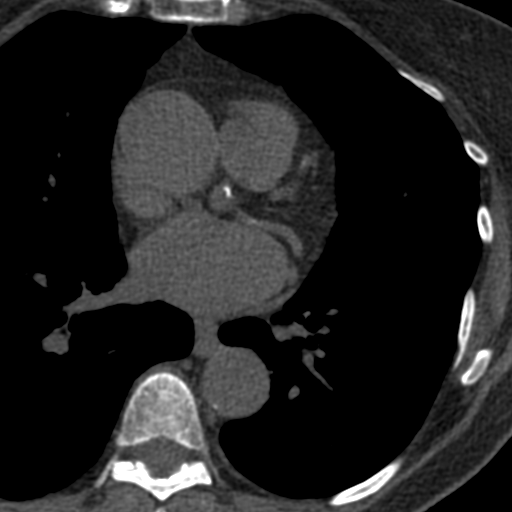

[Series 3: calcium scoring 2.00 br40 bestdiast 71% ax fov · axial · 0.56mm/px · z∈[+1652,+1744]mm · 5 of 70 slices shown, 7 images]
[im 12/70  vessel]
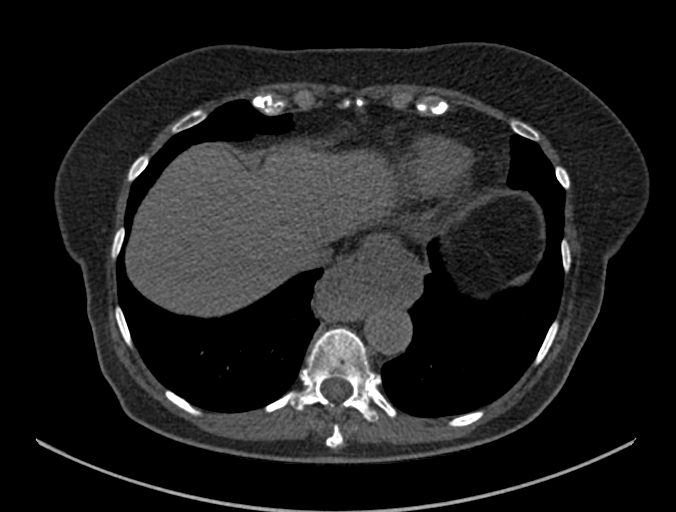
[im 12/70  lung]
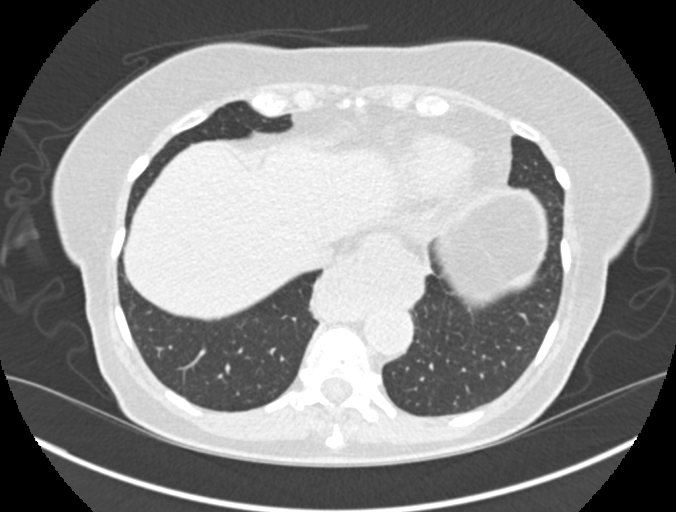
[im 24/70  vessel]
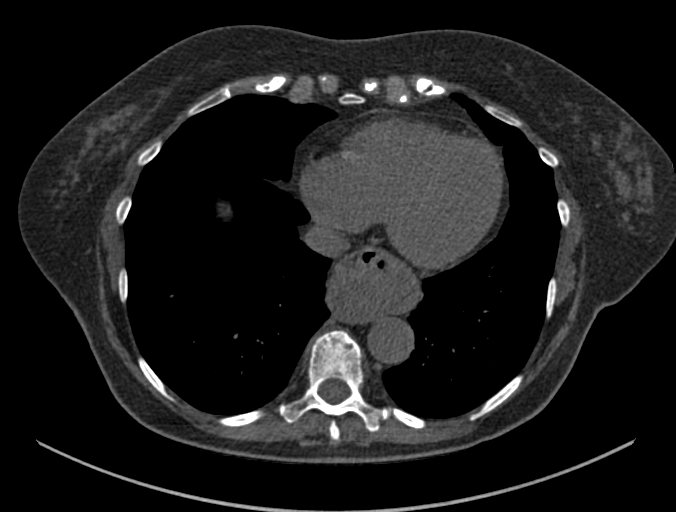
[im 35/70  vessel]
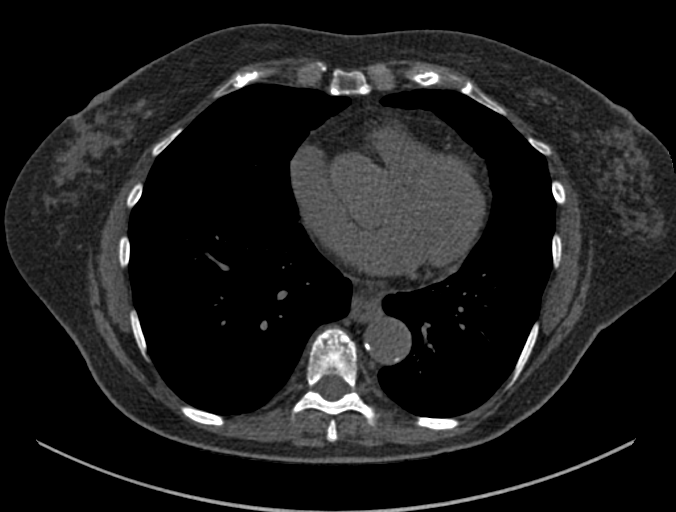
[im 47/70  vessel]
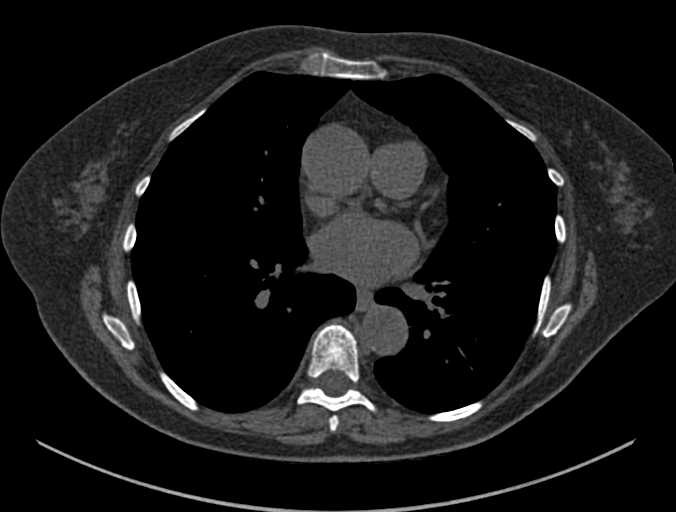
[im 58/70  vessel]
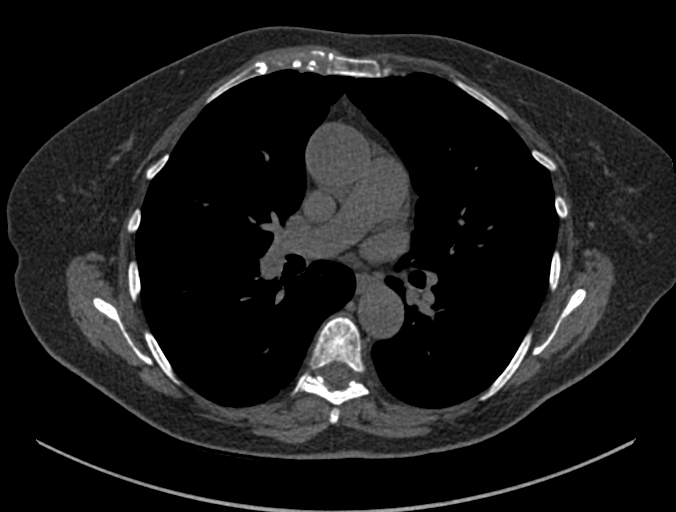
[im 58/70  lung]
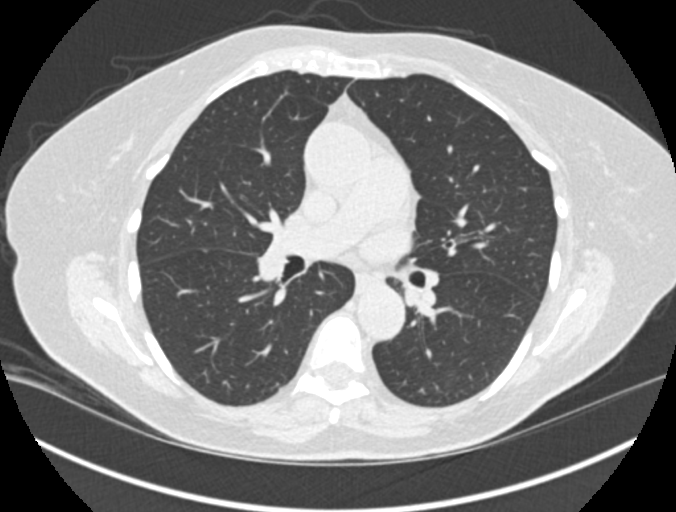

[Series 9: calcium scoring 2.00 br60 bestdiast 71% ax fov · axial · 0.52mm/px · z∈[+1654,+1744]mm · 5 of 69 slices shown]
[im 12/69  vessel]
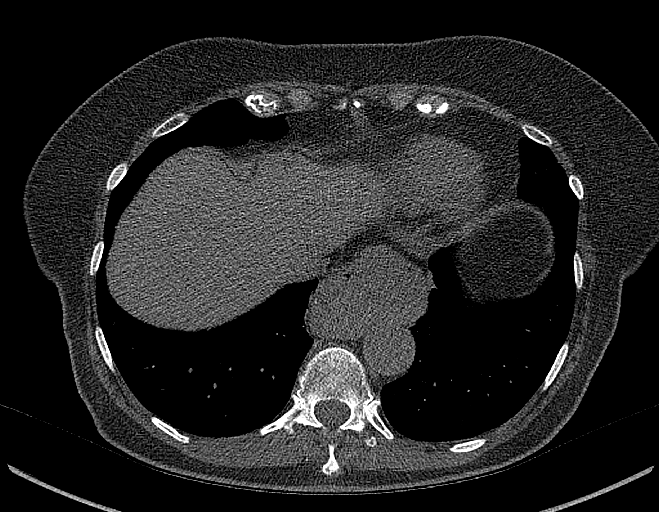
[im 23/69  vessel]
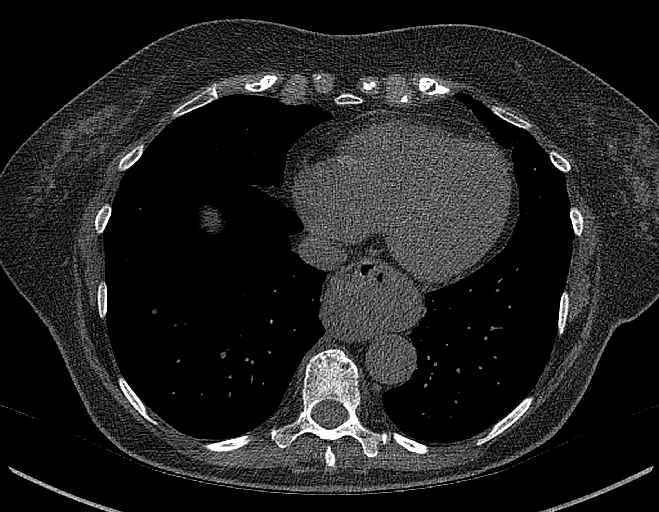
[im 35/69  vessel]
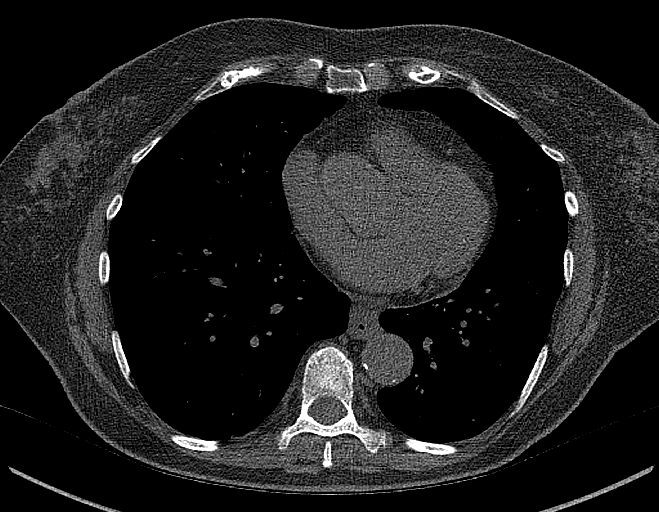
[im 46/69  vessel]
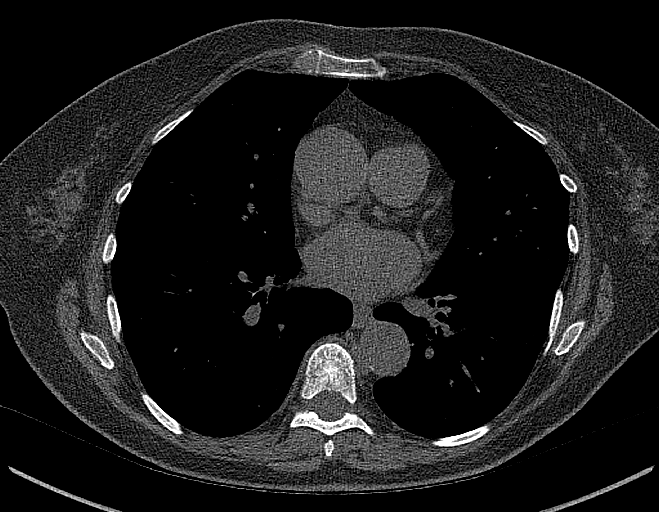
[im 57/69  vessel]
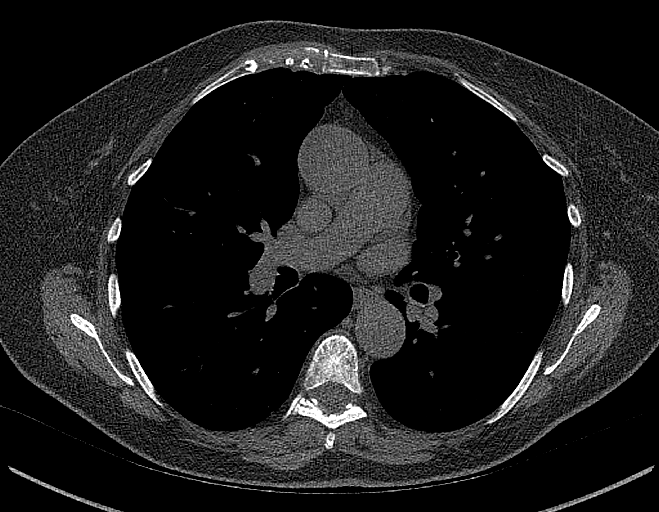

[13 of 20 positions shown; findings below may reference images not displayed]

FINDINGS: Technical quality: Good.

CORONARY CALCIUM

Total Agatston Score: 0.9-possible coronary artery calcification
within the proximal left circumflex on image [DATE].

[HOSPITAL] percentile:  18th

OTHER FINDINGS:

Cardiovascular: Aortic atherosclerosis. Tortuous thoracic aorta.
Mild cardiomegaly, without pericardial effusion.

Mediastinum/Nodes: No mediastinal or definite hilar adenopathy,
given limitations of unenhanced CT. Moderate hiatal hernia.

Lungs/Pleura: No imaged pleural fluid. Focal right-sided pleural
thickening is mild on image 33/3.

A 4 mm right lower lobe pulmonary nodule on image 33/9.

Areas of dependent left lower lobe opacity are favored to represent
foci of subsegmental atelectasis or scar.

Pleural-based left lower lobe 5 mm nodule on image [DATE]
represent a subpleural lymph node.

Upper Abdomen: Normal imaged portions of the liver, spleen.

Musculoskeletal: Mild thoracic spondylosis.
IMPRESSION: 1. Total Agatston score of 0.9, corresponding to 18th percentile for
age, sex, and race based cohort.
2. Moderate hiatal hernia.
3. Bibasilar pulmonary nodules. No follow-up needed if patient is
low-risk. Non-contrast chest CT can be considered in 12 months if
patient is high-risk. This recommendation follows the consensus
statement: Guidelines for Management of Incidental Pulmonary Nodules
Detected on CT Images: From the [HOSPITAL] 4256; Radiology

## 2020-11-23 ENCOUNTER — Other Ambulatory Visit: Payer: Self-pay | Admitting: Obstetrics & Gynecology

## 2020-11-24 NOTE — Telephone Encounter (Signed)
Refill request came for pt.  I did this for #90 days.  She does need breast/pelvic exam.  Was scheduled 08/2020 but cancelled at old office.  Can you call to advise of refill and get scheduled?  Thanks.

## 2020-11-25 NOTE — Telephone Encounter (Signed)
DOB verified. Informed pt that 90 day refill had been sent to her pharmacy. Advised that she would need to make an appt with Dr Sabra Heck since she hasnt been seen since 05/2019.  Pt stated that she would contact her PCP for additional refills. Appt given for 05/25/2021.

## 2020-11-29 DIAGNOSIS — K219 Gastro-esophageal reflux disease without esophagitis: Secondary | ICD-10-CM | POA: Diagnosis not present

## 2020-11-29 DIAGNOSIS — E7800 Pure hypercholesterolemia, unspecified: Secondary | ICD-10-CM | POA: Diagnosis not present

## 2020-12-07 DIAGNOSIS — H40013 Open angle with borderline findings, low risk, bilateral: Secondary | ICD-10-CM | POA: Diagnosis not present

## 2020-12-07 DIAGNOSIS — H04123 Dry eye syndrome of bilateral lacrimal glands: Secondary | ICD-10-CM | POA: Diagnosis not present

## 2020-12-24 DIAGNOSIS — Z1231 Encounter for screening mammogram for malignant neoplasm of breast: Secondary | ICD-10-CM | POA: Diagnosis not present

## 2020-12-28 DIAGNOSIS — E7800 Pure hypercholesterolemia, unspecified: Secondary | ICD-10-CM | POA: Diagnosis not present

## 2020-12-28 DIAGNOSIS — K219 Gastro-esophageal reflux disease without esophagitis: Secondary | ICD-10-CM | POA: Diagnosis not present

## 2021-01-04 ENCOUNTER — Encounter (HOSPITAL_BASED_OUTPATIENT_CLINIC_OR_DEPARTMENT_OTHER): Payer: Self-pay | Admitting: Obstetrics & Gynecology

## 2021-01-04 DIAGNOSIS — N3000 Acute cystitis without hematuria: Secondary | ICD-10-CM | POA: Diagnosis not present

## 2021-02-01 DIAGNOSIS — R350 Frequency of micturition: Secondary | ICD-10-CM | POA: Diagnosis not present

## 2021-02-17 ENCOUNTER — Other Ambulatory Visit: Payer: Self-pay | Admitting: Obstetrics & Gynecology

## 2021-02-23 DIAGNOSIS — K219 Gastro-esophageal reflux disease without esophagitis: Secondary | ICD-10-CM | POA: Diagnosis not present

## 2021-02-23 DIAGNOSIS — E7800 Pure hypercholesterolemia, unspecified: Secondary | ICD-10-CM | POA: Diagnosis not present

## 2021-02-28 DIAGNOSIS — N390 Urinary tract infection, site not specified: Secondary | ICD-10-CM | POA: Diagnosis not present

## 2021-03-03 DIAGNOSIS — N3000 Acute cystitis without hematuria: Secondary | ICD-10-CM | POA: Diagnosis not present

## 2021-03-03 DIAGNOSIS — R8271 Bacteriuria: Secondary | ICD-10-CM | POA: Diagnosis not present

## 2021-03-17 DIAGNOSIS — N3000 Acute cystitis without hematuria: Secondary | ICD-10-CM | POA: Diagnosis not present

## 2021-04-08 DIAGNOSIS — H353131 Nonexudative age-related macular degeneration, bilateral, early dry stage: Secondary | ICD-10-CM | POA: Diagnosis not present

## 2021-04-08 DIAGNOSIS — H40023 Open angle with borderline findings, high risk, bilateral: Secondary | ICD-10-CM | POA: Diagnosis not present

## 2021-04-18 DIAGNOSIS — K9089 Other intestinal malabsorption: Secondary | ICD-10-CM | POA: Diagnosis not present

## 2021-04-18 DIAGNOSIS — K219 Gastro-esophageal reflux disease without esophagitis: Secondary | ICD-10-CM | POA: Diagnosis not present

## 2021-04-18 DIAGNOSIS — Z79899 Other long term (current) drug therapy: Secondary | ICD-10-CM | POA: Diagnosis not present

## 2021-04-18 DIAGNOSIS — E7800 Pure hypercholesterolemia, unspecified: Secondary | ICD-10-CM | POA: Diagnosis not present

## 2021-04-18 DIAGNOSIS — Z1389 Encounter for screening for other disorder: Secondary | ICD-10-CM | POA: Diagnosis not present

## 2021-04-18 DIAGNOSIS — Z0000 Encounter for general adult medical examination without abnormal findings: Secondary | ICD-10-CM | POA: Diagnosis not present

## 2021-04-19 DIAGNOSIS — Z87440 Personal history of urinary (tract) infections: Secondary | ICD-10-CM | POA: Diagnosis not present

## 2021-04-19 DIAGNOSIS — N3281 Overactive bladder: Secondary | ICD-10-CM | POA: Diagnosis not present

## 2021-04-20 DIAGNOSIS — E7800 Pure hypercholesterolemia, unspecified: Secondary | ICD-10-CM | POA: Diagnosis not present

## 2021-04-20 DIAGNOSIS — Z79899 Other long term (current) drug therapy: Secondary | ICD-10-CM | POA: Diagnosis not present

## 2021-04-20 DIAGNOSIS — K9089 Other intestinal malabsorption: Secondary | ICD-10-CM | POA: Diagnosis not present

## 2021-04-27 DIAGNOSIS — N3000 Acute cystitis without hematuria: Secondary | ICD-10-CM | POA: Diagnosis not present

## 2021-05-12 ENCOUNTER — Other Ambulatory Visit: Payer: Self-pay | Admitting: Obstetrics & Gynecology

## 2021-05-20 ENCOUNTER — Ambulatory Visit (HOSPITAL_BASED_OUTPATIENT_CLINIC_OR_DEPARTMENT_OTHER): Payer: Medicare Other | Admitting: Obstetrics & Gynecology

## 2021-05-23 ENCOUNTER — Ambulatory Visit (INDEPENDENT_AMBULATORY_CARE_PROVIDER_SITE_OTHER): Payer: Medicare Other | Admitting: Obstetrics & Gynecology

## 2021-05-23 ENCOUNTER — Encounter (HOSPITAL_BASED_OUTPATIENT_CLINIC_OR_DEPARTMENT_OTHER): Payer: Self-pay | Admitting: Obstetrics & Gynecology

## 2021-05-23 ENCOUNTER — Other Ambulatory Visit: Payer: Self-pay

## 2021-05-23 VITALS — BP 122/80 | HR 83 | Ht 63.0 in | Wt 133.6 lb

## 2021-05-23 DIAGNOSIS — Z780 Asymptomatic menopausal state: Secondary | ICD-10-CM

## 2021-05-23 DIAGNOSIS — F4321 Adjustment disorder with depressed mood: Secondary | ICD-10-CM | POA: Diagnosis not present

## 2021-05-23 DIAGNOSIS — Z87440 Personal history of urinary (tract) infections: Secondary | ICD-10-CM

## 2021-05-23 DIAGNOSIS — N3941 Urge incontinence: Secondary | ICD-10-CM | POA: Diagnosis not present

## 2021-05-23 DIAGNOSIS — Z01419 Encounter for gynecological examination (general) (routine) without abnormal findings: Secondary | ICD-10-CM | POA: Diagnosis not present

## 2021-05-23 MED ORDER — CITALOPRAM HYDROBROMIDE 20 MG PO TABS: 20.0000 mg | ORAL_TABLET | Freq: Every day | ORAL | 4 refills | Status: DC

## 2021-05-23 MED ORDER — METHENAMINE HIPPURATE 1 G PO TABS: 1.0000 g | ORAL_TABLET | Freq: Two times a day (BID) | ORAL | Status: DC

## 2021-05-23 NOTE — Progress Notes (Signed)
80 y.o. G1P1 Widowed White or Caucasian female here for breast and pelvic exam.  Doing well.  Does have hx of recurrent UTIs.  Surprisingly, she did not have one for two years.  So, she thought it was related to not doing water aerobics.  She has been followed by [name].  UTIs seem to have restarted.  She is also having incontinence of urine.  She has seen a TEFL teacher at [employer], Dr. Sharlett Iles.  She has been on prophylactic antibiotics.  Currently on methenamine.  She is going to confirm this dosage for me.  A concern for her is the long term antibiotic use.    Denies vaginal bleeding.  On citalopram.  Doing well with this.  Does not want to change dosage.    Last visit 05/2019.    Continues to have elevated lipids.  Has been on statins.  Cannot tolerate them.    Patient's last menstrual period was 07/11/1999.          Sexually active: No.  H/O STD:  no  Health Maintenance: PCP:  [facility].  Last wellness appt was  last month.  Did blood work at that appt:  yes Vaccines are up to date:  pt aware I do not have tetanus and pneumonia vaccinations Colonoscopy:  2019, Dr. Cristina Gong.  No screening colonoscopy recommended MMG:  12/2020 BMD:  2021, -1.1 Last pap smear:  2019.   H/o abnormal pap smear:  remote hx    reports that she has never smoked. She has never used smokeless tobacco. She reports that she does not drink alcohol and does not use drugs.  Past Medical History:  Diagnosis Date   Breast cyst    Aspirated   Chest pain    Chronic UTI    Fibroid    GERD (gastroesophageal reflux disease)    Hematuria    Hepatitis    age 34   Insomnia    Kidney stones 1999   Nonpuerperal mastitis 11/91   OAB (overactive bladder)    Spinal stenosis    TMJ (sprain of temporomandibular joint)     Past Surgical History:  Procedure Laterality Date   APPENDECTOMY  1959   BREAST CYST ASPIRATION     CATARACT EXTRACTION Right 09/2015   [name]   CHOLECYSTECTOMY  1988   Hysteroscopic  resection  04/1999   small fibroid polyps   LITHOTRIPSY     stent   TONSILLECTOMY AND ADENOIDECTOMY  1946   TUBAL LIGATION  1979   BTL    Current Outpatient Medications  Medication Sig Dispense Refill   Ascorbic Acid (VITAMIN C) 500 MG CAPS daily.     citalopram (CELEXA) 20 MG tablet TAKE ONE TABLET BY MOUTH ONCE DAILY 90 tablet 0   methenamine (HIPREX) 1 g tablet Take 1 tablet (1 g total) by mouth 2 (two) times daily with a meal.     mirabegron ER (MYRBETRIQ) 50 MG TB24 tablet Take 1 tablet by mouth daily.     omeprazole (PRILOSEC) 20 MG capsule Take 20 mg by mouth daily.     Probiotic Product (ALIGN) 4 MG CAPS      atorvastatin (LIPITOR) 10 MG tablet Take 10 mg by mouth daily. (Patient not taking: Reported on 05/23/2021)     No current facility-administered medications for this visit.    Family History  Problem Relation Age of Onset   Breast cancer Mother 51   Heart failure Father    Emphysema Father  Died age 39   Breast cancer Maternal Grandmother 55    Review of Systems  All other systems reviewed and are negative.  Exam:   BP 122/80 (BP Location: Left Arm, Patient Position: Sitting, Cuff Size: Normal)   Pulse 83   Ht 5\' 3"  (1.6 m)   Wt 133 lb 9.6 oz (60.6 kg)   LMP 07/11/1999   BMI 23.67 kg/m   Height: 5\' 3"  (160 cm)  General appearance: alert, cooperative and appears stated age Breasts: normal appearance, no masses or tenderness Abdomen: soft, non-tender; bowel sounds normal; no masses,  no organomegaly Lymph nodes: Cervical, supraclavicular, and axillary nodes normal.  No abnormal inguinal nodes palpated Neurologic: Grossly normal  Pelvic: External genitalia:  no lesions              Urethra:  normal appearing urethra with no masses, tenderness or lesions              Bartholins and Skenes: normal                 Vagina: normal appearing vagina with atrophic changes and no discharge, no lesions              Cervix: no lesions              Pap taken:  No. Bimanual Exam:  Uterus:  normal size, contour, position, consistency, mobility, non-tender              Adnexa: normal adnexa and no mass, fullness, tenderness               Rectovaginal: Confirms               Anus:  normal sphincter tone, no lesions  Chaperone, Octaviano Batty, CMA, was present for exam.  Assessment/Plan: 1. Encntr for gyn exam (general) (routine) w/o abn findings - pap not indicated - MMG 12/2020 - colonoscopy 2019 - BMD up to date - vaccines reviewed - lab work done Dr. Felipa Eth last month  2. Postmenopausal - no HRT  3. Urge incontinence - on Myrbetriq  4. History of recurrent UTIs - followed by urogynecologist  5. Adjustment disorder with depressed mood - RF for citalopram 20mg  daily.  #90/4RF

## 2021-05-23 NOTE — Patient Instructions (Signed)
Revaree vaginal suppository

## 2021-05-25 ENCOUNTER — Encounter (HOSPITAL_BASED_OUTPATIENT_CLINIC_OR_DEPARTMENT_OTHER): Payer: Self-pay

## 2021-05-25 ENCOUNTER — Other Ambulatory Visit (HOSPITAL_BASED_OUTPATIENT_CLINIC_OR_DEPARTMENT_OTHER): Payer: Self-pay

## 2021-05-26 ENCOUNTER — Other Ambulatory Visit (HOSPITAL_BASED_OUTPATIENT_CLINIC_OR_DEPARTMENT_OTHER): Payer: Self-pay | Admitting: *Deleted

## 2021-05-31 DIAGNOSIS — N3281 Overactive bladder: Secondary | ICD-10-CM | POA: Diagnosis not present

## 2021-05-31 DIAGNOSIS — Z87440 Personal history of urinary (tract) infections: Secondary | ICD-10-CM | POA: Diagnosis not present

## 2021-06-01 DIAGNOSIS — K219 Gastro-esophageal reflux disease without esophagitis: Secondary | ICD-10-CM | POA: Diagnosis not present

## 2021-06-01 DIAGNOSIS — E7800 Pure hypercholesterolemia, unspecified: Secondary | ICD-10-CM | POA: Diagnosis not present

## 2021-06-06 DIAGNOSIS — K219 Gastro-esophageal reflux disease without esophagitis: Secondary | ICD-10-CM | POA: Diagnosis not present

## 2021-06-06 DIAGNOSIS — R300 Dysuria: Secondary | ICD-10-CM | POA: Diagnosis not present

## 2021-06-06 DIAGNOSIS — R197 Diarrhea, unspecified: Secondary | ICD-10-CM | POA: Diagnosis not present

## 2021-08-09 DIAGNOSIS — H35412 Lattice degeneration of retina, left eye: Secondary | ICD-10-CM | POA: Diagnosis not present

## 2021-08-09 DIAGNOSIS — H40023 Open angle with borderline findings, high risk, bilateral: Secondary | ICD-10-CM | POA: Diagnosis not present

## 2021-08-09 DIAGNOSIS — H35372 Puckering of macula, left eye: Secondary | ICD-10-CM | POA: Diagnosis not present

## 2021-08-09 DIAGNOSIS — H353131 Nonexudative age-related macular degeneration, bilateral, early dry stage: Secondary | ICD-10-CM | POA: Diagnosis not present

## 2021-08-11 DIAGNOSIS — N390 Urinary tract infection, site not specified: Secondary | ICD-10-CM | POA: Diagnosis not present

## 2021-08-11 DIAGNOSIS — N3020 Other chronic cystitis without hematuria: Secondary | ICD-10-CM | POA: Diagnosis not present

## 2021-08-12 DIAGNOSIS — E7800 Pure hypercholesterolemia, unspecified: Secondary | ICD-10-CM | POA: Diagnosis not present

## 2021-08-12 DIAGNOSIS — K219 Gastro-esophageal reflux disease without esophagitis: Secondary | ICD-10-CM | POA: Diagnosis not present

## 2021-08-15 ENCOUNTER — Telehealth (HOSPITAL_BASED_OUTPATIENT_CLINIC_OR_DEPARTMENT_OTHER): Payer: Self-pay | Admitting: Obstetrics & Gynecology

## 2021-08-15 DIAGNOSIS — N3000 Acute cystitis without hematuria: Secondary | ICD-10-CM | POA: Diagnosis not present

## 2021-08-15 DIAGNOSIS — R300 Dysuria: Secondary | ICD-10-CM | POA: Diagnosis not present

## 2021-08-15 DIAGNOSIS — N200 Calculus of kidney: Secondary | ICD-10-CM | POA: Diagnosis not present

## 2021-08-15 DIAGNOSIS — K5730 Diverticulosis of large intestine without perforation or abscess without bleeding: Secondary | ICD-10-CM | POA: Diagnosis not present

## 2021-08-15 DIAGNOSIS — N281 Cyst of kidney, acquired: Secondary | ICD-10-CM | POA: Diagnosis not present

## 2021-08-15 NOTE — Telephone Encounter (Signed)
Pt called requesting that a referral be placed to Dr. Wannetta Sender. She states that she discussed this with Dr. Sabra Heck at her last visit. Advised that I would send her request to Dr. Sabra Heck to get the referral placed.

## 2021-08-15 NOTE — Telephone Encounter (Signed)
Patient called and would like the nurse to call her about the referral .

## 2021-08-17 ENCOUNTER — Other Ambulatory Visit (HOSPITAL_BASED_OUTPATIENT_CLINIC_OR_DEPARTMENT_OTHER): Payer: Self-pay | Admitting: Obstetrics & Gynecology

## 2021-08-17 DIAGNOSIS — N3281 Overactive bladder: Secondary | ICD-10-CM

## 2021-09-07 DIAGNOSIS — H43813 Vitreous degeneration, bilateral: Secondary | ICD-10-CM | POA: Diagnosis not present

## 2021-09-07 DIAGNOSIS — H43393 Other vitreous opacities, bilateral: Secondary | ICD-10-CM | POA: Diagnosis not present

## 2021-09-07 DIAGNOSIS — H35412 Lattice degeneration of retina, left eye: Secondary | ICD-10-CM | POA: Diagnosis not present

## 2021-09-07 DIAGNOSIS — H35421 Microcystoid degeneration of retina, right eye: Secondary | ICD-10-CM | POA: Diagnosis not present

## 2021-09-12 DIAGNOSIS — R300 Dysuria: Secondary | ICD-10-CM | POA: Diagnosis not present

## 2021-09-13 DIAGNOSIS — H35412 Lattice degeneration of retina, left eye: Secondary | ICD-10-CM | POA: Diagnosis not present

## 2021-09-18 ENCOUNTER — Encounter (HOSPITAL_BASED_OUTPATIENT_CLINIC_OR_DEPARTMENT_OTHER): Payer: Self-pay | Admitting: Obstetrics & Gynecology

## 2021-09-22 ENCOUNTER — Other Ambulatory Visit (HOSPITAL_BASED_OUTPATIENT_CLINIC_OR_DEPARTMENT_OTHER): Payer: Self-pay | Admitting: Obstetrics & Gynecology

## 2021-09-26 DIAGNOSIS — D692 Other nonthrombocytopenic purpura: Secondary | ICD-10-CM | POA: Diagnosis not present

## 2021-09-26 DIAGNOSIS — D1801 Hemangioma of skin and subcutaneous tissue: Secondary | ICD-10-CM | POA: Diagnosis not present

## 2021-09-26 DIAGNOSIS — L570 Actinic keratosis: Secondary | ICD-10-CM | POA: Diagnosis not present

## 2021-09-26 DIAGNOSIS — L821 Other seborrheic keratosis: Secondary | ICD-10-CM | POA: Diagnosis not present

## 2021-09-26 DIAGNOSIS — D485 Neoplasm of uncertain behavior of skin: Secondary | ICD-10-CM | POA: Diagnosis not present

## 2021-09-26 DIAGNOSIS — L814 Other melanin hyperpigmentation: Secondary | ICD-10-CM | POA: Diagnosis not present

## 2021-09-30 DIAGNOSIS — Z03818 Encounter for observation for suspected exposure to other biological agents ruled out: Secondary | ICD-10-CM | POA: Diagnosis not present

## 2021-09-30 DIAGNOSIS — R52 Pain, unspecified: Secondary | ICD-10-CM | POA: Diagnosis not present

## 2021-09-30 DIAGNOSIS — J029 Acute pharyngitis, unspecified: Secondary | ICD-10-CM | POA: Diagnosis not present

## 2021-10-03 DIAGNOSIS — J3489 Other specified disorders of nose and nasal sinuses: Secondary | ICD-10-CM | POA: Diagnosis not present

## 2021-10-03 DIAGNOSIS — R071 Chest pain on breathing: Secondary | ICD-10-CM | POA: Diagnosis not present

## 2021-10-03 DIAGNOSIS — R051 Acute cough: Secondary | ICD-10-CM | POA: Diagnosis not present

## 2021-10-04 ENCOUNTER — Other Ambulatory Visit: Payer: Self-pay | Admitting: Internal Medicine

## 2021-10-04 ENCOUNTER — Ambulatory Visit
Admission: RE | Admit: 2021-10-04 | Discharge: 2021-10-04 | Disposition: A | Discharge status: Home / Self Care | Payer: Medicare Other | Source: Ambulatory Visit | Attending: Internal Medicine | Admitting: Internal Medicine

## 2021-10-04 DIAGNOSIS — R079 Chest pain, unspecified: Secondary | ICD-10-CM | POA: Diagnosis not present

## 2021-10-04 DIAGNOSIS — R071 Chest pain on breathing: Secondary | ICD-10-CM

## 2021-10-18 ENCOUNTER — Other Ambulatory Visit: Payer: Self-pay

## 2021-10-18 ENCOUNTER — Encounter: Payer: Self-pay | Admitting: Infectious Diseases

## 2021-10-18 ENCOUNTER — Ambulatory Visit: Payer: Medicare Other | Admitting: Infectious Diseases

## 2021-10-18 ENCOUNTER — Telehealth: Payer: Self-pay

## 2021-10-18 VITALS — BP 144/80 | HR 86 | Temp 97.9°F | Ht 65.0 in | Wt 138.0 lb

## 2021-10-18 DIAGNOSIS — N390 Urinary tract infection, site not specified: Secondary | ICD-10-CM | POA: Diagnosis not present

## 2021-10-18 DIAGNOSIS — N200 Calculus of kidney: Secondary | ICD-10-CM

## 2021-10-18 DIAGNOSIS — N3281 Overactive bladder: Secondary | ICD-10-CM

## 2021-10-18 MED ORDER — VITAMIN C 1000 MG PO TABS: 1000.0000 mg | ORAL_TABLET | Freq: Four times a day (QID) | ORAL | 2 refills | Status: AC

## 2021-10-18 NOTE — Progress Notes (Signed)
? ?   ? ?Patient Active Problem List  ? Diagnosis Date Noted  ? Depression 06/17/2016  ? Gastroesophageal reflux disease without esophagitis   ? Chest pain at rest 06/16/2016  ? Cough 06/16/2016  ? Hyperglycemia 06/16/2016  ? Calculus of kidney 04/16/2013  ? Urge incontinence 04/16/2013  ? Urgency of micturation 04/16/2013  ? Infection of urinary tract 04/16/2013  ? ?Current Outpatient Medications on File Prior to Visit  ?Medication Sig Dispense Refill  ? citalopram (CELEXA) 20 MG tablet Take 1 tablet (20 mg total) by mouth daily. 90 tablet 4  ? methenamine (HIPREX) 1 g tablet Take 1 tablet (1 g total) by mouth 2 (two) times daily with a meal.    ? mirabegron ER (MYRBETRIQ) 50 MG TB24 tablet Take 1 tablet by mouth daily.    ? omeprazole (PRILOSEC) 20 MG capsule Take 20 mg by mouth daily.    ? Probiotic Product (ALIGN) 4 MG CAPS     ? Vibegron (GEMTESA PO) Take by mouth.    ? ?No current facility-administered medications on file prior to visit.  ? ? ? ? ?Subjective: ?81 Y O Post menopausal female calculus of the kidney, urge urinary incontinence, overactive bladder, lithotripsy stentwho is referred for recurrent UTI per patient's request.  ? ?Has had UTI for " years' and following with [facility] for urinary incontinence. She mostly had 1-2 UTIs in a year but has been having every month since October 2022 and has taken several courses of abtx as listed below. Feels sensation of urgency, burning urination when she has UTI and feels awful. Denies having fevers, chills and suprapubic discomfort. She feels somewhat better when she takes the abtx but also does not feel good on abtx constantly and is seeking an explanation. She had urinary stones in the past that for which she had undergone lithotripsy and denies having any further stones per last imaging done with [facility]. She also saw [facility] gynecology 11/22 where she was advised to use vaginal estrogen cream but she stopped it after a while. She is  currently taking Gemtesa for overactive bladder, myrbetriq for urge incontinence as well as methenamine/Vitamin C.  ? ?8/22 Nitrofurantoin 1 tab po BID for 7 days  ?9/22 Levofloxacin 1 tab po daily for 5 days  ?10/22 Bactrim 1 tab po daily for 7 days ?11/22 Bactrim 1 tab PO BID for 5 days  ?12/22 Nitrofurantoin daily for 7 days ?2/23 Nitrofurantoin PO BID for 7 days ?3/23 Cephalexin PO TID for 5 days  ? ?Per [facility]( [facility]) ?2017 history of macrobid daily prophyalxis ?History of renal stones ?No UTIs in 2018 while on hiprex and vaginal ERT.  ?11/2017 pan S E Coli ?05/2018 E Coli R Amp, then again E Coli R Amp and Cipro ?07/2018 E Coli R Amp ?09/2018 stopped hiprex and started daily Trimethoprim. Continued vaginal ERT. ?04/2020 MF ?07/2020 MF ?08/2020 pan S E Coli ?01/2021 MF ?02/2021 P Aeruginosa pan S ?04/2021 pos nitrites culture sent by her urologist, she was treated with Bactrim with improvement.  ?05/31/21 TH followup. She is currently not having UTI sxs, she is not on ERT or Hiprex. Reports vagina was swollen and irritated and she was told to DC ERT ? ?Lives by herself, retired. Denies smoking, alcohol and IVDU.  ? ?Review of Systems: ?ROS all systems reviewed and negative except as stated above ? ?Past Medical History:  ?Diagnosis Date  ? Breast cyst   ? Aspirated  ? Chest pain   ?  Chronic UTI   ? Fibroid   ? GERD (gastroesophageal reflux disease)   ? Hematuria   ? Hepatitis   ? age 82  ? Insomnia   ? Kidney stones 1999  ? Nonpuerperal mastitis 11/91  ? OAB (overactive bladder)   ? Spinal stenosis   ? TMJ (sprain of temporomandibular joint)   ? ?Past Surgical History:  ?Procedure Laterality Date  ? APPENDECTOMY  1959  ? BREAST CYST ASPIRATION    ? CATARACT EXTRACTION Right 09/2015  ? [name]  ? CHOLECYSTECTOMY  1988  ? Hysteroscopic resection  04/1999  ? small fibroid polyps  ? LITHOTRIPSY    ? stent  ? TONSILLECTOMY AND ADENOIDECTOMY  1946  ? TUBAL LIGATION  1979  ? BTL  ? ? ? ?Social History   ? ?Tobacco Use  ? Smoking status: Never  ? Smokeless tobacco: Never  ?Vaping Use  ? Vaping Use: Never used  ?Substance Use Topics  ? Alcohol use: No  ? Drug use: No  ? ? ?Family History  ?Problem Relation Age of Onset  ? Breast cancer Mother 50  ? Heart failure Father   ? Emphysema Father   ?     Died age 59  ? Breast cancer Maternal Grandmother 37  ? ? ?Allergies  ?Allergen Reactions  ? Codeine   ?  Chest pain  ? ? ?Health Maintenance  ?Topic Date Due  ? Pneumonia Vaccine 74+ Years old (1 - PCV) 08/15/2005  ? INFLUENZA VACCINE  02/07/2022  ? TETANUS/TDAP  12/22/2025  ? DEXA SCAN  Completed  ? COVID-19 Vaccine  Completed  ? HPV VACCINES  Aged Out  ? Zoster Vaccines- Shingrix  Discontinued  ? ? ?Objective: ?BP (!) 144/80   Pulse 86   Temp 97.9 ?F (36.6 ?C) (Oral)   Ht '5\' 5"'$  (1.651 m)   Wt 138 lb (62.6 kg)   LMP 07/11/1999   SpO2 98%   BMI 22.96 kg/m?  ? ? ?Physical Exam ?Constitutional:   ?   Appearance: Normal appearance.  ?HENT:  ?   Head: Normocephalic and atraumatic.   ?   Mouth: Mucous membranes are moist.  ?Eyes: ?   Conjunctiva/sclera: Conjunctivae normal.  ?   Pupils: Pupils are equal, round ? ?Cardiovascular:  ?   Rate and Rhythm: Normal rate and regular rhythm.  ?   Heart sounds:  ? ?Pulmonary:  ?   Effort: Pulmonary effort is normal.  ?   Breath sounds: Normal breath sounds.  ? ?Abdominal:  ?   General: Non distended  ?   Palpations: soft.  ? ?Musculoskeletal:     ?   General: Normal range of motion.  ? ?Skin: ?   General: Skin is warm and dry.  ?   Comments: ? ?Neurological:  ?   General: grossly non focal  ?   Mental Status: awake, alert and oriented to person, place, and time.  ? ?Psychiatric:     ?   Mood and Affect: Mood normal.  ? ?Lab Results ?Lab Results  ?Component Value Date  ? WBC 8.2 06/16/2016  ? HGB 12.5 06/16/2016  ? HCT 38.1 06/16/2016  ? MCV 91.1 06/16/2016  ? PLT 254 06/16/2016  ?  ?Lab Results  ?Component Value Date  ? CREATININE 0.61 06/16/2016  ? BUN 19 06/16/2016  ? NA 138  06/16/2016  ? K 3.9 06/16/2016  ? CL 104 06/16/2016  ? CO2 28 06/16/2016  ?  ?Lab Results  ?Component Value  Date  ? ALT 28 06/16/2016  ? AST 52 (H) 06/16/2016  ? ALKPHOS 92 06/16/2016  ? BILITOT 0.6 06/16/2016  ?  ?No results found for: CHOL, HDL, LDLCALC, LDLDIRECT, TRIG, CHOLHDL ?No results found for: LABRPR, RPRTITER ?No results found for: HIV1RNAQUANT, HIV1RNAVL, CD4TABS ?  ?CT abdomen/pelvis 04/2013 ?1. Small nonobstructing calculi in the left collecting system, as discussed above.  ?2. Abnormal morphology of a left interpolar calyx. No features to suggest a focal mass, and the finding could relate to prior papillary necrosis. Clinical correlation is therefore advised.  ?3. Renal cysts as well as smaller cortical lesions which are incompletely characterized though statistically most likely to be benign. No solid suspicious renal masses are otherwise evident.  ?4. 3.6 cm complex cystic lesion in the right broad ligament, along the undersurface of the ovary. This may represent an ovarian cyst or complex paraovarian cyst given its location. Consideration should be given to follow up sonography for further evaluation of internal features in this postmenopausal female.  ?5. Numerous additional findings as noted above. ? ? ?Problem List Items Addressed This Visit   ? ?  ? Genitourinary  ? Calculus of kidney - Primary  ? Overactive bladder  ? Recurrent UTI  ? ?Assessment/Plan ?# Recurrent UTI  ?Discussed at length symptoms and signs of true UTI, risk factors of UTI and preventive measures  ? ?# Overactive Bladder ?   Urge Incontinence  ?[facility] ?On Myrbetriq and Vibegron  ? ?# Post menopausal state  ?- Discussed about vaginal estrogen  ? ?Continue methenamine 1gm PO BID. Will increase dose of Vitamin C from '500mg'$  daily to 1gm po qid ?Fu with Urology for management of calculus/urge incontinence as well as overactive bladder ?She is willing to try vaginal estrogen cream  ?Advised adequate amount of  water intake, genital hygiene ?Will request medical records from [facility] ( Urine cx, recent abdominal imaging CT or [place]) ?Fu in 3 months or as needed  ? ?I have personally spent 65  minutes involved in face-to-f

## 2021-10-18 NOTE — Telephone Encounter (Signed)
Faxed signed release of information to [name] at [facility]. Dr. West Bali requesting urine cultures, urinalysis, and either CT or US abdomen.  ? ?AESLP:[phone] ?[phone] ? ? ?Beryle Flock, RN ? ?

## 2021-10-19 ENCOUNTER — Encounter: Payer: Self-pay | Admitting: Infectious Diseases

## 2021-10-19 NOTE — Progress Notes (Addendum)
Urine cx reports from [facility]  ? ?09/12/21 E coli, pan S ?08/13/21 E coli , pan S ?04/30/22 Kleb pneumoniae R ampicillin ?03/17/21 PsA pan S ?03/03/21 PsA, pan S ?08/20/20  E coli, R bactrim  ? ?09/18/2018 E coli, R bactrim  ?07/15/2018 E coli, R ampicillin and bactrim  ?06/20/2018 E coli, R ampsulbactam , ampicillin and bactrim  ?05/31/2018 E coli R ampicillin , ciprofloxacin, levofloxacin, bactrim and doxycycline  ?05/13/2018 E coli, R ampicillin and bactrim ?11/08/2017 E coli R Ampicillin, bactrim ? ?CT abd/pelvis 08/15/21 unremarkable for stones  ?

## 2021-10-19 NOTE — Telephone Encounter (Signed)
Received requested records. Placed in provider box for review.  ? ?Beryle Flock, RN ? ?

## 2021-10-21 DIAGNOSIS — J040 Acute laryngitis: Secondary | ICD-10-CM | POA: Diagnosis not present

## 2021-10-21 DIAGNOSIS — R059 Cough, unspecified: Secondary | ICD-10-CM | POA: Diagnosis not present

## 2021-10-25 DIAGNOSIS — N3001 Acute cystitis with hematuria: Secondary | ICD-10-CM | POA: Diagnosis not present

## 2021-10-25 DIAGNOSIS — R300 Dysuria: Secondary | ICD-10-CM | POA: Diagnosis not present

## 2021-10-25 DIAGNOSIS — J019 Acute sinusitis, unspecified: Secondary | ICD-10-CM | POA: Diagnosis not present

## 2021-10-25 DIAGNOSIS — J40 Bronchitis, not specified as acute or chronic: Secondary | ICD-10-CM | POA: Diagnosis not present

## 2021-11-02 ENCOUNTER — Ambulatory Visit: Payer: Medicare Other | Admitting: Obstetrics and Gynecology

## 2021-11-02 ENCOUNTER — Encounter: Payer: Self-pay | Admitting: Obstetrics and Gynecology

## 2021-11-02 VITALS — BP 113/77 | HR 97 | Ht 64.0 in

## 2021-11-02 DIAGNOSIS — R350 Frequency of micturition: Secondary | ICD-10-CM | POA: Diagnosis not present

## 2021-11-02 DIAGNOSIS — K5900 Constipation, unspecified: Secondary | ICD-10-CM

## 2021-11-02 DIAGNOSIS — N390 Urinary tract infection, site not specified: Secondary | ICD-10-CM | POA: Diagnosis not present

## 2021-11-02 DIAGNOSIS — N3281 Overactive bladder: Secondary | ICD-10-CM | POA: Diagnosis not present

## 2021-11-02 DIAGNOSIS — R051 Acute cough: Secondary | ICD-10-CM | POA: Diagnosis not present

## 2021-11-02 DIAGNOSIS — J301 Allergic rhinitis due to pollen: Secondary | ICD-10-CM | POA: Diagnosis not present

## 2021-11-02 LAB — POCT URINALYSIS DIPSTICK
Appearance: NORMAL
Bilirubin, UA: NEGATIVE
Blood, UA: NEGATIVE
Glucose, UA: NEGATIVE
Ketones, UA: NEGATIVE
Leukocytes, UA: NEGATIVE
Nitrite, UA: NEGATIVE
Protein, UA: NEGATIVE
Spec Grav, UA: 1.01 (ref 1.010–1.025)
Urobilinogen, UA: 0.2 E.U./dL
pH, UA: 6 (ref 5.0–8.0)

## 2021-11-02 MED ORDER — ESTRADIOL 10 MCG VA TABS: 1.0000 | ORAL_TABLET | VAGINAL | 11 refills | Status: DC

## 2021-11-02 NOTE — Patient Instructions (Addendum)
Today we talked about ways to manage bladder urgency such as altering your diet to avoid irritative beverages and foods (bladder diet) as well as attempting to decrease stress and other exacerbating factors.    The Most Bothersome Foods* The Least Bothersome Foods*  Coffee - Regular & Decaf Tea - caffeinated Carbonated beverages - cola, non-colas, diet & caffeine-free Alcohols - Beer, Red Wine, White Wine, Champagne Fruits - Grapefruit, Lemon, Orange, Pineapple Fruit Juices - Cranberry, Grapefruit, Orange, Pineapple Vegetables - Tomato & Tomato Products Flavor Enhancers - Hot peppers, Spicy foods, Chili, Horseradish, Vinegar, Monosodium glutamate (MSG) Artificial Sweeteners - NutraSweet, Sweet 'N Low, Equal (sweetener), Saccharin Ethnic foods - Mexican, Thai, Indian food Water Milk - low-fat & whole Fruits - Bananas, Blueberries, Honeydew melon, Pears, Raisins, Watermelon Vegetables - Broccoli, Brussels Sprouts, Cabbage, Carrots, Cauliflower, Celery, Cucumber, Mushrooms, Peas, Radishes, Squash, Zucchini, White potatoes, Sweet potatoes & yams Poultry - Chicken, Eggs, Turkey, Meat - Beef, Pork, Lamb Seafood - Shrimp, Tuna fish, Salmon Grains - Oat, Rice Snacks - Pretzels, Popcorn  *Friedlander J. et al. Diet and its role in interstitial cystitis/bladder pain syndrome (IC/BPS) and comorbid conditions. [employer]. BJU Int. 2012 Jan 11.   Constipation: Our goal is to achieve formed bowel movements daily or every-other-day.  You may need to try different combinations of the following options to find what works best for you - everybody's body works differently so feel free to adjust the dosages as needed.  Some options to help maintain bowel health include:  Dietary changes (more leafy greens, vegetables and fruits; less processed foods) Fiber supplementation (Benefiber, FiberCon, Metamucil or Psyllium). Start slow and increase gradually to full dose. Over-the-counter agents such as: stool  softeners (Docusate or Colace) and/or laxatives (Miralax, milk of magnesia)  "Power Pudding" is a natural mixture that may help your constipation.  To make blend 1 cup applesauce, 1 cup wheat bran, and 3/4 cup prune juice, refrigerate and then take 1 tablespoon daily with a large glass of water as needed.  

## 2021-11-02 NOTE — Progress Notes (Signed)
[facility] ?New Patient Evaluation and Consultation ? ?Referring Provider: Megan Salon, MD ?PCP: Lajean Manes, MD ?Date of Service: 11/02/2021 ? ?SUBJECTIVE ?Chief Complaint: New Patient (Initial Visit) ?- incontinence ? ?History of Present Illness: Vanessa Trujillo is a 81 y.o. White or Caucasian female seen in consultation at the request of Dr. Sabra Heck for evaluation of recurrent UTI and incontinence.   ? ?Review of records significant for: ?Followed by [facility], [facility] Disease and Urogyn at [facility]. Taking Myrbetriq, Gemtesa, methenamine, vitamin C and vaginal estrogen cream.  ? ?Urine cx reports from [facility]: ?  ?09/12/21 E coli, pan S ?08/13/21 E coli , pan S ?04/30/22 Kleb pneumoniae R ampicillin ?03/17/21 PsA pan S ?03/03/21 PsA, pan S ?08/20/20  E coli, R bactrim  ?  ?09/18/2018 E coli, R bactrim  ?07/15/2018 E coli, R ampicillin and bactrim  ?06/20/2018 E coli, R ampsulbactam , ampicillin and bactrim  ?05/31/2018 E coli R ampicillin , ciprofloxacin, levofloxacin, bactrim and doxycycline  ?05/13/2018 E coli, R ampicillin and bactrim ?11/08/2017 E coli R Ampicillin, bactrim ?  ?CT abd/pelvis 08/15/21 unremarkable for stones  ? ?Urinary Symptoms: ?Leaks urine with going from sitting to standing and with urgency ?Leaks 5-6 time(s) per days.  ?Pad use: 5 pads per day.   ?She is bothered by her UI symptoms. ?Currently on Gemtesa for leakage. No longer taking Myrbetriq. Not sure if the medication is helping her. Has also tried tolterodine.  ?Has done pelvic physical therapy.  ?Has done Botox in bladder 3 times ? ?Day time voids 5-6.  Nocturia: 2-3 times per night to void. ?Voiding dysfunction: she empties her bladder well.  ?does not use a catheter to empty bladder.  ?When urinating, she feels she has no difficulties ?Drinks: coffee with creamer, unsweet decaf tea, water, mini diet coke per day ? ?UTIs: 9 UTI's in the last year.   ?Reports history of kidney or bladder stones ?Taking  methenamine. Has used vaginal estrogen in the past but is not using it currently.  ? ?Pelvic Organ Prolapse Symptoms:                  ?She Denies a feeling of a bulge the vaginal area.  ? ?Bowel Symptom: ?Bowel movements: 2-3 time(s) per week ?Stool consistency: hard ?Straining: yes.  ?Splinting: yes.  ?Incomplete evacuation: yes.  ?She Admits to accidental bowel leakage / fecal incontinence ? - not so much "leakage" but stool is sitting right at opening waiting to be expelled ? Consistency with leakage: solid ?Bowel regimen: stool softener ? ?Sexual Function ?Sexually active: no.  ? ? ?Pelvic Pain ?Denies pelvic pain ? ? ?Past Medical History:  ?Past Medical History:  ?Diagnosis Date  ? Breast cyst   ? Aspirated  ? Chest pain   ? Chronic UTI   ? Fibroid   ? GERD (gastroesophageal reflux disease)   ? Hematuria   ? Hepatitis   ? age 57  ? Insomnia   ? Kidney stones 1999  ? Nonpuerperal mastitis 11/91  ? OAB (overactive bladder)   ? Spinal stenosis   ? TMJ (sprain of temporomandibular joint)   ? ? ? ?Past Surgical History:   ?Past Surgical History:  ?Procedure Laterality Date  ? APPENDECTOMY  1959  ? BREAST CYST ASPIRATION    ? CATARACT EXTRACTION Right 09/2015  ? [name]  ? CHOLECYSTECTOMY  1988  ? Hysteroscopic resection  04/1999  ? small fibroid polyps  ? LITHOTRIPSY    ?  stent  ? [place]  ? TUBAL LIGATION  1979  ? BTL  ? ? ? ?Past OB/GYN History: ?OB History  ?Gravida Para Term Preterm AB Living  ?'1 1       1  '$ ?SAB IAB Ectopic Multiple Live Births  ?        1  ?  ?# Outcome Date GA Lbr Len/2nd Weight Sex Delivery Anes PTL Lv  ?1 Para           ? ? ?Vaginal deliveries: 1,  Forceps/ Vacuum deliveries: 0, Cesarean section: 0 ?Menopausal: Denies vaginal bleeding since menopause ? ? ? ?Medications: She has a current medication list which includes the following prescription(s): vitamin c, citalopram, [START ON 11/03/2021] estradiol, methenamine, mirabegron er, omeprazole, align, and  vibegron.  ? ?Allergies: Patient is allergic to atorvastatin, codeine, pravastatin, rosuvastatin calcium, and zoster vac recomb adjuvanted.  ? ?Social History:  ?Social History  ? ?Tobacco Use  ? Smoking status: Never  ? Smokeless tobacco: Never  ?Vaping Use  ? Vaping Use: Never used  ?Substance Use Topics  ? Alcohol use: No  ? Drug use: No  ? ? ?Family History:   ?Family History  ?Problem Relation Age of Onset  ? Breast cancer Mother 52  ? Heart failure Father   ? Emphysema Father   ?     Died age 90  ? Breast cancer Maternal Grandmother 37  ? ? ? ?Review of Systems: Review of Systems  ?Constitutional:  Negative for fever, malaise/fatigue and weight loss.  ?Respiratory:  Negative for cough, shortness of breath and wheezing.   ?Cardiovascular:  Negative for chest pain, palpitations and leg swelling.  ?Gastrointestinal:  Negative for abdominal pain and blood in stool.  ?Genitourinary:  Negative for dysuria.  ?Musculoskeletal:  Negative for myalgias.  ?Skin:  Negative for rash.  ?Neurological:  Negative for dizziness and headaches.  ?Endo/Heme/Allergies:  Does not bruise/bleed easily.  ?Psychiatric/Behavioral:  Negative for depression. The patient is not nervous/anxious.   ? ? ?OBJECTIVE ?Physical Exam: ?Vitals:  ? 11/02/21 1109  ?BP: 113/77  ?Pulse: 97  ?Height: '5\' 4"'$  (1.626 m)  ? ? ?Physical Exam ?Constitutional:   ?   General: She is not in acute distress. ?Pulmonary:  ?   Effort: Pulmonary effort is normal.  ?Abdominal:  ?   General: There is no distension.  ?   Palpations: Abdomen is soft.  ?   Tenderness: There is no abdominal tenderness. There is no rebound.  ?Musculoskeletal:     ?   General: No swelling. Normal range of motion.  ?Skin: ?   General: Skin is warm and dry.  ?   Findings: No rash.  ?Neurological:  ?   Mental Status: She is alert and oriented to person, place, and time.  ?Psychiatric:     ?   Mood and Affect: Mood normal.     ?   Behavior: Behavior normal.  ? ? ? ?GU / Detailed Urogynecologic  Evaluation:  ?Pelvic Exam: Normal external female genitalia; Bartholin's and Skene's glands normal in appearance; urethral meatus normal in appearance, no urethral masses or discharge.  ? ?CST: negative ? ?Speculum exam reveals normal vaginal mucosa with atrophy. Cervix normal appearance. Uterus normal single, nontender. Adnexa no mass, fullness, tenderness.   ? ? ?Pelvic floor strength I/V ? ?Pelvic floor musculature: Right levator non-tender, Right obturator non-tender, Left levator non-tender, Left obturator non-tender ? ?POP-Q:  ? ?POP-Q ? ?-2  ?  Aa   ?-2 ?                                          Ba  ?-7  ?                                            C  ? ?2.5  ?                                          Gh  ?3.5  ?                                          Pb  ?7.5  ?                                          tvl  ? ?-3  ?                                          Ap  ?-3  ?                                          Bp  ?-7  ?                                            D  ? ? ? ?Rectal Exam:  ?Normal external rectum ? ?Post-Void Residual (PVR) by Bladder Scan: ?In order to evaluate bladder emptying, we discussed obtaining a postvoid residual and she agreed to this procedure. ? ?Procedure: The ultrasound unit was placed on the patient's abdomen in the suprapubic region after the patient had voided. A PVR of 18 ml was obtained by bladder scan. ? ?Laboratory Results: ?POC urine: negative ? ? ?ASSESSMENT AND PLAN ?Ms. Machnik is a 81 y.o. with:  ?1. Overactive bladder   ?2. Urinary frequency   ?3. Recurrent UTI   ?4. Constipation, unspecified constipation type   ? ?OAB/ Constipation ?- Has tried several medications, currently on Gemtesa, which she has seen some improvement with.  ?- Previously tried PTNS and intravesical botox. She is not interested in sacral neuromodulation.  ?- We discussed reduction of bladder irritants- coffee, tea and soda and drinking more water.  ?- Also  reviewed how constipation can make urgency worse. We discussed initiating therapy with increasing fluid intake, and daily miralax in addition to her stool softener.  ? ?2. Recurrent UTI ?- currently being evaluated by infe

## 2021-12-19 DIAGNOSIS — N390 Urinary tract infection, site not specified: Secondary | ICD-10-CM | POA: Diagnosis not present

## 2021-12-30 ENCOUNTER — Ambulatory Visit: Payer: Medicare Other | Admitting: Obstetrics and Gynecology

## 2021-12-30 ENCOUNTER — Encounter: Payer: Self-pay | Admitting: Obstetrics and Gynecology

## 2021-12-30 VITALS — BP 127/87 | HR 91

## 2021-12-30 DIAGNOSIS — Z1231 Encounter for screening mammogram for malignant neoplasm of breast: Secondary | ICD-10-CM | POA: Diagnosis not present

## 2021-12-30 DIAGNOSIS — N390 Urinary tract infection, site not specified: Secondary | ICD-10-CM

## 2021-12-30 MED ORDER — FOSFOMYCIN TROMETHAMINE 3 G PO PACK: PACK | ORAL | 5 refills | Status: DC

## 2022-01-02 ENCOUNTER — Encounter (HOSPITAL_BASED_OUTPATIENT_CLINIC_OR_DEPARTMENT_OTHER): Payer: Self-pay | Admitting: *Deleted

## 2022-01-02 DIAGNOSIS — K219 Gastro-esophageal reflux disease without esophagitis: Secondary | ICD-10-CM | POA: Diagnosis not present

## 2022-01-02 DIAGNOSIS — E7800 Pure hypercholesterolemia, unspecified: Secondary | ICD-10-CM | POA: Diagnosis not present

## 2022-01-13 DIAGNOSIS — K219 Gastro-esophageal reflux disease without esophagitis: Secondary | ICD-10-CM | POA: Diagnosis not present

## 2022-01-13 DIAGNOSIS — E7800 Pure hypercholesterolemia, unspecified: Secondary | ICD-10-CM | POA: Diagnosis not present

## 2022-01-13 NOTE — Progress Notes (Signed)
Submitted PA for fosfomycin '3mg'$  pack on Cover my Meds. Key: [id number] PA Case ID: [id number] Rx #: [id number] Outcome: PENDING

## 2022-01-13 NOTE — Progress Notes (Signed)
Request Reference Number: [id number]. FOSFOMYCIN POW 3GM is approved through 07/09/2022.  Your patient may now fill this prescription and it will be covered.

## 2022-02-01 ENCOUNTER — Encounter: Payer: Self-pay | Admitting: Infectious Diseases

## 2022-02-01 ENCOUNTER — Other Ambulatory Visit: Payer: Self-pay

## 2022-02-01 ENCOUNTER — Telehealth: Payer: Self-pay

## 2022-02-01 ENCOUNTER — Ambulatory Visit: Payer: Medicare Other | Admitting: Infectious Diseases

## 2022-02-01 VITALS — BP 124/81 | HR 79 | Temp 97.9°F | Wt 136.0 lb

## 2022-02-01 DIAGNOSIS — N390 Urinary tract infection, site not specified: Secondary | ICD-10-CM

## 2022-02-01 DIAGNOSIS — N200 Calculus of kidney: Secondary | ICD-10-CM | POA: Diagnosis not present

## 2022-02-01 DIAGNOSIS — N3281 Overactive bladder: Secondary | ICD-10-CM

## 2022-02-01 NOTE — Progress Notes (Signed)
Patient Active Problem List   Diagnosis Date Noted   Overactive bladder 10/18/2021   Recurrent UTI 10/18/2021   Depression 06/17/2016   Gastroesophageal reflux disease without esophagitis    Chest pain at rest 06/16/2016   Cough 06/16/2016   Hyperglycemia 06/16/2016   Calculus of kidney 04/16/2013   Urge incontinence 04/16/2013   Urgency of micturation 04/16/2013   Infection of urinary tract 04/16/2013   Current Outpatient Medications on File Prior to Visit  Medication Sig Dispense Refill   Ascorbic Acid (VITAMIN C) 1000 MG tablet Take 1 tablet (1,000 mg total) by mouth in the morning, at noon, in the evening, and at bedtime. 120 tablet 2   citalopram (CELEXA) 20 MG tablet Take 1 tablet (20 mg total) by mouth daily. 90 tablet 4   Estradiol (YUVAFEM) 10 MCG TABS vaginal tablet Place 1 tablet (10 mcg total) vaginally 2 (two) times a week. 8 tablet 11   fosfomycin (MONUROL) 3 g PACK Take 1 packet every 10 days. Dissolve packet in 3-4 oz of water and drink immediately. 3 each 5   methenamine (HIPREX) 1 g tablet Take 1 tablet (1 g total) by mouth 2 (two) times daily with a meal.     mirabegron ER (MYRBETRIQ) 50 MG TB24 tablet Take 1 tablet by mouth daily.     omeprazole (PRILOSEC) 20 MG capsule Take 20 mg by mouth daily.     Probiotic Product (ALIGN) 4 MG CAPS      Vibegron (GEMTESA PO) Take by mouth.     No current facility-administered medications on file prior to visit.   Subjective: 81 Y O Post menopausal female calculus of the kidney, urge urinary incontinence, overactive bladder, lithotripsy stentwho is referred for recurrent UTI per patient's request.   Has had UTI for " years' and following with [facility] for urinary incontinence. She mostly had 1-2 UTIs in a year but has been having every month since October 2022 and has taken several courses of abtx as listed below. Feels sensation of urgency, burning urination when she has UTI and feels awful. Denies having  fevers, chills and suprapubic discomfort. She feels somewhat better when she takes the abtx but also does not feel good on abtx constantly and is seeking an explanation. She had urinary stones in the past that for which she had undergone lithotripsy and denies having any further stones per last imaging done with [facility]. She also saw [facility] gynecology 11/22 where she was advised to use vaginal estrogen cream but she stopped it after a while. She is currently taking Gemtesa for overactive bladder, myrbetriq for urge incontinence as well as methenamine/Vitamin C.   8/22 Nitrofurantoin 1 tab po BID for 7 days  9/22 Levofloxacin 1 tab po daily for 5 days  10/22 Bactrim 1 tab po daily for 7 days 11/22 Bactrim 1 tab PO BID for 5 days  12/22 Nitrofurantoin daily for 7 days 2/23 Nitrofurantoin PO BID for 7 days 3/23 Cephalexin PO TID for 5 days   [facility]) 2017 history of macrobid daily prophyalxis History of renal stones No UTIs in 2018 while on hiprex and vaginal ERT.  11/2017 pan S E Coli 05/2018 E Coli R Amp, then again E Coli R Amp and Cipro 07/2018 E Coli R Amp 09/2018 stopped hiprex and started daily Trimethoprim. Continued vaginal ERT. 04/2020 MF 07/2020 MF 08/2020 pan S E Coli 01/2021 MF 02/2021 P Aeruginosa pan S 04/2021 pos nitrites culture  sent by her urologist, she was treated with Bactrim with improvement.  05/31/21 TH followup. She is currently not having UTI sxs, she is not on ERT or Hiprex. Reports vagina was swollen and irritated and she was told to DC ERT  Lives by herself, retired. Denies smoking, alcohol and IVDU.   02/01/22 Here for fu for recurrent UTI. She tells me she was treated for UTI twice at [place] [facility] in may and June 2023. Treated with cephalexin '250mg'$  po tid for 5 days in May and cephalexin 500 mg po qid for 7 days in June. She tells me she was on cephalexin '250mg'$  poi daily for ppx since Feb 2023, started by [facility]  Urology and was stopped in early July by Urogyneoclogy and switched to fosfomycin 3g every 10 days.  She has completed 2 doses of Fosfomycin so far and has not had any UTIs. Seen by [facility] gynecology 4/26 and was started on foxfomycin 3g every 10 days. She is also taking Vitamin C every morning religiously, however misses doses in the afternoon and evening. She stopped taking it in the night as she read somewhere Vitamin C should not be taken at night.  She is also taking methenamine BID as well as toletrodine. She is also applying vaginal estrogen cream. She denies having UTI in July.   As far as her symptoms of UTI, she describes it as urge, mild burning with increased frequency. Denies fevers, chills, suprapubic discomfort. Denies any hematuria or back pain.   Review of Systems: ROS all systems reviewed and negative except as stated above  Past Medical History:  Diagnosis Date   Breast cyst    Aspirated   Chest pain    Chronic UTI    Fibroid    GERD (gastroesophageal reflux disease)    Hematuria    Hepatitis    age 60   Insomnia    Kidney stones 1999   Nonpuerperal mastitis 11/91   OAB (overactive bladder)    Spinal stenosis    TMJ (sprain of temporomandibular joint)    Past Surgical History:  Procedure Laterality Date   APPENDECTOMY  1959   BREAST CYST ASPIRATION     CATARACT EXTRACTION Right 09/2015   [name]   CHOLECYSTECTOMY  1988   Hysteroscopic resection  04/1999   small fibroid polyps   LITHOTRIPSY     stent   TONSILLECTOMY AND ADENOIDECTOMY  1946   TUBAL LIGATION  1979   BTL     Social History   Tobacco Use   Smoking status: Never   Smokeless tobacco: Never  Vaping Use   Vaping Use: Never used  Substance Use Topics   Alcohol use: No   Drug use: No    Family History  Problem Relation Age of Onset   Breast cancer Mother 39   Heart failure Father    Emphysema Father        Died age 38   Breast cancer Maternal Grandmother 46    Allergies  Allergen  Reactions   Atorvastatin     Other reaction(s): muscle   Codeine     Chest pain   Pravastatin     Other reaction(s): muscle pain   Rosuvastatin Calcium     Other reaction(s): muscle pain   Zoster Vac Recomb Adjuvanted     Other reaction(s): ? muscle pain    Health Maintenance  Topic Date Due   Pneumonia Vaccine 42+ Years old (1 - PCV) 08/15/2005   INFLUENZA  VACCINE  02/07/2022   TETANUS/TDAP  12/22/2025   DEXA SCAN  Completed   COVID-19 Vaccine  Completed   HPV VACCINES  Aged Out   Zoster Vaccines- Shingrix  Discontinued    Objective: BP 124/81   Pulse 79   Temp 97.9 F (36.6 C) (Oral)   Wt 136 lb (61.7 kg)   LMP 07/11/1999   BMI 23.34 kg/m     Physical Exam Constitutional:      Appearance: Normal appearance.  HENT:     Head: Normocephalic and atraumatic.      Mouth: Mucous membranes are moist.  Eyes:    Conjunctiva/sclera: Conjunctivae normal.     Pupils: Pupils are equal, round  Cardiovascular:     Rate and Rhythm: Normal rate and regular rhythm.     Heart sounds:   Pulmonary:     Effort: Pulmonary effort is normal.     Breath sounds: Normal breath sounds.   Abdominal:     General: Non distended     Palpations: soft.   Musculoskeletal:        General: Normal range of motion.   Skin:    General: Skin is warm and dry.     Comments:  Neurological:     General: grossly non focal     Mental Status: awake, alert and oriented to person, place, and time.   Psychiatric:        Mood and Affect: Mood normal.   Lab Results Lab Results  Component Value Date   WBC 8.2 06/16/2016   HGB 12.5 06/16/2016   HCT 38.1 06/16/2016   MCV 91.1 06/16/2016   PLT 254 06/16/2016    Lab Results  Component Value Date   CREATININE 0.61 06/16/2016   BUN 19 06/16/2016   NA 138 06/16/2016   K 3.9 06/16/2016   CL 104 06/16/2016   CO2 28 06/16/2016    Lab Results  Component Value Date   ALT 28 06/16/2016   AST 52 (H) 06/16/2016   ALKPHOS 92 06/16/2016    BILITOT 0.6 06/16/2016    No results found for: "CHOL", "HDL", "LDLCALC", "LDLDIRECT", "TRIG", "CHOLHDL" No results found for: "LABRPR", "RPRTITER" No results found for: "HIV1RNAQUANT", "HIV1RNAVL", "CD4TABS"   CT abdomen/pelvis 04/2013 1. Small nonobstructing calculi in the left collecting system, as discussed above.  2. Abnormal morphology of a left interpolar calyx. No features to suggest a focal mass, and the finding could relate to prior papillary necrosis. Clinical correlation is therefore advised.  3. Renal cysts as well as smaller cortical lesions which are incompletely characterized though statistically most likely to be benign. No solid suspicious renal masses are otherwise evident.  4. 3.6 cm complex cystic lesion in the right broad ligament, along the undersurface of the ovary. This may represent an ovarian cyst or complex paraovarian cyst given its location. Consideration should be given to follow up sonography for further evaluation of internal features in this postmenopausal female.  5. Numerous additional findings as noted above.  Problem List Items Addressed This Visit       Genitourinary   Calculus of kidney   Overactive bladder   Recurrent UTI - Primary     Assessment/Plan # Recurrent UTI  - On fosfomycin 3g po every 10 days, started by [facility] gynecology ( early July 2023), planned for 6 months. Reasonable to continue as planned - On methenamine and vitamin  as ordered. No C/I for Vitamin C at night time although can be taken with food to help  with acidity - Discussed preventive measures to prevent UTI like adequate water intake, good genital hygiene etc  - Fu in 5-6 months  - Will get urine cultures reports from [facility]  # Overactive Bladder    Urge Incontinence  - [facility] - on Tolterodine and Vibegron  # Post menopausal state  - On Estradiol vaginal cream   I have personally spent 42  minutes involved in face-to-face and  non-face-to-face activities for this patient on the day of the visit including counseling of the patient and coordination of care.  Wilber Oliphant, [facility] 02/01/2022, 11:06 AM

## 2022-02-01 NOTE — Telephone Encounter (Signed)
-----   Message from Rosiland Oz, MD sent at 02/01/2022  1:25 PM EDT ----- Regarding: Fu She had earlier asked me if its OK to take Vitamin C at night time. There is no contraindications as such for vitamin C at night time on my research although morning time is preferred. The only concern is increased acidity when taken in empty stomach and can take it with food to help with acidity.   I called her couple of times and went to voice mail. Please let her know my recommendations.

## 2022-02-09 DIAGNOSIS — E7800 Pure hypercholesterolemia, unspecified: Secondary | ICD-10-CM | POA: Diagnosis not present

## 2022-02-09 DIAGNOSIS — K219 Gastro-esophageal reflux disease without esophagitis: Secondary | ICD-10-CM | POA: Diagnosis not present

## 2022-02-17 DIAGNOSIS — H40023 Open angle with borderline findings, high risk, bilateral: Secondary | ICD-10-CM | POA: Diagnosis not present

## 2022-02-17 DIAGNOSIS — H353131 Nonexudative age-related macular degeneration, bilateral, early dry stage: Secondary | ICD-10-CM | POA: Diagnosis not present

## 2022-02-17 DIAGNOSIS — H35412 Lattice degeneration of retina, left eye: Secondary | ICD-10-CM | POA: Diagnosis not present

## 2022-02-17 DIAGNOSIS — H43813 Vitreous degeneration, bilateral: Secondary | ICD-10-CM | POA: Diagnosis not present

## 2022-04-24 DIAGNOSIS — Z0000 Encounter for general adult medical examination without abnormal findings: Secondary | ICD-10-CM | POA: Diagnosis not present

## 2022-04-24 DIAGNOSIS — Z23 Encounter for immunization: Secondary | ICD-10-CM | POA: Diagnosis not present

## 2022-04-24 DIAGNOSIS — Z79899 Other long term (current) drug therapy: Secondary | ICD-10-CM | POA: Diagnosis not present

## 2022-04-24 DIAGNOSIS — E7800 Pure hypercholesterolemia, unspecified: Secondary | ICD-10-CM | POA: Diagnosis not present

## 2022-04-24 DIAGNOSIS — R194 Change in bowel habit: Secondary | ICD-10-CM | POA: Diagnosis not present

## 2022-04-24 DIAGNOSIS — M8580 Other specified disorders of bone density and structure, unspecified site: Secondary | ICD-10-CM | POA: Diagnosis not present

## 2022-04-24 DIAGNOSIS — E538 Deficiency of other specified B group vitamins: Secondary | ICD-10-CM | POA: Diagnosis not present

## 2022-05-01 ENCOUNTER — Encounter: Payer: Self-pay | Admitting: *Deleted

## 2022-05-03 ENCOUNTER — Encounter: Payer: Self-pay | Admitting: Obstetrics and Gynecology

## 2022-05-03 ENCOUNTER — Ambulatory Visit (INDEPENDENT_AMBULATORY_CARE_PROVIDER_SITE_OTHER): Payer: Medicare Other | Admitting: Obstetrics and Gynecology

## 2022-05-03 VITALS — BP 121/84 | HR 93

## 2022-05-03 DIAGNOSIS — N390 Urinary tract infection, site not specified: Secondary | ICD-10-CM

## 2022-05-03 DIAGNOSIS — N3281 Overactive bladder: Secondary | ICD-10-CM

## 2022-05-03 MED ORDER — FOSFOMYCIN TROMETHAMINE 3 G PO PACK: PACK | ORAL | 5 refills | Status: DC

## 2022-05-03 NOTE — Patient Instructions (Signed)
Use estrogen cream twice a week to help prevent urinary tract infections.

## 2022-05-03 NOTE — Progress Notes (Signed)
[facility] Return Visit  SUBJECTIVE  History of Present Illness: Vanessa Trujillo is a 81 y.o. female seen in follow-up for urge incontinence, recurrent UTI and overactive bladder.   Has not had any urinary tract infections since starting the fosfomycin.   Logan Bores is helping during the day, but still needs to wear depends at night. She is also taking tolteridine because she did not know to stop it ([facility]). Has previously tried: bladder botox, PTNS, Myrbetriq.   She is not using the yuvafem estrogen, prefers the vaginal estrogen cream. She is not using the estrogen regularly. She is taking methenamine twice day.    Has done a cystoscopy in the office at [facility]. CT abd/pelvis 08/15/21 unremarkable for stones.    Past Medical History: Patient  has a past medical history of Breast cyst, Chest pain, Chronic UTI, Fibroid, GERD (gastroesophageal reflux disease), Hematuria, Hepatitis, Insomnia, Kidney stones (1999), Nonpuerperal mastitis (11/91), OAB (overactive bladder), Spinal stenosis, and TMJ (sprain of temporomandibular joint).   Past Surgical History: She  has a past surgical history that includes Tonsillectomy and adenoidectomy (1946); Appendectomy (1959); Tubal ligation (1979); Cholecystectomy (1988); Hysteroscopic resection (04/1999); Breast cyst aspiration; Lithotripsy; and Cataract extraction (Right, 09/2015).   Medications: She has a current medication list which includes the following prescription(s): vitamin c, citalopram, estradiol, fosfomycin, methenamine, omeprazole, align, repatha sureclick, tolterodine, and vibegron.   Allergies: Patient is allergic to atorvastatin, codeine, pravastatin, rosuvastatin calcium, and zoster vac recomb adjuvanted.   Social History: Patient  reports that she has never smoked. She has never used smokeless tobacco. She reports that she does not drink alcohol and does not use drugs.      OBJECTIVE     Physical  Exam: Vitals:   05/03/22 1032  BP: 121/84  Pulse: 93    Gen: No apparent distress, A&O x 3.  Detailed Urogynecologic Evaluation:  Deferred.    ASSESSMENT AND PLAN    Vanessa Trujillo is a 81 y.o. with:  1. Overactive bladder   2. Recurrent UTI      OAB - Continue Gemtesa '75mg'$  daily. Stop tolterodine.  - Previously tried Myrbetriq, PTNS and intravesical botox. She is not interested in sacral neuromodulation.   2. Recurrent UTI - Currently taking methenamine BID and vitamin C - Continue Fosfomycin 3g every 10 days for another 6 months since it has been working well for her.  Will plan to stop after next visit and reassess.  - Vaginal estrogen cream twice weekly, does not need a refill at this time. Discussed importance of vaginal estrogen for prevention of UTIs.   Return 6 months  Jaquita Folds, MD  Time spent: I spent 25 minutes dedicated to the care of this patient on the date of this encounter to include pre-visit review of records, face-to-face time with the patient  and post visit documentation and ordering medication/ testing.

## 2022-05-04 ENCOUNTER — Encounter: Payer: Self-pay | Admitting: Obstetrics and Gynecology

## 2022-05-04 DIAGNOSIS — N390 Urinary tract infection, site not specified: Secondary | ICD-10-CM

## 2022-05-04 NOTE — Telephone Encounter (Signed)
LM on theVM for the patient to call me back

## 2022-05-08 DIAGNOSIS — K219 Gastro-esophageal reflux disease without esophagitis: Secondary | ICD-10-CM | POA: Diagnosis not present

## 2022-05-08 DIAGNOSIS — E7800 Pure hypercholesterolemia, unspecified: Secondary | ICD-10-CM | POA: Diagnosis not present

## 2022-05-23 ENCOUNTER — Telehealth: Payer: Self-pay | Admitting: Obstetrics and Gynecology

## 2022-05-23 MED ORDER — METHENAMINE HIPPURATE 1 G PO TABS
1.0000 g | ORAL_TABLET | Freq: Two times a day (BID) | ORAL | 11 refills | Status: DC
Start: 2022-05-23 — End: 2023-06-01

## 2022-05-24 NOTE — Telephone Encounter (Signed)
Chart opened in error

## 2022-05-25 DIAGNOSIS — Z780 Asymptomatic menopausal state: Secondary | ICD-10-CM | POA: Diagnosis not present

## 2022-06-06 ENCOUNTER — Encounter (HOSPITAL_BASED_OUTPATIENT_CLINIC_OR_DEPARTMENT_OTHER): Payer: Self-pay | Admitting: *Deleted

## 2022-06-11 ENCOUNTER — Encounter: Payer: Self-pay | Admitting: Obstetrics and Gynecology

## 2022-06-21 ENCOUNTER — Other Ambulatory Visit: Payer: Self-pay | Admitting: Physician Assistant

## 2022-06-21 ENCOUNTER — Ambulatory Visit
Admission: RE | Admit: 2022-06-21 | Discharge: 2022-06-21 | Disposition: A | Discharge status: Home / Self Care | Payer: Medicare Other | Source: Ambulatory Visit | Attending: Physician Assistant | Admitting: Physician Assistant

## 2022-06-21 DIAGNOSIS — R197 Diarrhea, unspecified: Secondary | ICD-10-CM

## 2022-06-21 DIAGNOSIS — K449 Diaphragmatic hernia without obstruction or gangrene: Secondary | ICD-10-CM | POA: Diagnosis not present

## 2022-06-21 DIAGNOSIS — R1030 Lower abdominal pain, unspecified: Secondary | ICD-10-CM | POA: Diagnosis not present

## 2022-07-07 DIAGNOSIS — B349 Viral infection, unspecified: Secondary | ICD-10-CM | POA: Diagnosis not present

## 2022-07-16 DIAGNOSIS — J0100 Acute maxillary sinusitis, unspecified: Secondary | ICD-10-CM | POA: Diagnosis not present

## 2022-07-16 DIAGNOSIS — B349 Viral infection, unspecified: Secondary | ICD-10-CM | POA: Diagnosis not present

## 2022-08-04 ENCOUNTER — Other Ambulatory Visit (HOSPITAL_BASED_OUTPATIENT_CLINIC_OR_DEPARTMENT_OTHER): Payer: Self-pay | Admitting: Obstetrics & Gynecology

## 2022-08-08 DIAGNOSIS — H35421 Microcystoid degeneration of retina, right eye: Secondary | ICD-10-CM | POA: Diagnosis not present

## 2022-08-08 DIAGNOSIS — H40021 Open angle with borderline findings, high risk, right eye: Secondary | ICD-10-CM | POA: Diagnosis not present

## 2022-08-08 DIAGNOSIS — H353132 Nonexudative age-related macular degeneration, bilateral, intermediate dry stage: Secondary | ICD-10-CM | POA: Diagnosis not present

## 2022-08-08 DIAGNOSIS — H35372 Puckering of macula, left eye: Secondary | ICD-10-CM | POA: Diagnosis not present

## 2022-08-08 DIAGNOSIS — H43393 Other vitreous opacities, bilateral: Secondary | ICD-10-CM | POA: Diagnosis not present

## 2022-08-08 DIAGNOSIS — H31002 Unspecified chorioretinal scars, left eye: Secondary | ICD-10-CM | POA: Diagnosis not present

## 2022-08-08 DIAGNOSIS — H43819 Vitreous degeneration, unspecified eye: Secondary | ICD-10-CM | POA: Diagnosis not present

## 2022-08-09 ENCOUNTER — Other Ambulatory Visit: Payer: Self-pay

## 2022-08-09 ENCOUNTER — Ambulatory Visit: Payer: Medicare Other | Admitting: Infectious Diseases

## 2022-08-09 ENCOUNTER — Encounter: Payer: Self-pay | Admitting: Infectious Diseases

## 2022-08-09 VITALS — BP 133/82 | HR 80 | Temp 97.7°F | Ht 64.5 in | Wt 138.0 lb

## 2022-08-09 DIAGNOSIS — N390 Urinary tract infection, site not specified: Secondary | ICD-10-CM | POA: Diagnosis not present

## 2022-08-09 DIAGNOSIS — N200 Calculus of kidney: Secondary | ICD-10-CM

## 2022-08-09 DIAGNOSIS — N3281 Overactive bladder: Secondary | ICD-10-CM | POA: Diagnosis not present

## 2022-08-09 DIAGNOSIS — Z5181 Encounter for therapeutic drug level monitoring: Secondary | ICD-10-CM | POA: Diagnosis not present

## 2022-08-09 NOTE — Progress Notes (Addendum)
Patient Active Problem List   Diagnosis Date Noted   Overactive bladder 10/18/2021   Recurrent UTI 10/18/2021   Depression 06/17/2016   Gastroesophageal reflux disease without esophagitis    Chest pain at rest 06/16/2016   Cough 06/16/2016   Hyperglycemia 06/16/2016   Calculus of kidney 04/16/2013   Urge incontinence 04/16/2013   Urgency of micturation 04/16/2013   Infection of urinary tract 04/16/2013   Current Outpatient Medications on File Prior to Visit  Medication Sig Dispense Refill   Ascorbic Acid (VITAMIN C) 1000 MG tablet Take 1 tablet (1,000 mg total) by mouth in the morning, at noon, in the evening, and at bedtime. 120 tablet 2   citalopram (CELEXA) 20 MG tablet TAKE ONE TABLET BY MOUTH EVERY MORNING 90 tablet 0   Estradiol (YUVAFEM) 10 MCG TABS vaginal tablet Place 1 tablet (10 mcg total) vaginally 2 (two) times a week. 8 tablet 11   fosfomycin (MONUROL) 3 g PACK Take 1 packet every 10 days. Dissolve packet in 3-4 oz of water and drink immediately. 3 each 5   methenamine (HIPREX) 1 g tablet Take 1 tablet (1 g total) by mouth 2 (two) times daily with a meal. 60 tablet 11   omeprazole (PRILOSEC) 20 MG capsule Take 20 mg by mouth daily.     Probiotic Product (ALIGN) 4 MG CAPS      REPATHA SURECLICK 779 MG/ML SOAJ Inject 1 mL into the skin every 14 (fourteen) days.     tolterodine (DETROL LA) 4 MG 24 hr capsule Take 4 mg by mouth daily.     Vibegron (GEMTESA PO) Take by mouth.     No current facility-administered medications on file prior to visit.   Subjective: 82 Y O Post menopausal female calculus of the kidney, urge urinary incontinence, overactive bladder, lithotripsy stentwho is referred for recurrent UTI per patient's request.   Has had UTI for " years' and following with [facility] for urinary incontinence. She mostly had 1-2 UTIs in a year but has been having every month since October 2022 and has taken several courses of abtx as listed below. Feels  sensation of urgency, burning urination when she has UTI and feels awful. Denies having fevers, chills and suprapubic discomfort. She feels somewhat better when she takes the abtx but also does not feel good on abtx constantly and is seeking an explanation. She had urinary stones in the past that for which she had undergone lithotripsy and denies having any further stones per last imaging done with [facility]. She also saw [facility] gynecology 11/22 where she was advised to use vaginal estrogen cream but she stopped it after a while. She is currently taking Gemtesa for overactive bladder, myrbetriq for urge incontinence as well as methenamine/Vitamin C.   8/22 Nitrofurantoin 1 tab po BID for 7 days  9/22 Levofloxacin 1 tab po daily for 5 days  10/22 Bactrim 1 tab po daily for 7 days 11/22 Bactrim 1 tab PO BID for 5 days  12/22 Nitrofurantoin daily for 7 days 2/23 Nitrofurantoin PO BID for 7 days 3/23 Cephalexin PO TID for 5 days   Per [facility]( [facility]) 2017 history of macrobid daily prophyalxis History of renal stones No UTIs in 2018 while on hiprex and vaginal ERT.  11/2017 pan S E Coli 05/2018 E Coli R Amp, then again E Coli R Amp and Cipro 07/2018 E Coli R Amp 09/2018 stopped hiprex and started daily Trimethoprim. Continued vaginal ERT. 04/2020  MF 07/2020 MF 08/2020 pan S E Coli 01/2021 MF 02/2021 P Aeruginosa pan S 04/2021 pos nitrites culture sent by her urologist, she was treated with Bactrim with improvement.  05/31/21 TH followup. She is currently not having UTI sxs, she is not on ERT or Hiprex. Reports vagina was swollen and irritated and she was told to DC ERT  Lives by herself, retired. Denies smoking, alcohol and IVDU.   02/01/22 Here for fu for recurrent UTI. She tells me she was treated for UTI twice at [facility] in may and June 2023. Treated with cephalexin '250mg'$  po tid for 5 days in May and cephalexin 500 mg po qid for 7 days in June. She tells me  she was on cephalexin '250mg'$  poi daily for ppx since Feb 2023, started by [facility] and was stopped in early July by Urogyneoclogy and switched to fosfomycin 3g every 10 days.  She has completed 2 doses of Fosfomycin so far and has not had any UTIs. Seen by [facility] gynecology 4/26 and was started on foxfomycin 3g every 10 days. She is also taking Vitamin C every morning religiously, however misses doses in the afternoon and evening. She stopped taking it in the night as she read somewhere Vitamin C should not be taken at night.  She is also taking methenamine BID as well as toletrodine. She is also applying vaginal estrogen cream. She denies having UTI in July.   As far as her symptoms of UTI, she describes it as urge, mild burning with increased frequency. Denies fevers, chills, suprapubic discomfort. Denies any hematuria or back pain.   08/09/22 Doing well with no complaints. No UTI since last July. Takes methenamine twice a day and vit C 1g 2 tabs BID with no concerns. She is also following urogynecology and taking Gemtesa as well as Fosfomycin. She is pleased that she has no further UTIs since being co-managed with ID and Uro-gynecology  Review of Systems: ROS all systems reviewed and negative except as stated above  Past Medical History:  Diagnosis Date   Breast cyst    Aspirated   Chest pain    Chronic UTI    Fibroid    GERD (gastroesophageal reflux disease)    Hematuria    Hepatitis    age 53   Insomnia    Kidney stones 1999   Nonpuerperal mastitis 11/91   OAB (overactive bladder)    Spinal stenosis    TMJ (sprain of temporomandibular joint)    Past Surgical History:  Procedure Laterality Date   APPENDECTOMY  1959   BREAST CYST ASPIRATION     CATARACT EXTRACTION Right 09/2015   [name]   CHOLECYSTECTOMY  1988   Hysteroscopic resection  04/1999   small fibroid polyps   LITHOTRIPSY     stent   TONSILLECTOMY AND ADENOIDECTOMY  1946   TUBAL LIGATION  1979   BTL      Social History   Tobacco Use   Smoking status: Never   Smokeless tobacco: Never  Vaping Use   Vaping Use: Never used  Substance Use Topics   Alcohol use: No   Drug use: No    Family History  Problem Relation Age of Onset   Breast cancer Mother 41   Heart failure Father    Emphysema Father        Died age 60   Breast cancer Maternal Grandmother 73    Allergies  Allergen Reactions   Atorvastatin  Other reaction(s): muscle   Codeine     Chest pain   Pravastatin     Other reaction(s): muscle pain   Rosuvastatin Calcium     Other reaction(s): muscle pain   Zoster Vac Recomb Adjuvanted     Other reaction(s): ? muscle pain    Health Maintenance  Topic Date Due   DTaP/Tdap/Td (1 - Tdap) Never done   Pneumonia Vaccine 75+ Years old (1 - PCV) 08/15/2005   INFLUENZA VACCINE  02/07/2022   COVID-19 Vaccine (4 - 2023-24 season) 03/10/2022   Medicare Annual Wellness (AWV)  04/25/2023   DEXA SCAN  Completed   HPV VACCINES  Aged Out   Zoster Vaccines- Shingrix  Discontinued    Objective: BP 133/82   Pulse 80   Temp 97.7 F (36.5 C) (Oral)   Ht 5' 4.5" (1.638 m)   Wt 138 lb (62.6 kg)   LMP 07/11/1999   SpO2 98%   BMI 23.32 kg/m    Physical Exam Constitutional:      Appearance: Normal appearance.  HENT:     Head: Normocephalic and atraumatic.      Mouth: Mucous membranes are moist.  Eyes:    Conjunctiva/sclera: Conjunctivae normal.     Pupils: Pupils are equal, round  Cardiovascular:     Rate and Rhythm: Normal rate and regular rhythm.     Heart sounds:   Pulmonary:     Effort: Pulmonary effort is normal.     Breath sounds: Normal breath sounds.   Abdominal:     General: Non distended     Palpations: soft.   Musculoskeletal:        General: Normal range of motion.   Skin:    General: Skin is warm and dry.     Comments:  Neurological:     General: grossly non focal     Mental Status: awake, alert and oriented to person, place, and  time.   Psychiatric:        Mood and Affect: Mood normal.   Lab Results Lab Results  Component Value Date   WBC 8.2 06/16/2016   HGB 12.5 06/16/2016   HCT 38.1 06/16/2016   MCV 91.1 06/16/2016   PLT 254 06/16/2016    Lab Results  Component Value Date   CREATININE 0.61 06/16/2016   BUN 19 06/16/2016   NA 138 06/16/2016   K 3.9 06/16/2016   CL 104 06/16/2016   CO2 28 06/16/2016    Lab Results  Component Value Date   ALT 28 06/16/2016   AST 52 (H) 06/16/2016   ALKPHOS 92 06/16/2016   BILITOT 0.6 06/16/2016    No results found for: "CHOL", "HDL", "LDLCALC", "LDLDIRECT", "TRIG", "CHOLHDL" No results found for: "LABRPR", "RPRTITER" No results found for: "HIV1RNAQUANT", "HIV1RNAVL", "CD4TABS"   CT abdomen/pelvis 04/2013 1. Small nonobstructing calculi in the left collecting system, as discussed above.  2. Abnormal morphology of a left interpolar calyx. No features to suggest a focal mass, and the finding could relate to prior papillary necrosis. Clinical correlation is therefore advised.  3. Renal cysts as well as smaller cortical lesions which are incompletely characterized though statistically most likely to be benign. No solid suspicious renal masses are otherwise evident.  4. 3.6 cm complex cystic lesion in the right broad ligament, along the undersurface of the ovary. This may represent an ovarian cyst or complex paraovarian cyst given its location. Consideration should be given to follow up sonography for further evaluation of internal features in this  postmenopausal female.  5. Numerous additional findings as noted above.   Assessment/Plan # Recurrent UTI  - CT abd/pelvis 08/15/21 unremarkable for stones per uro-gynecology notes 05/03/22 - On fosfomycin 3g po every 10 days, started by [facility] gyne[facility] ( early July 2023), planned for another 6 months per Urogynecology visit 05/03/22. Plan to stop at next visit and re-assess - On methenamine and vitamin and doing well with no  concerns. Plan to continue for now as she has been doing well with no UTIs.  - Fu in 5-6 months or as needed  # Overactive Bladder    Urge Incontinence  - Last seen by [facility] gynecology 10/25 and continued on gemtesa 75 mg po daily  # Post menopausal state  - On Estradiol vaginal cream   I have personally spent 40 minutes involved in face-to-face and non-face-to-face activities for this patient on the day of the visit including counseling of the patient and coordination of care.  Wilber Oliphant, [place] [facility] 08/09/2022, 10:52 AM

## 2022-09-05 DIAGNOSIS — K219 Gastro-esophageal reflux disease without esophagitis: Secondary | ICD-10-CM | POA: Diagnosis not present

## 2022-09-05 DIAGNOSIS — M8580 Other specified disorders of bone density and structure, unspecified site: Secondary | ICD-10-CM | POA: Diagnosis not present

## 2022-09-05 DIAGNOSIS — E7800 Pure hypercholesterolemia, unspecified: Secondary | ICD-10-CM | POA: Diagnosis not present

## 2022-09-08 DIAGNOSIS — H353131 Nonexudative age-related macular degeneration, bilateral, early dry stage: Secondary | ICD-10-CM | POA: Diagnosis not present

## 2022-09-08 DIAGNOSIS — H35372 Puckering of macula, left eye: Secondary | ICD-10-CM | POA: Diagnosis not present

## 2022-09-08 DIAGNOSIS — H35412 Lattice degeneration of retina, left eye: Secondary | ICD-10-CM | POA: Diagnosis not present

## 2022-09-08 DIAGNOSIS — H40023 Open angle with borderline findings, high risk, bilateral: Secondary | ICD-10-CM | POA: Diagnosis not present

## 2022-09-18 ENCOUNTER — Telehealth: Payer: Self-pay

## 2022-09-18 MED ORDER — GEMTESA 75 MG PO TABS: 75.0000 mg | ORAL_TABLET | Freq: Every day | ORAL | 1 refills | Status: DC

## 2022-09-18 NOTE — Telephone Encounter (Signed)
Vanessa Trujillo is a 82 y.o. female called in because [facility] never received a prescription from Dr. Wannetta Sender. I told the patient I would send it again.

## 2022-10-09 ENCOUNTER — Ambulatory Visit (INDEPENDENT_AMBULATORY_CARE_PROVIDER_SITE_OTHER): Payer: Medicare Other

## 2022-10-09 DIAGNOSIS — R300 Dysuria: Secondary | ICD-10-CM | POA: Diagnosis not present

## 2022-10-09 DIAGNOSIS — R8279 Other abnormal findings on microbiological examination of urine: Secondary | ICD-10-CM | POA: Diagnosis not present

## 2022-10-09 DIAGNOSIS — R350 Frequency of micturition: Secondary | ICD-10-CM

## 2022-10-09 NOTE — Progress Notes (Signed)
Vanessa Trujillo is a 82 y.o. female came in for possible UTI sx. Pt tried to urinate and could not. Pt was scanned for 40mL. Pt sais she feels like she has  a kidney stone. Pt use to see [name] and will f/u with [facility]

## 2022-10-20 DIAGNOSIS — R3915 Urgency of urination: Secondary | ICD-10-CM | POA: Diagnosis not present

## 2022-10-20 DIAGNOSIS — R350 Frequency of micturition: Secondary | ICD-10-CM | POA: Diagnosis not present

## 2022-10-20 DIAGNOSIS — R300 Dysuria: Secondary | ICD-10-CM | POA: Diagnosis not present

## 2022-10-23 ENCOUNTER — Ambulatory Visit: Payer: Medicare Other | Admitting: Obstetrics and Gynecology

## 2022-10-25 ENCOUNTER — Ambulatory Visit: Payer: Medicare Other | Admitting: Obstetrics and Gynecology

## 2022-11-01 ENCOUNTER — Encounter: Payer: Self-pay | Admitting: Obstetrics and Gynecology

## 2022-11-01 ENCOUNTER — Ambulatory Visit: Payer: Medicare Other | Admitting: Obstetrics and Gynecology

## 2022-11-01 DIAGNOSIS — N390 Urinary tract infection, site not specified: Secondary | ICD-10-CM

## 2022-11-01 MED ORDER — ESTRADIOL 10 MCG VA TABS: 1.0000 | ORAL_TABLET | VAGINAL | 11 refills | Status: DC

## 2022-11-01 NOTE — Progress Notes (Signed)
 Urogynecology Return Visit  SUBJECTIVE  History of Present Illness: Vanessa Trujillo is a 82 y.o. female seen in follow-up for urge incontinence, recurrent UTI and overactive bladder.   Was seen a few weeks ago for a nurse visit but could not void. Went to [facility] urology because she thought it was maybe a kidney stone. She was put on antibiotics initially but then culture returned negative (catheterized specimen). She was scheduled to get an Xray but got sick and has not done it yet.   Feels like she has some intermittent bladder spasms. Has 1 cup Coffee in AM, decaf unsweet tea (2 glasses/ day) Occasionally has a diet coke. Has not been drinking a lot of water.  Has not had any urinary tract infections since starting the fosfomycin.   Still leaking a lot. Wakes 2-3 times a night and has worse incontinence. Leslye Peer is helping some the day. Has previously tried: bladder botox, PTNS, Myrbetriq, tolterodine.   She feels she gets some irritation from vaginal estrogen cream and is not always using it twice a week. Has a few more doses left of the fosfomycin and has not had a UTI since she has been on the prophylaxis. She is taking methenamine twice day.    Has done a cystoscopy in the office at [facility]. CT abd/pelvis 08/15/21 unremarkable for stones.    Past Medical History: Patient  has a past medical history of Breast cyst, Chest pain, Chronic UTI, Fibroid, GERD (gastroesophageal reflux disease), Hematuria, Hepatitis, Insomnia, Kidney stones (1999), Nonpuerperal mastitis (11/91), OAB (overactive bladder), Spinal stenosis, and TMJ (sprain of temporomandibular joint).   Past Surgical History: She  has a past surgical history that includes Tonsillectomy and adenoidectomy (1946); Appendectomy (1959); Tubal ligation (1979); Cholecystectomy (1988); Hysteroscopic resection (04/1999); Breast cyst aspiration; Lithotripsy; and Cataract extraction (Right, 09/2015).   Medications: She has a  current medication list which includes the following prescription(s): vitamin c, citalopram, fosfomycin, methenamine, omeprazole, align, repatha sureclick, gemtesa, and [START ON 11/02/2022] estradiol.   Allergies: Patient is allergic to atorvastatin, codeine, pravastatin, rosuvastatin calcium, and zoster vac recomb adjuvanted.   Social History: Patient  reports that she has never smoked. She has never used smokeless tobacco. She reports that she does not drink alcohol and does not use drugs.      OBJECTIVE     Physical Exam: Vitals:   11/01/22 1015  BP: 124/76  Pulse: 89    Gen: No apparent distress, A&O x 3.  Detailed Urogynecologic Evaluation:  Deferred.    ASSESSMENT AND PLAN    Vanessa Trujillo is a 82 y.o. with:  1. Recurrent UTI      OAB - Continue Gemtesa  daily.  - Previously tried Myrbetriq, PTNS and intravesical botox. She is not interested in sacral neuromodulation.  - Discussed reducing bladder irritants as this can be contributing to symptoms of bladder spasm. Decrease tea, soda intake and increase plain water.  - She will also [facility] to [facility] for any renal stone.   2. Recurrent UTI - Currently taking methenamine BID and vitamin C - Stop fosfomycin once she has completed this month's course - She would like to change back to the yuvafem. Refill provided. Instructed to place vaginally twice a week for UTI prevention.   Return 2 months  Vanessa Beards, MD

## 2022-11-01 NOTE — Patient Instructions (Addendum)
Today we talked about ways to manage bladder urgency such as altering your diet to avoid irritative beverages and foods (bladder diet) as well as attempting to decrease stress and other exacerbating factors.  The Most Bothersome Foods* The Least Bothersome Foods*  Coffee - Regular & Decaf Tea - caffeinated Carbonated beverages - cola, non-colas, diet & caffeine-free Alcohols - Beer, Red Wine, White Wine, [address] - Grapefruit, [place], Orange, [employer] - Cranberry, Grapefruit, Orange, Pineapple Vegetables - Tomato & Tomato Products Flavor Enhancers - Hot peppers, Spicy foods, Chili, Horseradish, Vinegar, Monosodium glutamate (MSG) Artificial Sweeteners - NutraSweet, Sweet 'N Low, Equal (sweetener), Saccharin Ethnic foods - [place], [place], [place] food [employer] - low-fat & whole Fruits - Bananas, Blueberries, Honeydew melon, Pears, Raisins, Watermelon Vegetables - Broccoli, [address] Sprouts, [place], Carrots, Cauliflower, [place], Cucumber, Mushrooms, Peas, Radishes, Squash, Zucchini, White potatoes, Sweet potatoes & yams Poultry - Chicken, Eggs, [place], [employer] - Beef, Diplomatic Services operational officer, Lamb Seafood - Shrimp, Rainbow fish, Salmon Grains - Oat, Rice Snacks - Pretzels, Popcorn  *Lenward Chancellor et al. Diet and its role in interstitial cystitis/bladder pain syndrome (IC/BPS) and comorbid conditions. BJU [employer]. BJU Int. 2012 Jan 11.   Stop estrogen cream and start estrogen tablets vaginally twice a week.  Continue methenamine twice a day 3.  Finish Fosfomycin course 4.  Can take azo OTC as needed if you have bladder spasms. And follow up with [facility] for x-rays 5. Continue with Leslye Peer

## 2022-11-08 ENCOUNTER — Other Ambulatory Visit (HOSPITAL_BASED_OUTPATIENT_CLINIC_OR_DEPARTMENT_OTHER): Payer: Self-pay | Admitting: Obstetrics & Gynecology

## 2022-11-15 DIAGNOSIS — N281 Cyst of kidney, acquired: Secondary | ICD-10-CM | POA: Diagnosis not present

## 2022-11-15 DIAGNOSIS — R350 Frequency of micturition: Secondary | ICD-10-CM | POA: Diagnosis not present

## 2022-11-15 DIAGNOSIS — R3915 Urgency of urination: Secondary | ICD-10-CM | POA: Diagnosis not present

## 2022-11-15 DIAGNOSIS — R300 Dysuria: Secondary | ICD-10-CM | POA: Diagnosis not present

## 2022-11-17 ENCOUNTER — Other Ambulatory Visit: Payer: Self-pay | Admitting: Obstetrics and Gynecology

## 2022-11-17 DIAGNOSIS — N390 Urinary tract infection, site not specified: Secondary | ICD-10-CM

## 2022-11-22 NOTE — Telephone Encounter (Signed)
Discontinue medication

## 2022-12-13 DIAGNOSIS — E7800 Pure hypercholesterolemia, unspecified: Secondary | ICD-10-CM | POA: Diagnosis not present

## 2022-12-13 DIAGNOSIS — K529 Noninfective gastroenteritis and colitis, unspecified: Secondary | ICD-10-CM | POA: Diagnosis not present

## 2022-12-18 DIAGNOSIS — K529 Noninfective gastroenteritis and colitis, unspecified: Secondary | ICD-10-CM | POA: Diagnosis not present

## 2022-12-19 DIAGNOSIS — K529 Noninfective gastroenteritis and colitis, unspecified: Secondary | ICD-10-CM | POA: Diagnosis not present

## 2022-12-20 DIAGNOSIS — D0471 Carcinoma in situ of skin of right lower limb, including hip: Secondary | ICD-10-CM | POA: Diagnosis not present

## 2022-12-20 DIAGNOSIS — L821 Other seborrheic keratosis: Secondary | ICD-10-CM | POA: Diagnosis not present

## 2022-12-20 DIAGNOSIS — L814 Other melanin hyperpigmentation: Secondary | ICD-10-CM | POA: Diagnosis not present

## 2022-12-20 DIAGNOSIS — L309 Dermatitis, unspecified: Secondary | ICD-10-CM | POA: Diagnosis not present

## 2022-12-20 DIAGNOSIS — L570 Actinic keratosis: Secondary | ICD-10-CM | POA: Diagnosis not present

## 2022-12-31 NOTE — Progress Notes (Unsigned)
[facility] Return Visit  SUBJECTIVE  History of Present Illness: Vanessa Trujillo is a 82 y.o. female seen in follow-up for rUTI.  Patient reports she is constantly leaking and wearing a pad. She reports at night she is wearing depends due to her urgency and she is getting up 3 times at night.   She has taken Myrbetriq, Gemtesa, and Tolteridine Has had bladder botox x3 without success of controlling overactive bladder.   Her UTI symptoms have been under control with her current regimen.   She sometimes feels she has a "cramping" in the urethra area. She reports this happens several times a month.    She is using the yuvafem for vaginal dryness.  Past Medical History: Patient  has a past medical history of Breast cyst, Chest pain, Chronic UTI, Fibroid, GERD (gastroesophageal reflux disease), Hematuria, Hepatitis, Insomnia, Kidney stones (1999), Nonpuerperal mastitis (11/91), OAB (overactive bladder), Spinal stenosis, and TMJ (sprain of temporomandibular joint).   Past Surgical History: She  has a past surgical history that includes Tonsillectomy and adenoidectomy (1946); Appendectomy (1959); Tubal ligation (1979); Cholecystectomy (1988); Hysteroscopic resection (04/1999); Breast cyst aspiration; Lithotripsy; and Cataract extraction (Right, 09/2015).   Medications: She has a current medication list which includes the following prescription(s): NONFORMULARY OR COMPOUNDED ITEM, trospium, vitamin c, citalopram, estradiol, fosfomycin, methenamine, omeprazole, align, and repatha sureclick.   Allergies: Patient is allergic to atorvastatin, codeine, pravastatin, rosuvastatin calcium, and zoster vac recomb adjuvanted.   Social History: Patient  reports that she has never smoked. She has never used smokeless tobacco. She reports that she does not drink alcohol and does not use drugs.      OBJECTIVE     Physical Exam: There were no vitals filed for this visit. Gen: No  apparent distress, A&O x 3.  Detailed Urogynecologic Evaluation:  Deferred.    ASSESSMENT AND PLAN    Vanessa Trujillo is a 82 y.o. with:  1. Recurrent UTI   2. Overactive bladder   3. Urinary frequency   4. Vulvodynia    Recurrent UTI is well controlled with Methenamine and vaginal estrogen in the form of Yuvefem.  Patient reports her OAB symptoms are not well controlled at all. She is not sure how much she is going or how much the Leslye Peer is really helping. Will change to trospium and see if this is effective. Also encouraged patient that if she is going to switch medications she could do an evaluation of what are symptoms are like on no medications before switching to Trospium 20mg  x2 daily.  Patient given bladder diary to track her urinary symptoms.  Suspect her feelings of "grabbing" around the urethra could be related to vulvodynia type pain. Compounded cream (Amitriptyline 2.5%/ gabapentin 2.5%/ baclofen 2.5% in vaginal cream)  sent to [facility] to use PRN and see if this helps the symptoms she is experiencing.    Patient to follow up in 1 year or sooner if needed.

## 2023-01-01 ENCOUNTER — Encounter: Payer: Self-pay | Admitting: Obstetrics and Gynecology

## 2023-01-01 ENCOUNTER — Ambulatory Visit: Payer: Medicare Other | Admitting: Obstetrics and Gynecology

## 2023-01-01 DIAGNOSIS — R350 Frequency of micturition: Secondary | ICD-10-CM

## 2023-01-01 DIAGNOSIS — N94819 Vulvodynia, unspecified: Secondary | ICD-10-CM

## 2023-01-01 DIAGNOSIS — N3281 Overactive bladder: Secondary | ICD-10-CM

## 2023-01-01 DIAGNOSIS — N390 Urinary tract infection, site not specified: Secondary | ICD-10-CM

## 2023-01-01 MED ORDER — TROSPIUM CHLORIDE 20 MG PO TABS: 20.0000 mg | ORAL_TABLET | Freq: Two times a day (BID) | ORAL | 2 refills | Status: DC

## 2023-01-01 MED ORDER — NONFORMULARY OR COMPOUNDED ITEM: 5 refills | Status: AC

## 2023-01-01 NOTE — Patient Instructions (Signed)
See if the Trospium is helpful with your OAB symptoms.   Stop the [facility] for a few days and evaluate if it is helping your leakage at all. Bladder diary given.   I will send the cream to the compounding pharmacy.   We will call you in about 9 months to schedule a yearly follow up for your medications.

## 2023-01-08 DIAGNOSIS — Z1231 Encounter for screening mammogram for malignant neoplasm of breast: Secondary | ICD-10-CM | POA: Diagnosis not present

## 2023-01-31 ENCOUNTER — Ambulatory Visit: Payer: Medicare Other | Admitting: Infectious Diseases

## 2023-02-01 ENCOUNTER — Encounter (HOSPITAL_BASED_OUTPATIENT_CLINIC_OR_DEPARTMENT_OTHER): Payer: Self-pay | Admitting: *Deleted

## 2023-02-06 ENCOUNTER — Telehealth: Payer: Self-pay | Admitting: Obstetrics and Gynecology

## 2023-02-06 MED ORDER — TROSPIUM CHLORIDE ER 60 MG PO CP24: 60.0000 mg | ORAL_CAPSULE | Freq: Every day | ORAL | 2 refills | Status: DC

## 2023-02-06 NOTE — Telephone Encounter (Signed)
Spoke to patient regarding her medication changes at last visit:  The compounded cream has helped the "gripping" feeling and irritation she was having at the urethra. She feels this is well controlled and applies it as she feels the start of symptoms.   Her urinary incontinence has not been well controlled. She reports at first the Trospium 20mg  x2 daily seemed to be helpful but as time has gone on it has not been as effective. We discussed that as she has tried [place], Myrbetriq, Tolteridine, Oxybutynin and Bladder botox. Due to her age, I would not suggest another anticholinergic except trying the 60mg  ER Trospium as this does not cross the blood brain barrier.

## 2023-02-09 ENCOUNTER — Other Ambulatory Visit (HOSPITAL_BASED_OUTPATIENT_CLINIC_OR_DEPARTMENT_OTHER): Payer: Self-pay | Admitting: Obstetrics & Gynecology

## 2023-03-01 ENCOUNTER — Other Ambulatory Visit: Payer: Self-pay | Admitting: Obstetrics and Gynecology

## 2023-03-05 DIAGNOSIS — M25512 Pain in left shoulder: Secondary | ICD-10-CM | POA: Diagnosis not present

## 2023-03-13 DIAGNOSIS — K529 Noninfective gastroenteritis and colitis, unspecified: Secondary | ICD-10-CM | POA: Diagnosis not present

## 2023-03-13 DIAGNOSIS — R195 Other fecal abnormalities: Secondary | ICD-10-CM | POA: Diagnosis not present

## 2023-03-13 DIAGNOSIS — K5900 Constipation, unspecified: Secondary | ICD-10-CM | POA: Diagnosis not present

## 2023-03-15 DIAGNOSIS — R208 Other disturbances of skin sensation: Secondary | ICD-10-CM | POA: Diagnosis not present

## 2023-03-15 DIAGNOSIS — B029 Zoster without complications: Secondary | ICD-10-CM | POA: Diagnosis not present

## 2023-03-19 ENCOUNTER — Other Ambulatory Visit: Payer: Self-pay | Admitting: Obstetrics and Gynecology

## 2023-03-19 ENCOUNTER — Encounter: Payer: Self-pay | Admitting: Obstetrics and Gynecology

## 2023-03-19 DIAGNOSIS — N390 Urinary tract infection, site not specified: Secondary | ICD-10-CM

## 2023-03-23 DIAGNOSIS — M25512 Pain in left shoulder: Secondary | ICD-10-CM | POA: Diagnosis not present

## 2023-03-23 DIAGNOSIS — M792 Neuralgia and neuritis, unspecified: Secondary | ICD-10-CM | POA: Diagnosis not present

## 2023-03-24 ENCOUNTER — Other Ambulatory Visit: Payer: Self-pay | Admitting: Obstetrics and Gynecology

## 2023-03-24 DIAGNOSIS — N390 Urinary tract infection, site not specified: Secondary | ICD-10-CM

## 2023-04-15 ENCOUNTER — Encounter: Payer: Self-pay | Admitting: Obstetrics and Gynecology

## 2023-04-16 NOTE — Telephone Encounter (Signed)
Patient has been scheduled

## 2023-04-27 DIAGNOSIS — M8580 Other specified disorders of bone density and structure, unspecified site: Secondary | ICD-10-CM | POA: Diagnosis not present

## 2023-04-27 DIAGNOSIS — E7800 Pure hypercholesterolemia, unspecified: Secondary | ICD-10-CM | POA: Diagnosis not present

## 2023-04-27 DIAGNOSIS — E538 Deficiency of other specified B group vitamins: Secondary | ICD-10-CM | POA: Diagnosis not present

## 2023-04-27 DIAGNOSIS — Z0000 Encounter for general adult medical examination without abnormal findings: Secondary | ICD-10-CM | POA: Diagnosis not present

## 2023-04-27 DIAGNOSIS — B0223 Postherpetic polyneuropathy: Secondary | ICD-10-CM | POA: Diagnosis not present

## 2023-04-27 DIAGNOSIS — K219 Gastro-esophageal reflux disease without esophagitis: Secondary | ICD-10-CM | POA: Diagnosis not present

## 2023-04-27 DIAGNOSIS — I700 Atherosclerosis of aorta: Secondary | ICD-10-CM | POA: Diagnosis not present

## 2023-04-27 DIAGNOSIS — E559 Vitamin D deficiency, unspecified: Secondary | ICD-10-CM | POA: Diagnosis not present

## 2023-04-27 DIAGNOSIS — Z79899 Other long term (current) drug therapy: Secondary | ICD-10-CM | POA: Diagnosis not present

## 2023-04-30 ENCOUNTER — Ambulatory Visit: Payer: Medicare Other | Admitting: Obstetrics and Gynecology

## 2023-04-30 ENCOUNTER — Encounter: Payer: Self-pay | Admitting: Obstetrics and Gynecology

## 2023-04-30 VITALS — BP 110/69 | HR 99

## 2023-04-30 DIAGNOSIS — R350 Frequency of micturition: Secondary | ICD-10-CM

## 2023-04-30 DIAGNOSIS — N3281 Overactive bladder: Secondary | ICD-10-CM | POA: Diagnosis not present

## 2023-04-30 NOTE — Progress Notes (Signed)
[facility] Return Visit  SUBJECTIVE  History of Present Illness: BRAEDYN WEISENBACH is a 82 y.o. female seen in follow-up for OAB.  She has tried bladder Botox, PTNS, Gemtesa, Myrbetriq, trospium.  Patient reports none of these things have improved her symptoms and she is now open to considering doing SNM.  Thank you    Past Medical History: Patient  has a past medical history of Breast cyst, Chest pain, Chronic UTI, Fibroid, GERD (gastroesophageal reflux disease), Hematuria, Hepatitis, Insomnia, Kidney stones (1999), Nonpuerperal mastitis (11/91), OAB (overactive bladder), Spinal stenosis, and TMJ (sprain of temporomandibular joint).   Past Surgical History: She  has a past surgical history that includes Tonsillectomy and adenoidectomy (1946); Appendectomy (1959); Tubal ligation (1979); Cholecystectomy (1988); Hysteroscopic resection (04/1999); Breast cyst aspiration; Lithotripsy; and Cataract extraction (Right, 09/2015).   Medications: She has a current medication list which includes the following prescription(s): vitamin c, citalopram, estradiol, methenamine, NONFORMULARY OR COMPOUNDED ITEM, omeprazole, align, repatha sureclick, and trospium chloride.   Allergies: Patient is allergic to atorvastatin, codeine, pravastatin, rosuvastatin calcium, and zoster vac recomb adjuvanted.   Social History: Patient  reports that she has never smoked. She has never used smokeless tobacco. She reports that she does not drink alcohol and does not use drugs.      OBJECTIVE     Physical Exam: Vitals:   04/30/23 1441  BP: 110/69  Pulse: 99   Gen: No apparent distress, A&O x 3.  Detailed Urogynecologic Evaluation:  Deferred.    ASSESSMENT AND PLAN    Ms. Kistner is a 82 y.o. with:  1. Overactive bladder   2. Urinary frequency    At today's visit we discussed options including Axonics and [employer]tronics sacral neuromodulation devices.  After reviewing the 2 different devices she is  most interested in Axonics due to the simplicity of the remote.  We discussed that this in office trial would be a good way to see if this will work for her.  She did fill out the information card for the representative to talk to her about the device. Patient would like to move forward with PNE trial in the office for Axonics sacral neuromodulation.  Patient to follow up for in office placement

## 2023-05-01 ENCOUNTER — Encounter: Payer: Self-pay | Admitting: Obstetrics and Gynecology

## 2023-05-01 DIAGNOSIS — L218 Other seborrheic dermatitis: Secondary | ICD-10-CM | POA: Diagnosis not present

## 2023-05-01 DIAGNOSIS — L820 Inflamed seborrheic keratosis: Secondary | ICD-10-CM | POA: Diagnosis not present

## 2023-05-01 NOTE — Telephone Encounter (Signed)
Patient decided to keep her appt that was originally scheduled.

## 2023-05-14 DIAGNOSIS — K529 Noninfective gastroenteritis and colitis, unspecified: Secondary | ICD-10-CM | POA: Diagnosis not present

## 2023-05-14 DIAGNOSIS — K5900 Constipation, unspecified: Secondary | ICD-10-CM | POA: Diagnosis not present

## 2023-05-14 DIAGNOSIS — R195 Other fecal abnormalities: Secondary | ICD-10-CM | POA: Diagnosis not present

## 2023-05-16 DIAGNOSIS — K529 Noninfective gastroenteritis and colitis, unspecified: Secondary | ICD-10-CM | POA: Diagnosis not present

## 2023-05-16 DIAGNOSIS — R195 Other fecal abnormalities: Secondary | ICD-10-CM | POA: Diagnosis not present

## 2023-05-23 ENCOUNTER — Other Ambulatory Visit: Payer: Self-pay | Admitting: Nurse Practitioner

## 2023-05-23 DIAGNOSIS — R195 Other fecal abnormalities: Secondary | ICD-10-CM

## 2023-05-30 ENCOUNTER — Other Ambulatory Visit: Payer: Self-pay | Admitting: Obstetrics and Gynecology

## 2023-05-30 DIAGNOSIS — N390 Urinary tract infection, site not specified: Secondary | ICD-10-CM

## 2023-06-01 ENCOUNTER — Telehealth: Payer: Self-pay | Admitting: Obstetrics and Gynecology

## 2023-06-01 DIAGNOSIS — N390 Urinary tract infection, site not specified: Secondary | ICD-10-CM

## 2023-06-01 MED ORDER — METHENAMINE HIPPURATE 1 G PO TABS
1.0000 g | ORAL_TABLET | Freq: Two times a day (BID) | ORAL | 11 refills | Status: DC
Start: 2023-06-01 — End: 2023-08-30

## 2023-06-01 NOTE — Telephone Encounter (Signed)
Apologies. Methenamine refilled.

## 2023-06-01 NOTE — Telephone Encounter (Signed)
-----   Message from [facility] P sent at 05/31/2023  3:06 PM EST ----- Regarding: Medication Patient has been seen throughout the year by you and Kaitlin: 11/01/2022 01/01/2023 04/30/2023 Also patient is scheduled for surgery 06/14/23 Please advise. ----- Message ----- From: Marguerita Beards, MD Sent: 05/31/2023  10:52 AM EST To: Thressa Sheller, CMA  Request was denied because she has not been seen in over a year. If she makes an appt with either me or Kaitlin, then we can refill it until the appt time. Thanks. ----- Message ----- From: Thressa Sheller, CMA Sent: 05/30/2023   4:07 PM EST To: Marguerita Beards, MD  Patient called requesting methenamine (HIPREX) 1 g tablet.

## 2023-06-05 ENCOUNTER — Ambulatory Visit
Admission: RE | Admit: 2023-06-05 | Discharge: 2023-06-05 | Disposition: A | Discharge status: Home / Self Care | Payer: Medicare Other | Source: Ambulatory Visit | Attending: Nurse Practitioner | Admitting: Nurse Practitioner

## 2023-06-05 DIAGNOSIS — K5730 Diverticulosis of large intestine without perforation or abscess without bleeding: Secondary | ICD-10-CM | POA: Diagnosis not present

## 2023-06-05 DIAGNOSIS — N3289 Other specified disorders of bladder: Secondary | ICD-10-CM | POA: Diagnosis not present

## 2023-06-05 DIAGNOSIS — K449 Diaphragmatic hernia without obstruction or gangrene: Secondary | ICD-10-CM | POA: Diagnosis not present

## 2023-06-05 DIAGNOSIS — R195 Other fecal abnormalities: Secondary | ICD-10-CM

## 2023-06-05 DIAGNOSIS — I700 Atherosclerosis of aorta: Secondary | ICD-10-CM | POA: Diagnosis not present

## 2023-06-05 MED ORDER — IOPAMIDOL (ISOVUE-300) INJECTION 61%
100.0000 mL | Freq: Once | INTRAVENOUS | Status: AC | PRN
  Administered 2023-06-05: 100 mL via INTRAVENOUS

## 2023-06-08 ENCOUNTER — Other Ambulatory Visit: Payer: Self-pay | Admitting: Obstetrics and Gynecology

## 2023-06-08 ENCOUNTER — Other Ambulatory Visit (HOSPITAL_BASED_OUTPATIENT_CLINIC_OR_DEPARTMENT_OTHER): Payer: Self-pay | Admitting: Obstetrics & Gynecology

## 2023-06-08 ENCOUNTER — Encounter: Payer: Self-pay | Admitting: Obstetrics and Gynecology

## 2023-06-08 DIAGNOSIS — N390 Urinary tract infection, site not specified: Secondary | ICD-10-CM

## 2023-06-14 ENCOUNTER — Ambulatory Visit: Payer: Medicare Other | Admitting: Obstetrics and Gynecology

## 2023-06-14 ENCOUNTER — Encounter: Payer: Self-pay | Admitting: Obstetrics and Gynecology

## 2023-06-14 VITALS — BP 121/81 | HR 80

## 2023-06-14 DIAGNOSIS — N3281 Overactive bladder: Secondary | ICD-10-CM | POA: Diagnosis not present

## 2023-06-14 MED ORDER — SODIUM BICARBONATE 8.4 % IV SOLN: 7.5000 mL | Freq: Once | INTRAVENOUS | Status: DC

## 2023-06-14 MED ORDER — LIDOCAINE-EPINEPHRINE 1 %-1:100000 IJ SOLN
25.0000 mL | Freq: Once | INTRAMUSCULAR | Status: AC
  Administered 2023-06-14: 25 mL via INTRADERMAL

## 2023-06-14 NOTE — Progress Notes (Signed)
Interstim Basic Evaluation (PNE) Procedure   Procedure: Sacral Neuromodulator percutaneous sacral stimulation test using bony landmarks. Procedure repeated contralaterally for bilateral test lead placement.    Indication: 82 y.o. F with diagnosis of refractory overactive bladder. Informed consent has been obtained.    Description:  Patient was properly identified and placed in the prone position. Patient was positioned to allow the toes to dangle freely. A grounding pad was placed on the bottom of the patient's foot and the cable was connected  to the grounding pad and external stimulation box. The patient was then prepped and draped in the usual sterile fashion. 9cm was measured from the tip of the coccyx superiorly in the midline, then 2cm superiorly and 2cm laterally to mark the foramen bilaterally. Local injection of 25ml 1% lidocaine with epinephrine with 2ml of sodium bicarbonate was injected at the planned entry site bilaterally.    The foramen needle was placed at a 60 degree angle into the right S3 foramen without difficulty. The proper S3 position was confirmed by the patient's sensation to stimulation utilizing the external test stimulator. Although she had some stimulation, it was not until the stimulation was high.  This was repeated on the left side with good responses. The right side needle was removed and replaced to match the placement on the left with improved test responses. The needle stylet was removed and the percutaneous lead was inserted. Using a push and pull technique, the needle was taken out over the lead. This was then repeated on the opposite side.    The patient cables were connected to the leads and grounding pads. The patient was cleaned off and the entire region was covered with tegaderms to secure the leads. The estimated blood loss was negligible. The patient tolerated the procedure well with no complications. Using the external test stimulator, the patient was  programmed to the optimum sensation via the lead, and given instructions. Urinary diary will be kept until the return appointment. She was given post-procedure instructions and precautions to call the office with any concerns.    Return 1 week for follow up and lead removal.     Marguerita Beards, MD

## 2023-06-14 NOTE — Addendum Note (Signed)
Addended by: Marguerita Beards on: 06/14/2023 12:30 PM   Modules accepted: Orders

## 2023-06-17 ENCOUNTER — Encounter: Payer: Self-pay | Admitting: Obstetrics and Gynecology

## 2023-06-18 DIAGNOSIS — H353132 Nonexudative age-related macular degeneration, bilateral, intermediate dry stage: Secondary | ICD-10-CM | POA: Diagnosis not present

## 2023-06-18 DIAGNOSIS — H31002 Unspecified chorioretinal scars, left eye: Secondary | ICD-10-CM | POA: Diagnosis not present

## 2023-06-18 DIAGNOSIS — H40023 Open angle with borderline findings, high risk, bilateral: Secondary | ICD-10-CM | POA: Diagnosis not present

## 2023-06-18 DIAGNOSIS — H43813 Vitreous degeneration, bilateral: Secondary | ICD-10-CM | POA: Diagnosis not present

## 2023-06-20 ENCOUNTER — Telehealth: Payer: Self-pay | Admitting: Obstetrics and Gynecology

## 2023-06-20 ENCOUNTER — Encounter: Payer: Self-pay | Admitting: Obstetrics and Gynecology

## 2023-06-20 ENCOUNTER — Ambulatory Visit: Payer: Medicare Other | Admitting: Obstetrics and Gynecology

## 2023-06-20 VITALS — BP 142/82 | HR 86

## 2023-06-20 DIAGNOSIS — N3281 Overactive bladder: Secondary | ICD-10-CM

## 2023-06-20 NOTE — Telephone Encounter (Signed)
Patient called in and said she would like to schedule Botox, but to be done at the hospital under anesthesia. Did not want to be done in the office because of previous experience she had in the past.

## 2023-06-20 NOTE — Progress Notes (Signed)
[facility] Return Visit  SUBJECTIVE  History of Present Illness: Vanessa Trujillo is a 82 y.o. female seen in follow-up for OAB.  She has tried bladder Botox, PTNS, Gemtesa, Myrbetriq, trospium.  Last visit she had the axonics sacral nerve stimulation PNE device. She does not feel she had any improvement. Still has urgency and frequency along with leakage. She did feel the stimulation in the proper location and trialed both sides.   She is still using the estrogen cream twice a week.   Past Medical History: Patient  has a past medical history of Breast cyst, Chest pain, Chronic UTI, Fibroid, GERD (gastroesophageal reflux disease), Hematuria, Hepatitis, Insomnia, Kidney stones (1999), Nonpuerperal mastitis (11/91), OAB (overactive bladder), Spinal stenosis, and TMJ (sprain of temporomandibular joint).   Past Surgical History: She  has a past surgical history that includes Tonsillectomy and adenoidectomy (1946); Appendectomy (1959); Tubal ligation (1979); Cholecystectomy (1988); Hysteroscopic resection (04/1999); Breast cyst aspiration; Lithotripsy; and Cataract extraction (Right, 09/2015).   Medications: She has a current medication list which includes the following prescription(s): vitamin c, citalopram, estradiol, methenamine, NONFORMULARY OR COMPOUNDED ITEM, omeprazole, align, and repatha sureclick, and the following Facility-Administered Medications: sodium bicarbonate.   Allergies: Patient is allergic to atorvastatin, codeine, pravastatin, rosuvastatin calcium, and zoster vac recomb adjuvanted.   Social History: Patient  reports that she has never smoked. She has never used smokeless tobacco. She reports that she does not drink alcohol and does not use drugs.      OBJECTIVE     Physical Exam: Vitals:   06/20/23 1102  BP: (!) 142/82  Pulse: 86   Gen: No apparent distress, A&O x 3.  Detailed Urogynecologic Evaluation:  Deferred.    Tape removed from sacrum  and two temporary leads were easily removed, intact. Slight bleeding from the left that resolved with pressure.   ASSESSMENT AND PLAN    Vanessa Trujillo is a 82 y.o. with:  1. Overactive bladder     - Has tried myrbetriq, gemtesa, trospium, intravesical botox (at [facility]), PTNS and now SNM.  - We discussed that since failed the PNE, could consider a stage 1 procedure in the OR for a more accurate trial. We discussed that this can improve lead placement when done with fluoroscopy and could lead to better outcome. She will consider this.  - We also discussed trying botox again. She had done it 3 times and did see some improvement with the initial injection. She had a lot of pain with the last injection and had develeoped frequent UTIs, so she is hesitant to try again. We also discussed that botox can be done under sedation as well.  - She will consider her options and let us know if she wants to proceed with one of the treatment options.   Marguerita Beards, MD   Time spent: I spent 30 minutes dedicated to the care of this patient on the date of this encounter to include pre-visit review of records, face-to-face time with the patient discussing options and post visit documentation.

## 2023-06-27 ENCOUNTER — Other Ambulatory Visit: Payer: Self-pay | Admitting: Obstetrics and Gynecology

## 2023-06-27 DIAGNOSIS — R3989 Other symptoms and signs involving the genitourinary system: Secondary | ICD-10-CM

## 2023-06-27 DIAGNOSIS — N3281 Overactive bladder: Secondary | ICD-10-CM

## 2023-06-28 DIAGNOSIS — I700 Atherosclerosis of aorta: Secondary | ICD-10-CM | POA: Diagnosis not present

## 2023-06-28 DIAGNOSIS — K7689 Other specified diseases of liver: Secondary | ICD-10-CM | POA: Diagnosis not present

## 2023-06-28 DIAGNOSIS — K449 Diaphragmatic hernia without obstruction or gangrene: Secondary | ICD-10-CM | POA: Diagnosis not present

## 2023-06-28 DIAGNOSIS — K5790 Diverticulosis of intestine, part unspecified, without perforation or abscess without bleeding: Secondary | ICD-10-CM | POA: Diagnosis not present

## 2023-06-28 DIAGNOSIS — M549 Dorsalgia, unspecified: Secondary | ICD-10-CM | POA: Diagnosis not present

## 2023-06-28 DIAGNOSIS — N3281 Overactive bladder: Secondary | ICD-10-CM | POA: Diagnosis not present

## 2023-07-13 ENCOUNTER — Other Ambulatory Visit: Payer: Self-pay

## 2023-07-13 ENCOUNTER — Encounter (HOSPITAL_BASED_OUTPATIENT_CLINIC_OR_DEPARTMENT_OTHER): Payer: Self-pay | Admitting: Obstetrics and Gynecology

## 2023-07-13 NOTE — Progress Notes (Signed)
 Spoke w/ via phone for pre-op interview---Vanessa Trujillo needs dos----none per anesthesia, surgeon orders pending       Trujillo results------none COVID test -----patient states asymptomatic no test needed Arrive at -------1130 on Monday, 07/16/2023 NPO after MN NO Solid Food.  Clear liquids from MN until---1030 Med rec completed Medications to take morning of surgery -----Celexa , Famotidine Diabetic medication -----n/a Patient instructed no nail polish to be worn day of surgery Patient instructed to bring photo id and insurance card day of surgery Patient aware to have Driver (ride ) / caregiver    for 24 hours after surgery - Driver, Vanessa Trujillo, (Patient will arrange for a caregiver overnight. She is aware that she needs one.) Patient Special Instructions -----none Pre-Op special Instructions -----Requested orders from Dr. Marilynne via Epic IB on 07/13/23. Patient verbalized understanding of instructions that were given at this phone interview. Patient denies chest pain, sob, fever, cough at the interview.

## 2023-07-16 ENCOUNTER — Other Ambulatory Visit: Payer: Self-pay

## 2023-07-16 ENCOUNTER — Encounter (HOSPITAL_BASED_OUTPATIENT_CLINIC_OR_DEPARTMENT_OTHER): Payer: Self-pay | Admitting: Obstetrics and Gynecology

## 2023-07-16 ENCOUNTER — Ambulatory Visit (HOSPITAL_BASED_OUTPATIENT_CLINIC_OR_DEPARTMENT_OTHER): Payer: Medicare Other | Admitting: Anesthesiology

## 2023-07-16 ENCOUNTER — Encounter (HOSPITAL_BASED_OUTPATIENT_CLINIC_OR_DEPARTMENT_OTHER)
Admission: RE | Disposition: A | Discharge status: Home / Self Care | Payer: Self-pay | Source: Home / Self Care | Attending: Obstetrics and Gynecology

## 2023-07-16 ENCOUNTER — Ambulatory Visit (HOSPITAL_BASED_OUTPATIENT_CLINIC_OR_DEPARTMENT_OTHER)
Admission: RE | Admit: 2023-07-16 | Discharge: 2023-07-16 | Disposition: A | Discharge status: Home / Self Care | Payer: Medicare Other | Attending: Obstetrics and Gynecology | Admitting: Obstetrics and Gynecology

## 2023-07-16 DIAGNOSIS — K219 Gastro-esophageal reflux disease without esophagitis: Secondary | ICD-10-CM | POA: Insufficient documentation

## 2023-07-16 DIAGNOSIS — R399 Unspecified symptoms and signs involving the genitourinary system: Secondary | ICD-10-CM | POA: Diagnosis not present

## 2023-07-16 DIAGNOSIS — F32A Depression, unspecified: Secondary | ICD-10-CM

## 2023-07-16 DIAGNOSIS — Z01818 Encounter for other preprocedural examination: Secondary | ICD-10-CM

## 2023-07-16 DIAGNOSIS — N3281 Overactive bladder: Secondary | ICD-10-CM | POA: Diagnosis not present

## 2023-07-16 DIAGNOSIS — Z87440 Personal history of urinary (tract) infections: Secondary | ICD-10-CM | POA: Diagnosis not present

## 2023-07-16 HISTORY — DX: Other specified postprocedural states: Z98.890

## 2023-07-16 HISTORY — DX: Personal history of urinary calculi: Z87.442

## 2023-07-16 HISTORY — DX: Personal history of other diseases of the digestive system: Z87.19

## 2023-07-16 HISTORY — DX: Nausea with vomiting, unspecified: R11.2

## 2023-07-16 HISTORY — PX: CYSTOSCOPY: SHX5120

## 2023-07-16 SURGERY — CYSTOSCOPY
Anesthesia: Monitor Anesthesia Care | Site: Bladder

## 2023-07-16 MED ORDER — CEFAZOLIN SODIUM-DEXTROSE 2-4 GM/100ML-% IV SOLN
INTRAVENOUS | Status: AC
  Filled 2023-07-16: qty 100

## 2023-07-16 MED ORDER — LIDOCAINE HCL (PF) 2 % IJ SOLN
INTRAMUSCULAR | Status: AC
Start: 2023-07-16 — End: ?
  Filled 2023-07-16: qty 5

## 2023-07-16 MED ORDER — OXYCODONE HCL 5 MG/5ML PO SOLN: 5.0000 mg | Freq: Once | ORAL | Status: DC | PRN

## 2023-07-16 MED ORDER — STERILE WATER FOR IRRIGATION IR SOLN
Status: DC | PRN
  Administered 2023-07-16: 3000 mL

## 2023-07-16 MED ORDER — CEFAZOLIN SODIUM-DEXTROSE 2-4 GM/100ML-% IV SOLN
2.0000 g | INTRAVENOUS | Status: AC
Start: 2023-07-16 — End: 2023-07-16
  Administered 2023-07-16: 2 g via INTRAVENOUS

## 2023-07-16 MED ORDER — POVIDONE-IODINE 10 % EX SWAB: 2.0000 | Freq: Once | CUTANEOUS | Status: DC

## 2023-07-16 MED ORDER — PROPOFOL 500 MG/50ML IV EMUL
INTRAVENOUS | Status: AC
  Filled 2023-07-16: qty 50

## 2023-07-16 MED ORDER — LACTATED RINGERS IV SOLN: INTRAVENOUS | Status: DC

## 2023-07-16 MED ORDER — ONDANSETRON HCL 4 MG/2ML IJ SOLN
INTRAMUSCULAR | Status: DC | PRN
  Administered 2023-07-16: 4 mg via INTRAVENOUS

## 2023-07-16 MED ORDER — OXYCODONE HCL 5 MG PO TABS: 5.0000 mg | ORAL_TABLET | Freq: Once | ORAL | Status: DC | PRN

## 2023-07-16 MED ORDER — ONABOTULINUMTOXINA 100 UNITS IJ SOLR
INTRAMUSCULAR | Status: DC | PRN
  Administered 2023-07-16: 100 [IU] via INTRAMUSCULAR

## 2023-07-16 MED ORDER — LIDOCAINE HCL (CARDIAC) PF 100 MG/5ML IV SOSY
PREFILLED_SYRINGE | INTRAVENOUS | Status: DC | PRN
  Administered 2023-07-16: 40 mg via INTRAVENOUS

## 2023-07-16 MED ORDER — FENTANYL CITRATE (PF) 100 MCG/2ML IJ SOLN: 25.0000 ug | INTRAMUSCULAR | Status: DC | PRN

## 2023-07-16 MED ORDER — SODIUM CHLORIDE 0.9 % IV SOLN
INTRAVENOUS | Status: DC
  Administered 2023-07-16: 500 mL via INTRAVENOUS

## 2023-07-16 MED ORDER — SODIUM CHLORIDE (PF) 0.9 % IJ SOLN
INTRAMUSCULAR | Status: DC | PRN
  Administered 2023-07-16: 10 mL via INTRAVENOUS

## 2023-07-16 MED ORDER — FENTANYL CITRATE (PF) 100 MCG/2ML IJ SOLN
INTRAMUSCULAR | Status: AC
  Filled 2023-07-16: qty 2

## 2023-07-16 MED ORDER — SODIUM CHLORIDE 0.9 % IR SOLN
Status: DC | PRN
  Administered 2023-07-16: 3000 mL via INTRAVESICAL

## 2023-07-16 MED ORDER — ONDANSETRON HCL 4 MG/2ML IJ SOLN: 4.0000 mg | Freq: Four times a day (QID) | INTRAMUSCULAR | Status: DC | PRN

## 2023-07-16 MED ORDER — PROPOFOL 500 MG/50ML IV EMUL
INTRAVENOUS | Status: DC | PRN
  Administered 2023-07-16: 30 mg via INTRAVENOUS
  Administered 2023-07-16 (×2): 20 mg via INTRAVENOUS
  Administered 2023-07-16 (×2): 30 mg via INTRAVENOUS
  Administered 2023-07-16: 50 ug/kg/min via INTRAVENOUS
  Administered 2023-07-16: 50 mg via INTRAVENOUS

## 2023-07-16 MED ORDER — ONDANSETRON HCL 4 MG/2ML IJ SOLN
INTRAMUSCULAR | Status: AC
  Filled 2023-07-16: qty 2

## 2023-07-16 MED ORDER — FENTANYL CITRATE (PF) 100 MCG/2ML IJ SOLN
INTRAMUSCULAR | Status: DC | PRN
  Administered 2023-07-16 (×4): 25 ug via INTRAVENOUS

## 2023-07-16 SURGICAL SUPPLY — 13 items
ELECT REM PT RETURN 9FT ADLT (ELECTROSURGICAL)
ELECTRODE REM PT RTRN 9FT ADLT (ELECTROSURGICAL) IMPLANT
GLOVE BIOGEL PI IND STRL 6.5 (GLOVE) ×1 IMPLANT
GLOVE ECLIPSE 6.0 STRL STRAW (GLOVE) ×1 IMPLANT
GOWN STRL REUS W/TWL LRG LVL3 (GOWN DISPOSABLE) ×1 IMPLANT
IV NS IRRIG 3000ML ARTHROMATIC (IV SOLUTION) IMPLANT
KIT TURNOVER CYSTO (KITS) ×1 IMPLANT
MANIFOLD NEPTUNE II (INSTRUMENTS) ×1 IMPLANT
NDL SAFETY ECLIPSE 18X1.5 (NEEDLE) IMPLANT
PACK CYSTO (CUSTOM PROCEDURE TRAY) ×1 IMPLANT
SLEEVE SCD COMPRESS KNEE MED (STOCKING) ×1 IMPLANT
SYR 10ML LL (SYRINGE) IMPLANT
WATER STERILE IRR 3000ML UROMA (IV SOLUTION) IMPLANT

## 2023-07-16 NOTE — Discharge Instructions (Addendum)
 Taking Care of Yourself after Urodynamics, Cystoscopy, Bulkamid Injection, or Botox  Injection   Drink plenty of water  for a day or two following your procedure. Try to have about 8 ounces (one cup) at a time, and do this 6 times or more per day unless you have fluid restrictitons AVOID irritative beverages such as coffee, tea, soda, alcoholic or citrus drinks for a day or two, as this may cause burning with urination.  For the first 1-2 days after the procedure, your urine may be pink or red in color. You may have some blood in your urine as a normal side effect of the procedure. Large amounts of bleeding or difficulty urinating are NOT normal. Call the nurse line if this happens or go to the nearest Emergency Room if the bleeding is heavy or you cannot urinate at all and it is after hours.  You may experience some discomfort or a burning sensation with urination after having this procedure. You can use over the counter Azo or pyridium to help with burning and follow the instructions on the packaging. If it does not improve within 1-2 days, or other symptoms appear (fever, chills, or difficulty urinating) call the office to speak to a nurse.  You may return to normal daily activities such as work, school, driving, exercising and housework the day after the procedure     Post Anesthesia Home Care Instructions  Activity: Get plenty of rest for the remainder of the day. A responsible individual must stay with you for 24 hours following the procedure.  For the next 24 hours, DO NOT: -Drive a car -Advertising copywriter -Drink alcoholic beverages -Take any medication unless instructed by your physician -Make any legal decisions or sign important papers.  Meals: Start with liquid foods such as gelatin or soup. Progress to regular foods as tolerated. Avoid greasy, spicy, heavy foods. If nausea and/or vomiting occur, drink only clear liquids until the nausea and/or vomiting subsides. Call your physician if  vomiting continues.  Special Instructions/Symptoms: Your throat may feel dry or sore from the anesthesia or the breathing tube placed in your throat during surgery. If this causes discomfort, gargle with warm salt water . The discomfort should disappear within 24 hours.

## 2023-07-16 NOTE — H&P (Signed)
  [facility] Pre-Operative H&P  Subjective Chief Complaint: Vanessa Trujillo presents for a preoperative encounter.   History of Present Illness: Vanessa Trujillo is a 83 y.o. female who presents for preoperative visit.  She is scheduled to undergo cystoscopy with hydrodistention and intravesical botox  injection on 07/15/22.  Her symptoms include bladder pain and overactive bladder.    Past Medical History:  Diagnosis Date   Breast cyst    Aspirated   Chest pain    reaction to Codeine   Chronic UTI    Fibroid    GERD (gastroesophageal reflux disease)    Hematuria    Hepatitis    age 41, hospitalized   History of hiatal hernia    large per 06/05/23 CT A/P in Epic   History of kidney stones    Nonpuerperal mastitis 05/1990   OAB (overactive bladder)    Follows w/ [facility].   PONV (postoperative nausea and vomiting)    TMJ (sprain of temporomandibular joint)    around 2018     Past Surgical History:  Procedure Laterality Date   APPENDECTOMY  07/10/1957   BREAST CYST ASPIRATION     CATARACT EXTRACTION Right 09/08/2015   Dr. Camillo   CHOLECYSTECTOMY  07/10/1986   Hysteroscopic resection  04/10/1999   small fibroid polyps   LITHOTRIPSY     TONSILLECTOMY AND ADENOIDECTOMY  07/10/1944   TUBAL LIGATION  07/10/1977   BTL    is allergic to codeine, atorvastatin, pravastatin, rosuvastatin calcium, and zoster vac recomb adjuvanted.   Family History  Problem Relation Age of Onset   Breast cancer Mother 79   Heart failure Father    Emphysema Father        Died age 27   Breast cancer Maternal Grandmother 15    Social History   Tobacco Use   Smoking status: Never   Smokeless tobacco: Never  Vaping Use   Vaping status: Never Used  Substance Use Topics   Alcohol use: No   Drug use: No     Review of Systems was negative for a full 10 system review except as noted in the History of Present Illness.   Current Facility-Administered  Medications:    0.9 %  sodium chloride  infusion, , Intravenous, Continuous, Hodierne, Adam, MD, Last Rate: 40 mL/hr at 07/16/23 1201, 500 mL at 07/16/23 1201   ceFAZolin  (ANCEF ) IVPB 2g/100 mL premix, 2 g, Intravenous, On Call to OR, Marilynne Rosaline SAILOR, MD   lactated ringers  infusion, , Intravenous, Continuous, Corinne Garnette BRAVO, MD   povidone-iodine  10 % swab 2 Application, 2 Application, Topical, Once, Marilynne Rosaline SAILOR, MD   Objective Vitals:   07/16/23 1133  BP: 104/72  Pulse: 73  Resp: 17  Temp: (!) 97.1 F (36.2 C)  SpO2: 98%    Gen: NAD CV: S1 S2 RRR Lungs: Clear to auscultation bilaterally Abd: soft, nontender     Assessment/ Plan  The patient is a 83 y.o. year old scheduled to undergo cystoscopy with hydrodistention and intravesical botox  injection.    Rosaline SAILOR Marilynne, MD

## 2023-07-16 NOTE — Anesthesia Preprocedure Evaluation (Signed)
 Anesthesia Evaluation  Patient identified by MRN, date of birth, ID band Patient awake    Reviewed: Allergy & Precautions, H&P , NPO status , Patient's Chart, lab work & pertinent test results  History of Anesthesia Complications (+) PONV and history of anesthetic complications  Airway Mallampati: II   Neck ROM: full    Dental   Pulmonary neg pulmonary ROS   breath sounds clear to auscultation       Cardiovascular negative cardio ROS  Rhythm:regular Rate:Normal     Neuro/Psych  PSYCHIATRIC DISORDERS  Depression       GI/Hepatic ,GERD  ,,  Endo/Other    Renal/GU Renal diseasestones Bladder dysfunction      Musculoskeletal   Abdominal   Peds  Hematology   Anesthesia Other Findings   Reproductive/Obstetrics                             Anesthesia Physical Anesthesia Plan  ASA: 3  Anesthesia Plan: MAC   Post-op Pain Management:    Induction: Intravenous  PONV Risk Score and Plan: 3 and Propofol  infusion and Treatment may vary due to age or medical condition  Airway Management Planned: Simple Face Mask  Additional Equipment:   Intra-op Plan:   Post-operative Plan:   Informed Consent: I have reviewed the patients History and Physical, chart, labs and discussed the procedure including the risks, benefits and alternatives for the proposed anesthesia with the patient or authorized representative who has indicated his/her understanding and acceptance.     Dental advisory given  Plan Discussed with: CRNA, Anesthesiologist and Surgeon  Anesthesia Plan Comments:        Anesthesia Quick Evaluation

## 2023-07-16 NOTE — Transfer of Care (Signed)
 Immediate Anesthesia Transfer of Care Note  Patient: Vanessa Trujillo  Procedure(s) Performed: Procedure(s) (LRB): CYSTOSCOPY with hydrodistention and intravesical botox  injection (100 units) (N/A)  Patient Location: PACU  Anesthesia Type: General  Level of Consciousness: awake, alert  and oriented  Airway & Oxygen Therapy: Patient Spontanous Breathing and Patient connected to nasal cannula oxygen  Post-op Assessment: Report given to PACU RN and Post -op Vital signs reviewed and stable  Post vital signs: Reviewed and stable  Complications: No apparent anesthesia complications  Last Vitals:  Vitals Value Taken Time  BP 128/59 07/16/23 1412  Temp    Pulse 71 07/16/23 1414  Resp 17 07/16/23 1414  SpO2 98 % 07/16/23 1414  Vitals shown include unfiled device data.  Last Pain:  Vitals:   07/16/23 1148  TempSrc:   PainSc: 0-No pain      Patients Stated Pain Goal: 4 (07/16/23 1148)  Complications: No notable events documented.

## 2023-07-16 NOTE — Anesthesia Procedure Notes (Signed)
 Procedure Name: MAC Date/Time: 07/16/2023 1:19 PM  Performed by: Dena Grayce MATSU, CRNAPre-anesthesia Checklist: Patient identified, Emergency Drugs available, Suction available, Patient being monitored and Timeout performed Oxygen Delivery Method: Nasal cannula Placement Confirmation: breath sounds checked- equal and bilateral, positive ETCO2 and CO2 detector

## 2023-07-16 NOTE — Op Note (Signed)
 Operative Note  Preoperative Diagnosis: overactive bladder and bladder pain  Postoperative Diagnosis: same  Procedures performed:  Cystoscopy with hydrodistention and intravesical botox  injection (100 units)  Implants: none  Attending Surgeon: Rosaline Caper, MD  Anesthesia: MAC  Findings: On cystoscopy, normal bladder mucosa, trabeculations present throughout . After hydrodistention, petechiae noted. No hunner ulcerations present   Specimens: none  Estimated blood loss: 1 mL  IV fluids: 500 mL  Urine output: not obtained  Complications: none  Procedure in Detail:  After informed consent was obtained the patient was taken to the operating room where MAC anesthesia was induced and found to be adequate.  She was placed in dorsolithotomy position taking care to avoid any nerve injury and prepped and draped in the usual sterile fashion.  Cystoscopy was performed with a 70 degree scope and sterile water .  The water  was placed at 80 cm above the patient and allowing to drain freely. A 360 degree view of the bladder was obtained which showed normal bladder and urethral mucosa and severe trabeculations were present throughout the bladder.  Bilateral ureteral efflux was present.   Once the bladder was full and the water  stopped running,  the water  was held in the bladder for 3 minutes.  The bladder was then drained and capacity was measured at 150 mL. The cystoscope was then placed again and petechial hemorrhage was noted throughout the bladder.  There was no sign of any Hunner ulcerations. The bladder was filled a second time and held for 3 minutes. The bladder was drained and the cystoscope was removed. An injection cystoscope was inserted. A total of 10 ml / 100 units of Botox  A was injected in the detrusor muscle via 5 injections of 2ml each. These were spaced about 1 cm apart. Care was taken to avoid the ureteral orifices and the trigone. Bleeding was noted at the injection sites which  stopped on its own. The cystoscope was removed. Patient tolerated the procedure well and was taken to the recovery room in stable condition.    [name], MD

## 2023-07-17 ENCOUNTER — Telehealth: Payer: Self-pay | Admitting: Obstetrics and Gynecology

## 2023-07-17 ENCOUNTER — Encounter: Payer: Self-pay | Admitting: Obstetrics and Gynecology

## 2023-07-17 NOTE — Telephone Encounter (Signed)
 Vanessa Trujillo underwent Cystoscopy with hydrodistention and intravesical botox  injection on 07/16/23.   She voided prior to discharge and was discharged without a catheter. Please call her for a routine post op check. Thanks!  [name], MD

## 2023-07-17 NOTE — Telephone Encounter (Signed)
 Post- Op Call  Vanessa Trujillo underwent CYSTOSCOPY with hydrodistention and intravesical botox  injection (100 units) (Bladder) on 07/16/23 with Dr Marilynne. The patient reports that her pain is controlled. She is taking Nothing for BM. She denies vaginal bleeding. She has not had a bowel movement and is taking Miralax  for a bowel regimen. She was discharged without a catheter and will return for a post op.  Vanessa Trujillo, CMA

## 2023-07-20 NOTE — Anesthesia Postprocedure Evaluation (Signed)
 Anesthesia Post Note  Patient: Lillian SAUNDERS Leinen  Procedure(s) Performed: CYSTOSCOPY with hydrodistention and intravesical botox  injection (100 units) (Bladder)     Patient location during evaluation: PACU Anesthesia Type: MAC Level of consciousness: awake and alert Pain management: pain level controlled Vital Signs Assessment: post-procedure vital signs reviewed and stable Respiratory status: spontaneous breathing, nonlabored ventilation, respiratory function stable and patient connected to nasal cannula oxygen Cardiovascular status: stable and blood pressure returned to baseline Postop Assessment: no apparent nausea or vomiting Anesthetic complications: no   No notable events documented.  Last Vitals:  Vitals:   07/16/23 1445 07/16/23 1511  BP: (!) 141/67 (!) 144/68  Pulse: 66 69  Resp: 13 15  Temp:  (!) 36.1 C  SpO2: 93% 98%    Last Pain:  Vitals:   07/16/23 1511  TempSrc:   PainSc: 0-No pain                 Jordy Verba S

## 2023-08-09 ENCOUNTER — Other Ambulatory Visit (HOSPITAL_BASED_OUTPATIENT_CLINIC_OR_DEPARTMENT_OTHER): Payer: Self-pay

## 2023-08-10 ENCOUNTER — Ambulatory Visit (HOSPITAL_BASED_OUTPATIENT_CLINIC_OR_DEPARTMENT_OTHER): Payer: Medicare Other | Admitting: Orthopaedic Surgery

## 2023-08-10 ENCOUNTER — Other Ambulatory Visit (HOSPITAL_BASED_OUTPATIENT_CLINIC_OR_DEPARTMENT_OTHER): Payer: Self-pay

## 2023-08-10 ENCOUNTER — Ambulatory Visit (HOSPITAL_BASED_OUTPATIENT_CLINIC_OR_DEPARTMENT_OTHER): Payer: Medicare Other

## 2023-08-10 DIAGNOSIS — G8929 Other chronic pain: Secondary | ICD-10-CM

## 2023-08-10 DIAGNOSIS — M47816 Spondylosis without myelopathy or radiculopathy, lumbar region: Secondary | ICD-10-CM | POA: Diagnosis not present

## 2023-08-10 DIAGNOSIS — M533 Sacrococcygeal disorders, not elsewhere classified: Secondary | ICD-10-CM | POA: Diagnosis not present

## 2023-08-10 DIAGNOSIS — M5126 Other intervertebral disc displacement, lumbar region: Secondary | ICD-10-CM | POA: Diagnosis not present

## 2023-08-10 MED ORDER — ESTRADIOL 10 MCG VA TABS: 1.0000 | ORAL_TABLET | VAGINAL | 7 refills | Status: DC

## 2023-08-10 MED ORDER — METHENAMINE MANDELATE 1 G PO TABS
1.0000 g | ORAL_TABLET | Freq: Two times a day (BID) | ORAL | 11 refills | Status: DC
  Filled 2023-08-27 – 2023-09-24 (×3): qty 60, 30d supply, fill #0

## 2023-08-10 MED ORDER — OMEPRAZOLE 10 MG PO CPDR
10.0000 mg | DELAYED_RELEASE_CAPSULE | Freq: Every day | ORAL | 0 refills | Status: DC
  Filled 2023-11-17: qty 100, 100d supply, fill #0

## 2023-08-10 MED ORDER — KETOCONAZOLE 2 % EX SHAM: MEDICATED_SHAMPOO | CUTANEOUS | 11 refills | Status: DC

## 2023-08-10 MED ORDER — OMEPRAZOLE 10 MG PO CPDR
10.0000 mg | DELAYED_RELEASE_CAPSULE | Freq: Every day | ORAL | 4 refills | Status: DC
  Filled 2023-10-20: qty 90, 90d supply, fill #0
  Filled 2024-01-25: qty 90, 90d supply, fill #1

## 2023-08-10 MED ORDER — REPATHA SURECLICK 140 MG/ML ~~LOC~~ SOAJ
140.0000 mg | SUBCUTANEOUS | 1 refills | Status: DC
  Filled 2023-08-27: qty 2, 28d supply, fill #0
  Filled 2023-09-19 – 2023-09-22 (×3): qty 2, 28d supply, fill #1

## 2023-08-10 MED ORDER — FLUOCINONIDE 0.05 % EX SOLN: 1.0000 | Freq: Every evening | CUTANEOUS | 11 refills | Status: DC

## 2023-08-10 NOTE — Progress Notes (Signed)
Chief Complaint: Lower back pain, left scapular pain     History of Present Illness:    Vanessa Trujillo is a 83 y.o. female presents with issues with her left posterior shoulder as well as the left lower back.  She states that she is concerned of a history of lumbar stenosis as her mother suffered this.  She does have pain that radiates down the posterior aspect of both buttocks as well as into both SI joints.  With regard to the left shoulder she is experiencing pain at the inferior angle of the left scapula.  She states this feels like internal shingles although her testing for this was negative.  She has been trialing naproxen with some relief.  She has not had any physical therapy    PMH/PSH/Family History/Social History/Meds/Allergies:    Past Medical History:  Diagnosis Date   Breast cyst    Aspirated   Chest pain    reaction to Codeine   Chronic UTI    Fibroid    GERD (gastroesophageal reflux disease)    Hematuria    Hepatitis    age 71, hospitalized   History of hiatal hernia    large per 06/05/23 CT A/P in Epic   History of kidney stones    Nonpuerperal mastitis 05/1990   OAB (overactive bladder)    Follows w/ [facility].   PONV (postoperative nausea and vomiting)    TMJ (sprain of temporomandibular joint)    around 2018   Past Surgical History:  Procedure Laterality Date   APPENDECTOMY  07/10/1957   BREAST CYST ASPIRATION     CATARACT EXTRACTION Right 09/08/2015   Dr. Hazle Quant   CHOLECYSTECTOMY  07/10/1986   CYSTOSCOPY N/A 07/16/2023   Procedure: CYSTOSCOPY with hydrodistention and intravesical botox injection (100 units);  Surgeon: Marguerita Beards, MD;  Location: [facility];  Service: Gynecology;  Laterality: N/A;   Hysteroscopic resection  04/10/1999   small fibroid polyps   LITHOTRIPSY     TONSILLECTOMY AND ADENOIDECTOMY  07/10/1944   TUBAL LIGATION  07/10/1977   BTL   Social History   Socioeconomic History   Marital  status: Widowed    Spouse name: Not on file   Number of children: Not on file   Years of education: Not on file   Highest education level: Not on file  Occupational History   Occupation: Retired.  Tobacco Use   Smoking status: Never   Smokeless tobacco: Never  Vaping Use   Vaping status: Never Used  Substance and Sexual Activity   Alcohol use: No   Drug use: No   Sexual activity: Not Currently    Partners: Male    Birth control/protection: Post-menopausal  Other Topics Concern   Not on file  Social History Narrative   Pt widowed since 2012, lives alone.   Social Drivers of Corporate investment banker Strain: Not on file  Food Insecurity: Not on file  Transportation Needs: Not on file  Physical Activity: Not on file  Stress: Not on file  Social Connections: Not on file   Family History  Problem Relation Age of Onset   Breast cancer Mother 27   Heart failure Father    Emphysema Father        Died age 79   Breast cancer Maternal Grandmother 46   Allergies  Allergen Reactions   Codeine     Chest pain   Atorvastatin     Other reaction(s): muscle  Pravastatin     Other reaction(s): muscle pain   Rosuvastatin Calcium     Other reaction(s): muscle pain   Zoster Vac Recomb Adjuvanted Other (See Comments)    Shingles vaccine - very painful muscles and joints aches   Current Outpatient Medications  Medication Sig Dispense Refill   Ascorbic Acid (VITAMIN C) 1000 MG tablet Take 1 tablet (1,000 mg total) by mouth in the morning, at noon, in the evening, and at bedtime. (Patient taking differently: Take 1,000 mg by mouth in the morning and at bedtime.) 120 tablet 2   citalopram (CELEXA) 20 MG tablet TAKE 1 TABLET BY MOUTH EVERY MORNING 30 tablet 1   Estradiol (YUVAFEM) 10 MCG TABS vaginal tablet Place 1 tablet (10 mcg total) vaginally 2 (two) times a week as directed 8 tablet 7   Estradiol 10 MCG TABS vaginal tablet PLACE 1 TABLET VAGINALLY 2 TIMES A WEEK. 24 tablet 3    Evolocumab (REPATHA SURECLICK) 140 MG/ML SOAJ Inject 140 mg into the skin every 14 (fourteen) days. 6 mL 1   famotidine (PEPCID) 40 MG tablet Take 40 mg by mouth daily.     fluocinonide (LIDEX) 0.05 % external solution Apply a small amount to affected areas on scalp nightly as needed for itching 60 mL 11   ketoconazole (NIZORAL) 2 % shampoo Apply topically as directed 120 mL 11   methenamine (HIPREX) 1 g tablet Take 1 tablet (1 g total) by mouth 2 (two) times daily with a meal. 60 tablet 11   methenamine (MANDELAMINE) 1 g tablet Take 1 tablet (1 g total) by mouth 2 (two) times daily with a meal. 60 tablet 11   Multiple Vitamins-Minerals (PRESERVISION AREDS PO) Take by mouth.     NONFORMULARY OR COMPOUNDED ITEM Amitriptyline 2.5%/ gabapentin 2.5%/ baclofen 2.5% in vaginal cream.  Place 1g daily in vaginal/ vulvar area.  Dispense 60g with 5 refills. 60 each 5   omeprazole (PRILOSEC) 10 MG capsule Take 1 capsule (10 mg total) by mouth daily. 360 capsule 0   omeprazole (PRILOSEC) 10 MG capsule Take 1 capsule (10 mg total) by mouth daily. 90 capsule 4   Probiotic Product (ALIGN) 4 MG CAPS      REPATHA SURECLICK 140 MG/ML SOAJ Inject 1 mL into the skin every 14 (fourteen) days.     vitamin B-12 (CYANOCOBALAMIN) 100 MCG tablet Take 100 mcg by mouth daily.     No current facility-administered medications for this visit.   No results found.  Review of Systems:   A ROS was performed including pertinent positives and negatives as documented in the HPI.  Physical Exam :   Constitutional: NAD and appears stated age Neurological: Alert and oriented Psych: Appropriate affect and cooperative Last menstrual period 07/11/1999.   Comprehensive Musculoskeletal Exam:    Left shoulder with evidence of dyskinesis and forward elevation with positive pain about the inferior scapular bursa.  Positive bilateral pain about the SI joints.   Imaging:   Xray (lumbar spine 4 views): Bilateral SI  arthritis     I personally reviewed and interpreted the radiographs.   Assessment and Plan:   83 y.o. female with evidence of left shoulder scapular dyskinesis which I would like her to work on with physical therapy.  She does also have bilateral SI symptoms and to that effect I will plan for referral to Dr. Shon Baton for bilateral SI injections.  I would like her to work in physical therapy for pelvic floor type exercises for the SI  joints as well as for the left scapular dyskinesis.  I will plan to see her back in 3 months for reassessment  -Return to clinic 3 months for reassessment   I personally saw and evaluated the patient, and participated in the management and treatment plan.  Huel Cote, MD Attending Physician, Orthopedic Surgery  This document was dictated using Dragon voice recognition software. A reasonable attempt at proof reading has been made to minimize errors.

## 2023-08-11 ENCOUNTER — Other Ambulatory Visit (HOSPITAL_BASED_OUTPATIENT_CLINIC_OR_DEPARTMENT_OTHER): Payer: Self-pay | Admitting: Obstetrics & Gynecology

## 2023-08-13 ENCOUNTER — Encounter (HOSPITAL_BASED_OUTPATIENT_CLINIC_OR_DEPARTMENT_OTHER): Payer: Self-pay | Admitting: Orthopaedic Surgery

## 2023-08-13 ENCOUNTER — Ambulatory Visit (HOSPITAL_BASED_OUTPATIENT_CLINIC_OR_DEPARTMENT_OTHER): Payer: Medicare Other | Attending: Nurse Practitioner | Admitting: Physical Therapy

## 2023-08-13 ENCOUNTER — Other Ambulatory Visit: Payer: Self-pay

## 2023-08-13 ENCOUNTER — Encounter (HOSPITAL_BASED_OUTPATIENT_CLINIC_OR_DEPARTMENT_OTHER): Payer: Self-pay | Admitting: Physical Therapy

## 2023-08-13 DIAGNOSIS — G8929 Other chronic pain: Secondary | ICD-10-CM | POA: Insufficient documentation

## 2023-08-13 DIAGNOSIS — M25511 Pain in right shoulder: Secondary | ICD-10-CM | POA: Diagnosis not present

## 2023-08-13 DIAGNOSIS — M25512 Pain in left shoulder: Secondary | ICD-10-CM | POA: Diagnosis not present

## 2023-08-13 DIAGNOSIS — R293 Abnormal posture: Secondary | ICD-10-CM | POA: Insufficient documentation

## 2023-08-13 DIAGNOSIS — M533 Sacrococcygeal disorders, not elsewhere classified: Secondary | ICD-10-CM | POA: Insufficient documentation

## 2023-08-13 DIAGNOSIS — M5459 Other low back pain: Secondary | ICD-10-CM | POA: Insufficient documentation

## 2023-08-13 NOTE — Therapy (Signed)
OUTPATIENT PHYSICAL THERAPY EVALUATION   Patient Name: Vanessa Trujillo MRN: [id number] DOB:[birth year], 83 y.o., female Today's Date: 08/13/2023  END OF SESSION:  PT End of Session - 08/13/23 1103     Visit Number 1    Number of Visits 17    Date for PT Re-Evaluation 10/08/23    Authorization Type [employer] MCR    Progress Note Due on Visit 10    PT Start Time 1100    PT Stop Time 1149    PT Time Calculation (min) 49 min    Activity Tolerance Patient tolerated treatment well    Behavior During Therapy WFL for tasks assessed/performed             Past Medical History:  Diagnosis Date   Breast cyst    Aspirated   Chest pain    reaction to Codeine   Chronic UTI    Fibroid    GERD (gastroesophageal reflux disease)    Hematuria    Hepatitis    age 54, hospitalized   History of hiatal hernia    large per 06/05/23 CT A/P in Epic   History of kidney stones    Nonpuerperal mastitis 05/1990   OAB (overactive bladder)    Follows w/ [facility].   PONV (postoperative nausea and vomiting)    TMJ (sprain of temporomandibular joint)    around 2018   Past Surgical History:  Procedure Laterality Date   APPENDECTOMY  07/10/1957   BREAST CYST ASPIRATION     CATARACT EXTRACTION Right 09/08/2015   Dr. Hazle Quant   CHOLECYSTECTOMY  07/10/1986   CYSTOSCOPY N/A 07/16/2023   Procedure: CYSTOSCOPY with hydrodistention and intravesical botox injection (100 units);  Surgeon: Marguerita Beards, MD;  Location: [facility];  Service: Gynecology;  Laterality: N/A;   Hysteroscopic resection  04/10/1999   small fibroid polyps   LITHOTRIPSY     TONSILLECTOMY AND ADENOIDECTOMY  07/10/1944   TUBAL LIGATION  07/10/1977   BTL   Patient Active Problem List   Diagnosis Date Noted   Medication monitoring encounter 08/09/2022   Overactive bladder 10/18/2021   Recurrent UTI 10/18/2021   Depression 06/17/2016   Gastroesophageal reflux disease without esophagitis    Chest pain at  rest 06/16/2016   Cough 06/16/2016   Hyperglycemia 06/16/2016   Calculus of kidney 04/16/2013   Urge incontinence 04/16/2013   Urinary urgency 04/16/2013   Infection of urinary tract 04/16/2013    REFERRING PROVIDER:  Huel Cote, MD   REFERRING DIAG:  M53.3,G89.29 (ICD-10-CM) - Chronic left SI joint pain    Pelvic floor /SI joint Scapular dyskinesis program  Rationale for Evaluation and Treatment: Rehabilitation  THERAPY DIAG:  Other low back pain  Chronic left shoulder pain  Chronic right shoulder pain  Abnormal posture  ONSET DATE: 3-4 wk back/SIJ; 6-7 mo for shoulder   SUBJECTIVE:  SUBJECTIVE STATEMENT: SIJ pain probably 3-4 wk ago. When I am sleeping, my legs/knees hurt so bad and wakes me up. Was taking a seated yoga class and felt that exacerbated it.  Bucket list trip end of March for 19 days.  Shoulder pain on Left in last 6-7 months.   PERTINENT HISTORY:  See PMH  PAIN:  Are you having pain? Yes: NPRS scale: not really pain Pain location: SIJ Pain description: its just there Aggravating factors: constant Relieving factors: move around  PRECAUTIONS:  None  RED FLAGS: None   WEIGHT BEARING RESTRICTIONS:  No  FALLS:  Has patient fallen in last 6 months? No   OCCUPATION:  retired  PLOF:  Independent  PATIENT GOALS:  Decrease pain, get ready for trip   OBJECTIVE:  Note: Objective measures were completed at Evaluation unless otherwise noted.  DIAGNOSTIC FINDINGS:  Mild lumbar levoscoliosis seen in xray taken 08/10/23  PATIENT SURVEYS:  Modified Oswestry 15 (did not answer #8)  Quick Dash 45  COGNITIVE STATUS: Within functional limits for tasks assessed   SENSATION: WFL    POSTURE:  EVAL:   HAND DOMINANCE:  Right   Body Part #1  Lumbar   LOWER EXTREMITY MMT:    MMT (lb) Right eval Left eval  Hip flexion    Hip extension    Hip abduction (SL at knee) 15.9 6.4  Hip adduction    Hip internal rotation    Hip external rotation    Knee flexion 12.0 10.2  Knee extension 26.3 11.0   (Blank rows = not tested)                                                                                                                               TREATMENT DATE:   Treatment                            2/3:  Supine rib flare reduction with transverse set Lt sidelying oblique/Rt abd engagement Seated posture with ab set Standing posture- Rt foot back+glut set    PATIENT EDUCATION:  Education details: Anatomy of condition, POC, HEP, exercise form/rationale Person educated: Patient Education method: Explanation, Demonstration, Tactile cues, Verbal cues, and Handouts Education comprehension: verbalized understanding, returned demonstration, verbal cues required, tactile cues required, and needs further education  HOME EXERCISE PROGRAM:  [email or web address]  Access Code: [email or web address]   ASSESSMENT:  CLINICAL IMPRESSION: Patient is a 83 y.o. F who was seen today for physical therapy evaluation and treatment for SIJ pain and scapular dyskinesis. Xrays show lumbar levoscoliosis and visual completion of S curve through thoracic spine. Focus today on engagement of central stability and reducing Lt lower rib cage flare to improve lumbar stability and allow mobilization of Lt anterior/superior rib cage for Lt shoulder mobility without impingement. Pt has a big trip coming up in march and would like to be prepared for that.  Significant weakness  notable along biomechanical chain. As she is fearful of hurting herself, she will focus on walking for cardio/endurance and exercises given today for NM re-ed.      REHAB POTENTIAL: Good  CLINICAL DECISION MAKING: Evolving/moderate complexity  EVALUATION COMPLEXITY:  Low   GOALS: Goals reviewed with patient? Yes  SHORT TERM GOALS: Target date: 2/24  Able to demo controlled rib cage flare reduction in supine, sidelying and quadruped Baseline: Goal status: INITIAL  2.  Gross strength to increase by 10% Baseline:  Goal status: INITIAL  3.  Able to complete exercises without  increase in concordant pain Baseline:  Goal status: INITIAL   LONG TERM GOALS: Target date:  POC date  Functional outcome scores to improve by MDC Baseline:  Goal status: INITIAL  2.  Rt to Lt strength measures 85% of opposite side Baseline:  Goal status: INITIAL  3.  Able to stand from chairs without use of UEs Baseline:  Goal status: INITIAL  4.  Pt will verbalize preparedness for her trip with understanding of HEP to continue Baseline:  Goal status: INITIAL    PLAN:  PT FREQUENCY: 1-2x/week  PT DURATION: 8 weeks  PLANNED INTERVENTIONS: 97164- PT Re-evaluation, 97110-Therapeutic exercises, 97530- Therapeutic activity, 97112- Neuromuscular re-education, 97535- Self Care, 91478- Manual therapy, [phone]- Gait training, [phone]- Aquatic Therapy, Patient/Family education, Balance training, Stair training, Taping, Dry Needling, Joint mobilization, Spinal mobilization, Cryotherapy, and Moist heat.  PLAN FOR NEXT SESSION: continue core stability for Lt obliques/Rt abductors; qped core; gross quad/HS/gastroc strength with postural cues  Elie Gragert C. Torrence Hammack PT, DPT 08/13/23 12:45 PM  Date of referral: 08/10/23 REFERRING PROVIDER:  Huel Cote, MD   REFERRING DIAG:  [phone] (ICD-10-CM) - Chronic left SI joint pain   Treatment diagnosis? (if different than referring diagnosis) M54.59, M25.512, M25.511, R29.3  What was this (referring dx) caused by? Ongoing Issue  Ashby Dawes of Condition: Chronic (continuous duration > 3 months)   Laterality: Both  Current Functional Measure Score: Modified Oswestry 15 (did not answer #8)  Quick Dash 45  Objective  measurements identify impairments when they are compared to normal values, the uninvolved extremity, and prior level of function.  [x]  Yes  []  No  Objective assessment of functional ability: Moderate functional limitations   SIJ pain probably 3-4 wk ago. When I am sleeping, my legs/knees hurt so bad and wakes me up. Was taking a seated yoga class and felt that exacerbated it.  Bucket list trip end of March for 19 days.  Shoulder pain on Left in last 6-7 months.   Average pain intensity:  Last 24 hours: mod-severe  Past week: mod-severe  How often does the pt experience symptoms? Frequently  How much have the symptoms interfered with usual daily activities? Quite a bit  How has condition changed since care began at this facility? NA - initial visit  In general, how is the patients overall health? Very Good   BACK PAIN (STarT Back Screening Tool) No

## 2023-08-14 ENCOUNTER — Encounter: Payer: Self-pay | Admitting: Sports Medicine

## 2023-08-14 ENCOUNTER — Encounter: Payer: Medicare Other | Admitting: Obstetrics and Gynecology

## 2023-08-14 ENCOUNTER — Other Ambulatory Visit: Payer: Self-pay

## 2023-08-14 ENCOUNTER — Ambulatory Visit: Payer: Medicare Other | Admitting: Sports Medicine

## 2023-08-14 DIAGNOSIS — M533 Sacrococcygeal disorders, not elsewhere classified: Secondary | ICD-10-CM

## 2023-08-14 DIAGNOSIS — G8929 Other chronic pain: Secondary | ICD-10-CM | POA: Diagnosis not present

## 2023-08-14 DIAGNOSIS — M5136 Other intervertebral disc degeneration, lumbar region with discogenic back pain only: Secondary | ICD-10-CM | POA: Diagnosis not present

## 2023-08-14 DIAGNOSIS — M7918 Myalgia, other site: Secondary | ICD-10-CM

## 2023-08-14 NOTE — Progress Notes (Signed)
 Vanessa Trujillo - 83 y.o. female MRN [id number]  Date of birth: June 03, 1941  Office Visit Note: Visit Date: 08/14/2023 PCP: Vanessa Trula SQUIBB, MD Referred by: Vanessa Standing, MD  Subjective: Chief Complaint  Patient presents with   Lower Back - Pain   HPI: Vanessa Trujillo is a pleasant 83 y.o. female who presents today for chronic low back pain that has progressed, bilateral.  She has pain down the low back that radiates down her buttocks bilaterally.  This never goes down past the knees.  Is more of a sharp radiating pain, denies any numbness or tingling.  She has seen my partner, Dr. Genelle who sees her for the shoulder as well.  She did get started in formalized physical therapy, yesterday was her last session.  She is taking naproxen for pain control.  Pertinent ROS were reviewed with the patient and found to be negative unless otherwise specified above in HPI.   Assessment & Plan: Visit Diagnoses:  1. Bilateral buttock pain   2. Chronic right SI joint pain   3. Chronic left SI joint pain   4. Degeneration of intervertebral disc of lumbar region with discogenic back pain    Plan: Discussed with Vanessa Trujillo the nature of her chronic low back pain which does not emanate around the SI joint complex.  She does have some degenerative disc disease of the lumbar listhesis.  Most of her pain seems to be emanating from the SI joints bilaterally with some arthritis on x-ray.  Through shared decision-making, we did proceed with bilateral SI joint injections under ultrasound guidance.  Advised on 48 hours of modified rest/activity.  She may use ice/heat for for naproxen as needed for pain control.  I would like her to continue to progress to formalized physical therapy.  She will follow-up with Dr. Genelle for her scheduled appointment, I am happy to see her back as needed.  Did discuss if this is not largely improved, may make sense to further evaluate the lumbar spine itself, consider advanced  imaging.  Follow-up: Return for f/u with Dr. Genelle.   Meds & Orders: No orders of the defined types were placed in this encounter.   Orders Placed This Encounter  Procedures   US  Guided Needle Placement - No Linked Charges     Procedures: U/S-guided SI-joint injection, Right   After discussion of risk/benefits/indications, informed verbal consent was obtained. A timeout was then performed. The patient was positioned in a prone position on exam room table with a pillow placed under the pelvis for mild hip flexion. The SI joint area was cleaned and prepped with betadine  and alcohol swabs. Sterile ultrasound gel was applied and the ultrasound transducer was placed in an anatomic axial plane over the PSIS, then moved distally over the SI-joint. Using ultrasound guidance, a 22-gauge, 3.5 needle was inserted from a medial to lateral approach utilizing an in-plane approach and directed into the SI-joint. The SI-joint was then injected with a mixture of 4:1 lidocaine :depomedrol with visualization of the injectate flow into the SI-joint under ultrasound visualization. The patient tolerated the procedure well without immediate complications.   U/S-guided SI-joint injection, Left   After discussion of risk/benefits/indications, informed verbal consent was obtained. A timeout was then performed. The patient was positioned in a prone position on exam room table with a pillow placed under the pelvis for mild hip flexion. The SI joint area was cleaned and prepped with betadine  and alcohol swabs. Sterile ultrasound gel was applied and the  ultrasound transducer was placed in an anatomic axial plane over the PSIS, then moved distally over the SI-joint. Using ultrasound guidance, a 22-gauge, 3.5 needle was inserted from a medial to lateral approach utilizing an in-plane approach and directed into the SI-joint. The SI-joint was then injected with a mixture of 4:1 lidocaine :depomedrol with visualization of the  injectate flow into the SI-joint under ultrasound visualization. The patient tolerated the procedure well without immediate complications.       Clinical History: No specialty comments available.  She reports that she has never smoked. She has never used smokeless tobacco. No results for input(s): HGBA1C, LABURIC in the last 8760 hours.  Objective:   Vital Signs: LMP 07/11/1999   Physical Exam  Gen: Well-appearing, in no acute distress; non-toxic CV: Well-perfused. Warm.  Resp: Breathing unlabored on room air; no wheezing. Psych: Fluid speech in conversation; appropriate affect; normal thought process  Ortho Exam - Low back/SI joints: + TTP left greater than right SI joint region just inferior to the PSIS.  Positive SI joint compression test bilaterally, positive Gaenslen's on the left side.  Equivocal leg length.  Imaging:  *Independent review of complete lumbar spine x-ray from 08/10/2023 was performed by myself today.  There is lumbar DDD most significant at the L5-S1 level.  There is a small stepwise anterior listhesis of L3 on L4 and L4 on L5.  There is a mild degenerative scoliotic core of the lumbar spine.  There is SI joint arthritis noted bilaterally.  Past Medical/Family/Surgical/Social History: Medications & Allergies reviewed per EMR, new medications updated. Patient Active Problem List   Diagnosis Date Noted   Medication monitoring encounter 08/09/2022   Overactive bladder 10/18/2021   Recurrent UTI 10/18/2021   Depression 06/17/2016   Gastroesophageal reflux disease without esophagitis    Chest pain at rest 06/16/2016   Cough 06/16/2016   Hyperglycemia 06/16/2016   Calculus of kidney 04/16/2013   Urge incontinence 04/16/2013   Urinary urgency 04/16/2013   Infection of urinary tract 04/16/2013   Past Medical History:  Diagnosis Date   Breast cyst    Aspirated   Chest pain    reaction to Codeine   Chronic UTI    Fibroid    GERD (gastroesophageal reflux  disease)    Hematuria    Hepatitis    age 24, hospitalized   History of hiatal hernia    large per 06/05/23 CT A/P in Epic   History of kidney stones    Nonpuerperal mastitis 05/1990   OAB (overactive bladder)    Follows w/ [facility].   PONV (postoperative nausea and vomiting)    TMJ (sprain of temporomandibular joint)    around 2018   Family History  Problem Relation Age of Onset   Breast cancer Mother 70   Heart failure Father    Emphysema Father        Died age 86   Breast cancer Maternal Grandmother 78   Past Surgical History:  Procedure Laterality Date   APPENDECTOMY  07/10/1957   BREAST CYST ASPIRATION     CATARACT EXTRACTION Right 09/08/2015   Dr. Camillo   CHOLECYSTECTOMY  07/10/1986   CYSTOSCOPY N/A 07/16/2023   Procedure: CYSTOSCOPY with hydrodistention and intravesical botox  injection (100 units);  Surgeon: Marilynne Rosaline SAILOR, MD;  Location: [facility];  Service: Gynecology;  Laterality: N/A;   Hysteroscopic resection  04/10/1999   small fibroid polyps   LITHOTRIPSY     TONSILLECTOMY AND ADENOIDECTOMY  07/10/1944   TUBAL  LIGATION  07/10/1977   BTL   Social History   Occupational History   Occupation: Retired.  Tobacco Use   Smoking status: Never   Smokeless tobacco: Never  Vaping Use   Vaping status: Never Used  Substance and Sexual Activity   Alcohol use: No   Drug use: No   Sexual activity: Not Currently    Partners: Male    Birth control/protection: Post-menopausal

## 2023-08-20 ENCOUNTER — Encounter: Payer: Medicare Other | Admitting: Obstetrics and Gynecology

## 2023-08-20 ENCOUNTER — Ambulatory Visit (HOSPITAL_BASED_OUTPATIENT_CLINIC_OR_DEPARTMENT_OTHER): Payer: Medicare Other | Admitting: Obstetrics & Gynecology

## 2023-08-20 ENCOUNTER — Other Ambulatory Visit (HOSPITAL_BASED_OUTPATIENT_CLINIC_OR_DEPARTMENT_OTHER): Payer: Self-pay

## 2023-08-20 ENCOUNTER — Encounter (HOSPITAL_BASED_OUTPATIENT_CLINIC_OR_DEPARTMENT_OTHER): Payer: Self-pay | Admitting: Obstetrics & Gynecology

## 2023-08-20 VITALS — BP 144/82 | HR 68 | Ht 64.0 in | Wt 135.4 lb

## 2023-08-20 DIAGNOSIS — Z01419 Encounter for gynecological examination (general) (routine) without abnormal findings: Secondary | ICD-10-CM | POA: Diagnosis not present

## 2023-08-20 DIAGNOSIS — Z8659 Personal history of other mental and behavioral disorders: Secondary | ICD-10-CM

## 2023-08-20 DIAGNOSIS — N390 Urinary tract infection, site not specified: Secondary | ICD-10-CM

## 2023-08-20 DIAGNOSIS — N3281 Overactive bladder: Secondary | ICD-10-CM

## 2023-08-20 MED ORDER — CITALOPRAM HYDROBROMIDE 20 MG PO TABS
20.0000 mg | ORAL_TABLET | Freq: Every day | ORAL | 3 refills | Status: AC
  Filled 2023-08-20 – 2023-09-04 (×3): qty 90, 90d supply, fill #0
  Filled 2023-11-27: qty 90, 90d supply, fill #1
  Filled 2024-03-02: qty 90, 90d supply, fill #2
  Filled 2024-06-04: qty 90, 90d supply, fill #3

## 2023-08-20 NOTE — Progress Notes (Signed)
 83 y.o. G1P1 Widowed White or Caucasian female here for breast and pelvic exam.  I am also following her for h/o UTIs and OAB.  She has seen Dr. Frutoso Jing and axconics was used as a trial.  This did nothing.  She has also had a cystoscopy with botox  injection.  This did not help as well.  She leaks very frequently.  She hasn't had any UTIs.  She is using a compounded cream with baclofen, amitriptyline, gabapentin that has been written by urogynecology as well.  Does not need RF.    Denies vaginal bleeding.  Patient's last menstrual period was 07/11/1999.         Is on citalopram  20mg  daily.  Needs RF.      Health Maintenance: PCP:  Dr. Candi Chafe.  Last wellness appt was in [id number].  Did blood work at that appt:  yes Vaccines are up to date:  yes Colonoscopy:  04/08/2012, no additional colonoscopy recommended.  CT done 06/2023 MMG:  01/08/2023 Negative BMD:  05/25/2022, osteopenia Last pap smear:  01/29/2018 Negative.   H/o abnormal pap smear:  no    reports that she has never smoked. She has never used smokeless tobacco. She reports that she does not drink alcohol and does not use drugs.  Past Medical History:  Diagnosis Date   Breast cyst    Aspirated   Chest pain    reaction to Codeine   Chronic UTI    Fibroid    GERD (gastroesophageal reflux disease)    Hematuria    Hepatitis    age 75, hospitalized   History of hiatal hernia    large per 06/05/23 CT A/P in Epic   History of kidney stones    Nonpuerperal mastitis 05/1990   OAB (overactive bladder)    Follows w/ [facility].   PONV (postoperative nausea and vomiting)    TMJ (sprain of temporomandibular joint)    around 2018    Past Surgical History:  Procedure Laterality Date   APPENDECTOMY  07/10/1957   BREAST CYST ASPIRATION     CATARACT EXTRACTION Right 09/08/2015   Dr. Danley Dusky   CHOLECYSTECTOMY  07/10/1986   CYSTOSCOPY N/A 07/16/2023   Procedure: CYSTOSCOPY with hydrodistention and intravesical botox  injection (100  units);  Surgeon: Arma Lamp, MD;  Location: [facility];  Service: Gynecology;  Laterality: N/A;   Hysteroscopic resection  04/10/1999   small fibroid polyps   LITHOTRIPSY     TONSILLECTOMY AND ADENOIDECTOMY  07/10/1944   TUBAL LIGATION  07/10/1977   BTL    Current Outpatient Medications  Medication Sig Dispense Refill   Ascorbic Acid (VITAMIN C ) 1000 MG tablet Take 1 tablet (1,000 mg total) by mouth in the morning, at noon, in the evening, and at bedtime. (Patient taking differently: Take 1,000 mg by mouth in the morning and at bedtime.) 120 tablet 2   Estradiol  10 MCG TABS vaginal tablet PLACE 1 TABLET VAGINALLY 2 TIMES A WEEK. 24 tablet 3   Evolocumab  (REPATHA  SURECLICK) 140 MG/ML SOAJ Inject 140 mg into the skin every 14 (fourteen) days. 6 mL 1   methenamine  (HIPREX ) 1 g tablet Take 1 tablet (1 g total) by mouth 2 (two) times daily with a meal. 60 tablet 11   methenamine  (MANDELAMINE) 1 g tablet Take 1 tablet (1 g total) by mouth 2 (two) times daily with a meal. 60 tablet 11   Multiple Vitamins-Minerals (PRESERVISION AREDS PO) Take by mouth.     NONFORMULARY OR COMPOUNDED  ITEM Amitriptyline 2.5%/ gabapentin 2.5%/ baclofen 2.5% in vaginal cream.  Place 1g daily in vaginal/ vulvar area.  Dispense 60g with 5 refills. 60 each 5   omeprazole  (PRILOSEC) 10 MG capsule Take 1 capsule (10 mg total) by mouth daily. 360 capsule 0   omeprazole  (PRILOSEC) 10 MG capsule Take 1 capsule (10 mg total) by mouth daily. 90 capsule 4   Probiotic Product (ALIGN) 4 MG CAPS      REPATHA  SURECLICK 140 MG/ML SOAJ Inject 1 mL into the skin every 14 (fourteen) days.     vitamin B-12 (CYANOCOBALAMIN ) 100 MCG tablet Take 100 mcg by mouth daily.     citalopram  (CELEXA ) 20 MG tablet Take 1 tablet (20 mg total) by mouth daily. 90 tablet 3   No current facility-administered medications for this visit.    Family History  Problem Relation Age of Onset   Breast cancer Mother 65   Heart  failure Father    Emphysema Father        Died age 14   Breast cancer Maternal Grandmother 50    Review of Systems  Constitutional: Negative.   Genitourinary: Negative.     Exam:   BP (!) 155/71 (BP Location: Left Arm, Patient Position: Sitting, Cuff Size: Large)   Pulse 68   Ht 5\' 4"  (1.626 m)   Wt 135 lb 6.4 oz (61.4 kg)   LMP 07/11/1999   BMI 23.24 kg/m   Height: 5\' 4"  (162.6 cm)  General appearance: alert, cooperative and appears stated age Breasts: normal appearance, no masses or tenderness Abdomen: soft, non-tender; bowel sounds normal; no masses,  no organomegaly Lymph nodes: Cervical, supraclavicular, and axillary nodes normal.  No abnormal inguinal nodes palpated Neurologic: Grossly normal  Pelvic: External genitalia:  no lesions              Urethra:  normal appearing urethra with no masses, tenderness or lesions              Bartholins and Skenes: normal                 Vagina: normal appearing vagina with atrophic changes and no discharge, no lesions              Cervix: no lesions              Pap taken: No. Bimanual Exam:  Uterus:  normal size, contour, position, consistency, mobility, non-tender              Adnexa: normal adnexa and no mass, fullness, tenderness               Rectovaginal: Confirms               Anus:  normal sphincter tone, no lesions  Chaperone, Arnetta Bianchi, CMA, was present for exam.  Assessment/Plan: 1. Encntr for gyn exam (general) (routine) w/o abn findings (Primary) - Pap smear not indicated - Mammogram 01/2023 - Colonoscopy 2013.  Follow up not recommended but CT was done 06/2023 - Bone mineral density 05/2022.  Will repeat again later this year or early next year.  - lab work done with PCP, Dr. Candi Chafe - vaccines reviewed/updated  2. Recurrent UTI - this is much improved  3. Overactive bladder - currently being followed by Dr. Frutoso Jing at [facility]  4. History of adjustment disorder - citalopram  (CELEXA ) 20 MG tablet; Take 1  tablet (20 mg total) by mouth daily.  Dispense: 90 tablet; Refill: 3

## 2023-08-22 ENCOUNTER — Ambulatory Visit (INDEPENDENT_AMBULATORY_CARE_PROVIDER_SITE_OTHER): Payer: Medicare Other | Admitting: Obstetrics and Gynecology

## 2023-08-22 ENCOUNTER — Encounter: Payer: Medicare Other | Admitting: Obstetrics and Gynecology

## 2023-08-22 ENCOUNTER — Encounter: Payer: Self-pay | Admitting: Obstetrics and Gynecology

## 2023-08-22 VITALS — BP 135/85 | HR 85

## 2023-08-22 DIAGNOSIS — N3281 Overactive bladder: Secondary | ICD-10-CM | POA: Diagnosis not present

## 2023-08-22 NOTE — Progress Notes (Signed)
[facility] Return Visit  SUBJECTIVE  History of Present Illness: Vanessa Trujillo is a 83 y.o. female seen in follow-up for OAB.  Cystoscopy with hydrodistention and intravesical botox injection (100 units). Still wearing 6-8 pads per day and up several times per night and has to wear pullups. Does not drink past 9pm. She feels that she is emptying her bladder well.   She had a recent SI injections and feels her pain has improved. She reports some scoliosis of her lumbar spine.   Prior therapies: myrbetriq, gemtesa, trospium, intravesical botox, PTNS and PNE trial for SNM.   Past Medical History: Patient  has a past medical history of Breast cyst, Chest pain, Chronic UTI, Fibroid, GERD (gastroesophageal reflux disease), Hematuria, Hepatitis, History of hiatal hernia, History of kidney stones, Nonpuerperal mastitis (05/1990), OAB (overactive bladder), PONV (postoperative nausea and vomiting), and TMJ (sprain of temporomandibular joint).   Past Surgical History: She  has a past surgical history that includes Tonsillectomy and adenoidectomy (07/10/1944); Appendectomy (07/10/1957); Tubal ligation (07/10/1977); Cholecystectomy (07/10/1986); Hysteroscopic resection (04/10/1999); Breast cyst aspiration; Lithotripsy; Cataract extraction (Right, 09/08/2015); and Cystoscopy (N/A, 07/16/2023).   Medications: She has a current medication list which includes the following prescription(s): vitamin c, citalopram, estradiol, repatha sureclick, methenamine, methenamine, multiple vitamins-minerals, NONFORMULARY OR COMPOUNDED ITEM, omeprazole, omeprazole, align, repatha sureclick, and vitamin b-12.   Allergies: Patient is allergic to codeine, atorvastatin, pravastatin, rosuvastatin calcium, and zoster vac recomb adjuvanted.   Social History: Patient  reports that she has never smoked. She has never used smokeless tobacco. She reports that she does not drink alcohol and does not use drugs.      OBJECTIVE     Physical Exam: Vitals:   08/22/23 1406  BP: 135/85  Pulse: 85   Gen: No apparent distress, A&O x 3.  Detailed Urogynecologic Evaluation:  Deferred. Prior exam showed:     ASSESSMENT AND PLAN    Vanessa Trujillo is a 83 y.o. with:  1. Overactive bladder    - We discussed that she had low bladder capacity under anesthesia ( ) so this is likely affecting her outcomes. But she did not see any improvement with intravesical botox. We do have the option of increasing dose to 200 units, but this can also increase the risk of urinary retention.  - Alternatively, we discussed again the option of Stage 1 SNM in OR since she did not have good results with PNE. Its possible that her scoliosis distorted placement of the trial lead and we could get a more optimal placement in the OR. She feels this would be a good option for her. We discussed bilateral lead placement so that we can trial both sides to get the best outcome.  - If the trial goes well, we would then go to OR for IPG placement and removal of one of the leads.  - She would like to proceed with Stage 1 SNM ([facility]). Will send surgery request. She has vacation coming up and will be back early April.    Marguerita Beards, MD

## 2023-08-25 ENCOUNTER — Ambulatory Visit (HOSPITAL_BASED_OUTPATIENT_CLINIC_OR_DEPARTMENT_OTHER): Payer: Medicare Other | Admitting: Physical Therapy

## 2023-08-25 NOTE — Therapy (Deleted)
 OUTPATIENT PHYSICAL THERAPY TREATMENT  Patient Name: Vanessa Trujillo MRN: [id number] DOB:[birth year], 83 y.o., female Today's Date: 08/25/2023  END OF SESSION:    Past Medical History:  Diagnosis Date   Breast cyst    Aspirated   Chest pain    reaction to Codeine   Chronic UTI    Fibroid    GERD (gastroesophageal reflux disease)    Hematuria    Hepatitis    age 44, hospitalized   History of hiatal hernia    large per 06/05/23 CT A/P in Epic   History of kidney stones    Nonpuerperal mastitis 05/1990   OAB (overactive bladder)    Follows w/ [facility].   PONV (postoperative nausea and vomiting)    TMJ (sprain of temporomandibular joint)    around 2018   Past Surgical History:  Procedure Laterality Date   APPENDECTOMY  07/10/1957   BREAST CYST ASPIRATION     CATARACT EXTRACTION Right 09/08/2015   Dr. Hazle Quant   CHOLECYSTECTOMY  07/10/1986   CYSTOSCOPY N/A 07/16/2023   Procedure: CYSTOSCOPY with hydrodistention and intravesical botox injection (100 units);  Surgeon: Marguerita Beards, MD;  Location: [facility];  Service: Gynecology;  Laterality: N/A;   Hysteroscopic resection  04/10/1999   small fibroid polyps   LITHOTRIPSY     TONSILLECTOMY AND ADENOIDECTOMY  07/10/1944   TUBAL LIGATION  07/10/1977   BTL   Patient Active Problem List   Diagnosis Date Noted   Medication monitoring encounter 08/09/2022   Overactive bladder 10/18/2021   Recurrent UTI 10/18/2021   Depression 06/17/2016   Gastroesophageal reflux disease without esophagitis    Chest pain at rest 06/16/2016   Cough 06/16/2016   Hyperglycemia 06/16/2016   Calculus of kidney 04/16/2013   Urge incontinence 04/16/2013   Urinary urgency 04/16/2013   Infection of urinary tract 04/16/2013    REFERRING PROVIDER:  Huel Cote, MD   REFERRING DIAG:  M53.3,G89.29 (ICD-10-CM) - Chronic left SI joint pain    Pelvic floor /SI joint Scapular dyskinesis program  Rationale for  Evaluation and Treatment: Rehabilitation  THERAPY DIAG:  No diagnosis found.  ONSET DATE: 3-4 wk back/SIJ; 6-7 mo for shoulder   SUBJECTIVE:                                                                                                                                                                                           SUBJECTIVE STATEMENT: SIJ pain probably 3-4 wk ago. When I am sleeping, my legs/knees hurt so bad and wakes me up. Was taking a seated yoga class and felt that exacerbated it.  Bucket list  trip end of March for 19 days.  Shoulder pain on Left in last 6-7 months.   PERTINENT HISTORY:  See PMH  PAIN:  Are you having pain? Yes: NPRS scale: not really pain Pain location: SIJ Pain description: its just there Aggravating factors: constant Relieving factors: move around  PRECAUTIONS:  None  RED FLAGS: None   WEIGHT BEARING RESTRICTIONS:  No  FALLS:  Has patient fallen in last 6 months? No   OCCUPATION:  retired  PLOF:  Independent  PATIENT GOALS:  Decrease pain, get ready for trip   OBJECTIVE:  Note: Objective measures were completed at Evaluation unless otherwise noted.  DIAGNOSTIC FINDINGS:  Mild lumbar levoscoliosis seen in xray taken 08/10/23  PATIENT SURVEYS:  Modified Oswestry 15 (did not answer #8)  Quick Dash 45  COGNITIVE STATUS: Within functional limits for tasks assessed   SENSATION: WFL    POSTURE:  EVAL:   HAND DOMINANCE:  Right   Body Part #1 Lumbar   LOWER EXTREMITY MMT:    MMT (lb) Right eval Left eval  Hip flexion    Hip extension    Hip abduction (SL at knee) 15.9 6.4  Hip adduction    Hip internal rotation    Hip external rotation    Knee flexion 12.0 10.2  Knee extension 26.3 11.0   (Blank rows = not tested)                                                                                                                               TREATMENT DATE:   Treatment                             2/3:  Supine rib flare reduction with transverse set Lt sidelying oblique/Rt abd engagement Seated posture with ab set Standing posture- Rt foot back+glut set    PATIENT EDUCATION:  Education details: Anatomy of condition, POC, HEP, exercise form/rationale Person educated: Patient Education method: Explanation, Demonstration, Tactile cues, Verbal cues, and Handouts Education comprehension: verbalized understanding, returned demonstration, verbal cues required, tactile cues required, and needs further education  HOME EXERCISE PROGRAM:  [email or web address]  Access Code: [email or web address]   ASSESSMENT:  CLINICAL IMPRESSION: Patient is a 83 y.o. F who was seen today for physical therapy evaluation and treatment for SIJ pain and scapular dyskinesis. Xrays show lumbar levoscoliosis and visual completion of S curve through thoracic spine. Focus today on engagement of central stability and reducing Lt lower rib cage flare to improve lumbar stability and allow mobilization of Lt anterior/superior rib cage for Lt shoulder mobility without impingement. Pt has a big trip coming up in march and would like to be prepared for that.  Significant weakness notable along biomechanical chain. As she is fearful of hurting herself, she will focus on walking for cardio/endurance and exercises given today for NM re-ed.      REHAB POTENTIAL: Good  CLINICAL  DECISION MAKING: Evolving/moderate complexity  EVALUATION COMPLEXITY: Low   GOALS: Goals reviewed with patient? Yes  SHORT TERM GOALS: Target date: 2/24  Able to demo controlled rib cage flare reduction in supine, sidelying and quadruped Baseline: Goal status: INITIAL  2.  Gross strength to increase by 10% Baseline:  Goal status: INITIAL  3.  Able to complete exercises without  increase in concordant pain Baseline:  Goal status: INITIAL   LONG TERM GOALS: Target date:  POC date  Functional outcome scores to improve by MDC Baseline:   Goal status: INITIAL  2.  Rt to Lt strength measures 85% of opposite side Baseline:  Goal status: INITIAL  3.  Able to stand from chairs without use of UEs Baseline:  Goal status: INITIAL  4.  Pt will verbalize preparedness for her trip with understanding of HEP to continue Baseline:  Goal status: INITIAL    PLAN:  PT FREQUENCY: 1-2x/week  PT DURATION: 8 weeks  PLANNED INTERVENTIONS: [id number]- PT Re-evaluation, 97110-Therapeutic exercises, 97530- Therapeutic activity, 97112- Neuromuscular re-education, 97535- Self Care, 91478- Manual therapy, [phone]- Gait training, [phone]- Aquatic Therapy, Patient/Family education, Balance training, Stair training, Taping, Dry Needling, Joint mobilization, Spinal mobilization, Cryotherapy, and Moist heat.  PLAN FOR NEXT SESSION: continue core stability for Lt obliques/Rt abductors; qped core; gross quad/HS/gastroc strength with postural cues  Royal Hawthorn PT, DPT 08/25/23  8:17 AM    Date of referral: 08/10/23 REFERRING PROVIDER:  Huel Cote, MD   REFERRING DIAG:  [phone] (ICD-10-CM) - Chronic left SI joint pain   Treatment diagnosis? (if different than referring diagnosis) M54.59, M25.512, M25.511, R29.3  What was this (referring dx) caused by? Ongoing Issue  Ashby Dawes of Condition: Chronic (continuous duration > 3 months)   Laterality: Both  Current Functional Measure Score: Modified Oswestry 15 (did not answer #8)  Quick Dash 45  Objective measurements identify impairments when they are compared to normal values, the uninvolved extremity, and prior level of function.  [x]  Yes  []  No  Objective assessment of functional ability: Moderate functional limitations   SIJ pain probably 3-4 wk ago. When I am sleeping, my legs/knees hurt so bad and wakes me up. Was taking a seated yoga class and felt that exacerbated it.  Bucket list trip end of March for 19 days.  Shoulder pain on Left in last 6-7 months.   Average pain  intensity:  Last 24 hours: mod-severe  Past week: mod-severe  How often does the pt experience symptoms? Frequently  How much have the symptoms interfered with usual daily activities? Quite a bit  How has condition changed since care began at this facility? NA - initial visit  In general, how is the patients overall health? Very Good   BACK PAIN (STarT Back Screening Tool) No

## 2023-08-27 ENCOUNTER — Encounter (HOSPITAL_BASED_OUTPATIENT_CLINIC_OR_DEPARTMENT_OTHER): Payer: Self-pay | Admitting: Physical Therapy

## 2023-08-27 ENCOUNTER — Ambulatory Visit (HOSPITAL_BASED_OUTPATIENT_CLINIC_OR_DEPARTMENT_OTHER): Payer: Medicare Other | Admitting: Physical Therapy

## 2023-08-27 ENCOUNTER — Other Ambulatory Visit (HOSPITAL_BASED_OUTPATIENT_CLINIC_OR_DEPARTMENT_OTHER): Payer: Self-pay

## 2023-08-27 DIAGNOSIS — G8929 Other chronic pain: Secondary | ICD-10-CM | POA: Diagnosis not present

## 2023-08-27 DIAGNOSIS — M25511 Pain in right shoulder: Secondary | ICD-10-CM | POA: Diagnosis not present

## 2023-08-27 DIAGNOSIS — R293 Abnormal posture: Secondary | ICD-10-CM | POA: Diagnosis not present

## 2023-08-27 DIAGNOSIS — M25512 Pain in left shoulder: Secondary | ICD-10-CM | POA: Diagnosis not present

## 2023-08-27 DIAGNOSIS — M5459 Other low back pain: Secondary | ICD-10-CM | POA: Diagnosis not present

## 2023-08-27 DIAGNOSIS — M533 Sacrococcygeal disorders, not elsewhere classified: Secondary | ICD-10-CM | POA: Diagnosis not present

## 2023-08-27 NOTE — Therapy (Signed)
OUTPATIENT PHYSICAL THERAPY TREATMENT  Patient Name: Vanessa Trujillo MRN: [id number] DOB:[birth year], 83 y.o., female Today's Date: 08/27/2023  END OF SESSION:  PT End of Session - 08/27/23 1317     Visit Number 2    Number of Visits 17    Date for PT Re-Evaluation 10/08/23    Authorization Type U[employer] MCR    Progress Note Due on Visit 10    PT Start Time 1317    PT Stop Time 1359    PT Time Calculation (min) 42 min    Activity Tolerance Patient tolerated treatment well    Behavior During Therapy WFL for tasks assessed/performed              Past Medical History:  Diagnosis Date   Breast cyst    Aspirated   Chest pain    reaction to Codeine   Chronic UTI    Fibroid    GERD (gastroesophageal reflux disease)    Hematuria    Hepatitis    age 3, hospitalized   History of hiatal hernia    large per 06/05/23 CT A/P in Epic   History of kidney stones    Nonpuerperal mastitis 05/1990   OAB (overactive bladder)    Follows w/ [facility].   PONV (postoperative nausea and vomiting)    TMJ (sprain of temporomandibular joint)    around 2018   Past Surgical History:  Procedure Laterality Date   APPENDECTOMY  07/10/1957   BREAST CYST ASPIRATION     CATARACT EXTRACTION Right 09/08/2015   Dr. Hazle Quant   CHOLECYSTECTOMY  07/10/1986   CYSTOSCOPY N/A 07/16/2023   Procedure: CYSTOSCOPY with hydrodistention and intravesical botox injection (100 units);  Surgeon: Marguerita Beards, MD;  Location: [facility];  Service: Gynecology;  Laterality: N/A;   Hysteroscopic resection  04/10/1999   small fibroid polyps   LITHOTRIPSY     TONSILLECTOMY AND ADENOIDECTOMY  07/10/1944   TUBAL LIGATION  07/10/1977   BTL   Patient Active Problem List   Diagnosis Date Noted   Medication monitoring encounter 08/09/2022   Overactive bladder 10/18/2021   Recurrent UTI 10/18/2021   Depression 06/17/2016   Gastroesophageal reflux disease without esophagitis    Chest pain at  rest 06/16/2016   Cough 06/16/2016   Hyperglycemia 06/16/2016   Calculus of kidney 04/16/2013   Urge incontinence 04/16/2013   Urinary urgency 04/16/2013   Infection of urinary tract 04/16/2013    REFERRING PROVIDER:  Huel Cote, MD   REFERRING DIAG:  M53.3,G89.29 (ICD-10-CM) - Chronic left SI joint pain    Pelvic floor /SI joint Scapular dyskinesis program  Rationale for Evaluation and Treatment: Rehabilitation  THERAPY DIAG:  Other low back pain  Chronic left shoulder pain  Chronic right shoulder pain  ONSET DATE: 3-4 wk back/SIJ; 6-7 mo for shoulder   SUBJECTIVE:  SUBJECTIVE STATEMENT: Injections made a world of difference! I have had some lower back aches.   EVAL: SIJ pain probably 3-4 wk ago. When I am sleeping, my legs/knees hurt so bad and wakes me up. Was taking a seated yoga class and felt that exacerbated it.  Bucket list trip end of March for 19 days.  Shoulder pain on Left in last 6-7 months.   PERTINENT HISTORY:  See PMH  PAIN:  Are you having pain? Yes: NPRS scale: not really pain Pain location: SIJ Pain description: its just there Aggravating factors: constant Relieving factors: move around  PRECAUTIONS:  None  RED FLAGS: None   WEIGHT BEARING RESTRICTIONS:  No  FALLS:  Has patient fallen in last 6 months? No   OCCUPATION:  retired  PLOF:  Independent  PATIENT GOALS:  Decrease pain, get ready for trip   OBJECTIVE:  Note: Objective measures were completed at Evaluation unless otherwise noted.  DIAGNOSTIC FINDINGS:  Mild lumbar levoscoliosis seen in xray taken 08/10/23  PATIENT SURVEYS:  Modified Oswestry 15 (did not answer #8)  Quick Dash 45  COGNITIVE STATUS: Within functional limits for tasks  assessed   SENSATION: WFL    POSTURE:  EVAL:   HAND DOMINANCE:  Right   Body Part #1 Lumbar   LOWER EXTREMITY MMT:    MMT (lb) Right eval Left eval  Hip flexion    Hip extension    Hip abduction (SL at knee) 15.9 6.4  Hip adduction    Hip internal rotation    Hip external rotation    Knee flexion 12.0 10.2  Knee extension 26.3 11.0   (Blank rows = not tested)                                                                                                                               TREATMENT DATE:   2/17: Seated posture Standing posture- ab set Seated hip hinge to stand, ball bw knees Heel raises ball bw ankles Standing gastroc stretch Seated hamstring stretch Forward step ups 4"- moved to staircase A/p weight shift with glut set- prog to walking around track  Treatment                            2/3:  Supine rib flare reduction with transverse set Lt sidelying oblique/Rt abd engagement Seated posture with ab set Standing posture- Rt foot back+glut set    PATIENT EDUCATION:  Education details: Anatomy of condition, POC, HEP, exercise form/rationale Person educated: Patient Education method: Explanation, Demonstration, Tactile cues, Verbal cues, and Handouts Education comprehension: verbalized understanding, returned demonstration, verbal cues required, tactile cues required, and needs further education  HOME EXERCISE PROGRAM:  [email or web address]  Access Code: [email or web address]   ASSESSMENT:  CLINICAL IMPRESSION: Asked to focus on habits of sitting on ischial tuberosities, moving weight to heels in standing and glut activation in gait.  REHAB POTENTIAL: Good  CLINICAL DECISION MAKING: Evolving/moderate complexity  EVALUATION COMPLEXITY: Low   GOALS: Goals reviewed with patient? Yes  SHORT TERM GOALS: Target date: 2/24  Able to demo controlled rib cage flare reduction in supine, sidelying and quadruped Baseline: Goal status:  INITIAL  2.  Gross strength to increase by 10% Baseline:  Goal status: INITIAL  3.  Able to complete exercises without  increase in concordant pain Baseline:  Goal status: INITIAL   LONG TERM GOALS: Target date:  POC date  Functional outcome scores to improve by MDC Baseline:  Goal status: INITIAL  2.  Rt to Lt strength measures 85% of opposite side Baseline:  Goal status: INITIAL  3.  Able to stand from chairs without use of UEs Baseline:  Goal status: INITIAL  4.  Pt will verbalize preparedness for her trip with understanding of HEP to continue Baseline:  Goal status: INITIAL    PLAN:  PT FREQUENCY: 1-2x/week  PT DURATION: 8 weeks  PLANNED INTERVENTIONS: 97164- PT Re-evaluation, 97110-Therapeutic exercises, 97530- Therapeutic activity, 97112- Neuromuscular re-education, 97535- Self Care, 14782- Manual therapy, [phone]- Gait training, [phone]- Aquatic Therapy, Patient/Family education, Balance training, Stair training, Taping, Dry Needling, Joint mobilization, Spinal mobilization, Cryotherapy, and Moist heat.  PLAN FOR NEXT SESSION: continue core stability for Lt obliques/Rt abductors; qped core; gross quad/HS/gastroc strength with postural cues  Elizardo Chilson C. Terrill Alperin PT, DPT 08/27/23 2:00 PM     Date of referral: 08/10/23 REFERRING PROVIDER:  Huel Cote, MD   REFERRING DIAG:  [phone] (ICD-10-CM) - Chronic left SI joint pain   Treatment diagnosis? (if different than referring diagnosis) M54.59, M25.512, M25.511, R29.3  What was this (referring dx) caused by? Ongoing Issue  Ashby Dawes of Condition: Chronic (continuous duration > 3 months)   Laterality: Both  Current Functional Measure Score: Modified Oswestry 15 (did not answer #8)  Quick Dash 45  Objective measurements identify impairments when they are compared to normal values, the uninvolved extremity, and prior level of function.  [x]  Yes  []  No  Objective assessment of functional ability:  Moderate functional limitations   SIJ pain probably 3-4 wk ago. When I am sleeping, my legs/knees hurt so bad and wakes me up. Was taking a seated yoga class and felt that exacerbated it.  Bucket list trip end of March for 19 days.  Shoulder pain on Left in last 6-7 months.   Average pain intensity:  Last 24 hours: mod-severe  Past week: mod-severe  How often does the pt experience symptoms? Frequently  How much have the symptoms interfered with usual daily activities? Quite a bit  How has condition changed since care began at this facility? NA - initial visit  In general, how is the patients overall health? Very Good   BACK PAIN (STarT Back Screening Tool) No

## 2023-08-28 ENCOUNTER — Other Ambulatory Visit (HOSPITAL_BASED_OUTPATIENT_CLINIC_OR_DEPARTMENT_OTHER): Payer: Self-pay

## 2023-08-29 ENCOUNTER — Telehealth (HOSPITAL_BASED_OUTPATIENT_CLINIC_OR_DEPARTMENT_OTHER): Payer: Self-pay | Admitting: Orthopaedic Surgery

## 2023-08-29 ENCOUNTER — Encounter (HOSPITAL_BASED_OUTPATIENT_CLINIC_OR_DEPARTMENT_OTHER): Payer: Self-pay | Admitting: Orthopaedic Surgery

## 2023-08-29 ENCOUNTER — Other Ambulatory Visit (HOSPITAL_BASED_OUTPATIENT_CLINIC_OR_DEPARTMENT_OTHER): Payer: Self-pay

## 2023-08-29 ENCOUNTER — Other Ambulatory Visit (HOSPITAL_BASED_OUTPATIENT_CLINIC_OR_DEPARTMENT_OTHER): Payer: Self-pay | Admitting: Orthopaedic Surgery

## 2023-08-29 MED ORDER — MELOXICAM 15 MG PO TABS
15.0000 mg | ORAL_TABLET | Freq: Every day | ORAL | 0 refills | Status: DC
  Filled 2023-08-29: qty 30, 30d supply, fill #0

## 2023-08-29 NOTE — Telephone Encounter (Signed)
Patient states that she has a pain in her left shoulder pain that Dr B discuss with her. She said she wanted to know if he could call her in something or does she need to be seen. Best contact number [phone]

## 2023-08-30 ENCOUNTER — Other Ambulatory Visit: Payer: Self-pay

## 2023-08-30 ENCOUNTER — Other Ambulatory Visit (HOSPITAL_BASED_OUTPATIENT_CLINIC_OR_DEPARTMENT_OTHER): Payer: Self-pay

## 2023-08-30 ENCOUNTER — Other Ambulatory Visit (HOSPITAL_COMMUNITY): Payer: Self-pay

## 2023-08-30 DIAGNOSIS — N390 Urinary tract infection, site not specified: Secondary | ICD-10-CM

## 2023-08-30 MED ORDER — METHENAMINE HIPPURATE 1 G PO TABS
1.0000 g | ORAL_TABLET | Freq: Two times a day (BID) | ORAL | 11 refills | Status: DC
  Filled 2023-08-30: qty 60, 30d supply, fill #0
  Filled 2023-09-19 – 2023-10-20 (×2): qty 60, 30d supply, fill #1

## 2023-08-30 NOTE — Telephone Encounter (Signed)
Patient called requesting the methenamine medication be sent to the [facility] at [facility].

## 2023-08-31 ENCOUNTER — Other Ambulatory Visit: Payer: Self-pay

## 2023-08-31 ENCOUNTER — Other Ambulatory Visit (HOSPITAL_BASED_OUTPATIENT_CLINIC_OR_DEPARTMENT_OTHER): Payer: Self-pay

## 2023-09-04 ENCOUNTER — Other Ambulatory Visit (HOSPITAL_BASED_OUTPATIENT_CLINIC_OR_DEPARTMENT_OTHER): Payer: Self-pay

## 2023-09-05 ENCOUNTER — Ambulatory Visit (HOSPITAL_BASED_OUTPATIENT_CLINIC_OR_DEPARTMENT_OTHER): Payer: Medicare Other | Admitting: Physical Therapy

## 2023-09-05 ENCOUNTER — Encounter (HOSPITAL_BASED_OUTPATIENT_CLINIC_OR_DEPARTMENT_OTHER): Payer: Self-pay | Admitting: Physical Therapy

## 2023-09-05 DIAGNOSIS — M5459 Other low back pain: Secondary | ICD-10-CM | POA: Diagnosis not present

## 2023-09-05 DIAGNOSIS — M25511 Pain in right shoulder: Secondary | ICD-10-CM | POA: Diagnosis not present

## 2023-09-05 DIAGNOSIS — G8929 Other chronic pain: Secondary | ICD-10-CM | POA: Diagnosis not present

## 2023-09-05 DIAGNOSIS — R293 Abnormal posture: Secondary | ICD-10-CM | POA: Diagnosis not present

## 2023-09-05 DIAGNOSIS — M533 Sacrococcygeal disorders, not elsewhere classified: Secondary | ICD-10-CM | POA: Diagnosis not present

## 2023-09-05 DIAGNOSIS — M25512 Pain in left shoulder: Secondary | ICD-10-CM | POA: Diagnosis not present

## 2023-09-05 NOTE — Therapy (Signed)
 OUTPATIENT PHYSICAL THERAPY TREATMENT  Patient Name: Vanessa Trujillo MRN: [id number] DOB:Jul 08, 1941, 83 y.o., female Today's Date: 09/05/2023  END OF SESSION:  PT End of Session - 09/05/23 1323     Visit Number 3    Number of Visits 17    Date for PT Re-Evaluation 10/08/23    Authorization Type UHC MCR    Progress Note Due on Visit 10    PT Start Time 1320    PT Stop Time 1359    PT Time Calculation (min) 39 min    Activity Tolerance Patient tolerated treatment well    Behavior During Therapy WFL for tasks assessed/performed               Past Medical History:  Diagnosis Date   Breast cyst    Aspirated   Chest pain    reaction to Codeine   Chronic UTI    Fibroid    GERD (gastroesophageal reflux disease)    Hematuria    Hepatitis    age 39, hospitalized   History of hiatal hernia    large per 06/05/23 CT A/P in Epic   History of kidney stones    Nonpuerperal mastitis 05/1990   OAB (overactive bladder)    Follows w/ [facility].   PONV (postoperative nausea and vomiting)    TMJ (sprain of temporomandibular joint)    around 2018   Past Surgical History:  Procedure Laterality Date   APPENDECTOMY  07/10/1957   BREAST CYST ASPIRATION     CATARACT EXTRACTION Right 09/08/2015   Dr. Hazle Quant   CHOLECYSTECTOMY  07/10/1986   CYSTOSCOPY N/A 07/16/2023   Procedure: CYSTOSCOPY with hydrodistention and intravesical botox injection (100 units);  Surgeon: Marguerita Beards, MD;  Location: [facility];  Service: Gynecology;  Laterality: N/A;   Hysteroscopic resection  04/10/1999   small fibroid polyps   LITHOTRIPSY     TONSILLECTOMY AND ADENOIDECTOMY  07/10/1944   TUBAL LIGATION  07/10/1977   BTL   Patient Active Problem List   Diagnosis Date Noted   Medication monitoring encounter 08/09/2022   Overactive bladder 10/18/2021   Recurrent UTI 10/18/2021   Depression 06/17/2016   Gastroesophageal reflux disease without esophagitis    Chest pain  at rest 06/16/2016   Cough 06/16/2016   Hyperglycemia 06/16/2016   Calculus of kidney 04/16/2013   Urge incontinence 04/16/2013   Urinary urgency 04/16/2013   Infection of urinary tract 04/16/2013    REFERRING PROVIDER:  Huel Cote, MD   REFERRING DIAG:  M53.3,G89.29 (ICD-10-CM) - Chronic left SI joint pain    Pelvic floor /SI joint Scapular dyskinesis program  Rationale for Evaluation and Treatment: Rehabilitation  THERAPY DIAG:  Other low back pain  Chronic left shoulder pain  Chronic right shoulder pain  Abnormal posture  ONSET DATE: 3-4 wk back/SIJ; 6-7 mo for shoulder   SUBJECTIVE:  SUBJECTIVE STATEMENT: Much more aware of how I am holding myself/posture. Tightness in upper body and across lower back with activity.   EVAL: SIJ pain probably 3-4 wk ago. When I am sleeping, my legs/knees hurt so bad and wakes me up. Was taking a seated yoga class and felt that exacerbated it.  Bucket list trip end of March for 19 days.  Shoulder pain on Left in last 6-7 months.   PERTINENT HISTORY:  See PMH  PAIN:  Are you having pain? Yes: NPRS scale: not really pain Pain location: SIJ Pain description: its just there Aggravating factors: constant Relieving factors: move around  PRECAUTIONS:  None  RED FLAGS: None   WEIGHT BEARING RESTRICTIONS:  No  FALLS:  Has patient fallen in last 6 months? No   OCCUPATION:  retired  PLOF:  Independent  PATIENT GOALS:  Decrease pain, get ready for trip   OBJECTIVE:  Note: Objective measures were completed at Evaluation unless otherwise noted.  DIAGNOSTIC FINDINGS:  Mild lumbar levoscoliosis seen in xray taken 08/10/23  PATIENT SURVEYS:  Modified Oswestry 15 (did not answer #8)  Quick Dash 45  COGNITIVE STATUS: Within  functional limits for tasks assessed   SENSATION: WFL    POSTURE:  EVAL:   HAND DOMINANCE:  Right   Body Part #1 Lumbar   LOWER EXTREMITY MMT:    MMT (lb) Right eval Left eval  Hip flexion    Hip extension    Hip abduction (SL at knee) 15.9 6.4  Hip adduction    Hip internal rotation    Hip external rotation    Knee flexion 12.0 10.2  Knee extension 26.3 11.0   (Blank rows = not tested)                                                                                                                               TREATMENT DATE:   Treatment                            2/26: Blank lines following charge title = not provided on this treatment date.   Manual:  TPDN YES Trigger Point Dry Needling  Initial Treatment: Pt instructed on Dry Needling rational, procedures, and possible side effects. Pt instructed to expect mild to moderate muscle soreness later in the day and/or into the next day.  Pt instructed in methods to reduce muscle soreness. Pt instructed to continue prescribed HEP. Because Dry Needling was performed over or adjacent to a lung field, pt was educated on S/S of pneumothorax and to seek immediate medical attention should they occur.  Patient was educated on signs and symptoms of infection and other risk factors and advised to seek medical attention should they occur.  Patient verbalized understanding of these instructions and education.  Education time grouped into manual charges  Patient Verbal Consent Given: Yes Education Handout Provided: Yes Muscles Treated: Lt  rhomboids, levator scap Electrical Stimulation Performed: No Treatment Response/Outcome: twitch with decreased concordant pain Prone rib mobs- Lt-sided cavitations There-ex: Scap retraction Shoulder rolls Door pec stretch ER red tband seated Upper trap & levator stretch L stretch There-Act:  Self Care:  Nuro-Re-ed:  Gait Training:    2/17: Seated posture Standing posture-  ab set Seated hip hinge to stand, ball bw knees Heel raises ball bw ankles Standing gastroc stretch Seated hamstring stretch Forward step ups 4"- moved to staircase A/p weight shift with glut set- prog to walking around track  Treatment                            2/3:  Supine rib flare reduction with transverse set Lt sidelying oblique/Rt abd engagement Seated posture with ab set Standing posture- Rt foot back+glut set    PATIENT EDUCATION:  Education details: Anatomy of condition, POC, HEP, exercise form/rationale Person educated: Patient Education method: Explanation, Demonstration, Tactile cues, Verbal cues, and Handouts Education comprehension: verbalized understanding, returned demonstration, verbal cues required, tactile cues required, and needs further education  HOME EXERCISE PROGRAM:  [email or web address]  Access Code: [email or web address]   ASSESSMENT:  CLINICAL IMPRESSION: Focus on upper body posture and mobility. Able to improve rib cage mobility and scapular motion following manual therapy today.      REHAB POTENTIAL: Good  CLINICAL DECISION MAKING: Evolving/moderate complexity  EVALUATION COMPLEXITY: Low   GOALS: Goals reviewed with patient? Yes  SHORT TERM GOALS: Target date: 2/24  Able to demo controlled rib cage flare reduction in supine, sidelying and quadruped Baseline: Goal status: INITIAL  2.  Gross strength to increase by 10% Baseline:  Goal status: INITIAL  3.  Able to complete exercises without  increase in concordant pain Baseline:  Goal status: INITIAL   LONG TERM GOALS: Target date:  POC date  Functional outcome scores to improve by MDC Baseline:  Goal status: INITIAL  2.  Rt to Lt strength measures 85% of opposite side Baseline:  Goal status: INITIAL  3.  Able to stand from chairs without use of UEs Baseline:  Goal status: INITIAL  4.  Pt will verbalize preparedness for her trip with understanding of HEP to  continue Baseline:  Goal status: INITIAL    PLAN:  PT FREQUENCY: 1-2x/week  PT DURATION: 8 weeks  PLANNED INTERVENTIONS: 97164- PT Re-evaluation, 97110-Therapeutic exercises, 97530- Therapeutic activity, 97112- Neuromuscular re-education, 97535- Self Care, 45409- Manual therapy, [phone]- Gait training, [phone]- Aquatic Therapy, Patient/Family education, Balance training, Stair training, Taping, Dry Needling, Joint mobilization, Spinal mobilization, Cryotherapy, and Moist heat.  PLAN FOR NEXT SESSION: continue core stability for Lt obliques/Rt abductors; qped core; gross quad/HS/gastroc strength with postural cues  Latiqua Daloia C. Musa Rewerts PT, DPT 09/05/23 2:04 PM     Date of referral: 08/10/23 REFERRING PROVIDER:  Huel Cote, MD   REFERRING DIAG:  [id number] (ICD-10-CM) - Chronic left SI joint pain   Treatment diagnosis? (if different than referring diagnosis) M54.59, M25.512, M25.511, R29.3  What was this (referring dx) caused by? Ongoing Issue  Ashby Dawes of Condition: Chronic (continuous duration > 3 months)   Laterality: Both  Current Functional Measure Score: Modified Oswestry 15 (did not answer #8)  Quick Dash 45  Objective measurements identify impairments when they are compared to normal values, the uninvolved extremity, and prior level of function.  [x]  Yes  []  No  Objective assessment of functional ability: Moderate functional limitations   SIJ pain probably 3-4 wk ago. When  I am sleeping, my legs/knees hurt so bad and wakes me up. Was taking a seated yoga class and felt that exacerbated it.  Bucket list trip end of March for 19 days.  Shoulder pain on Left in last 6-7 months.   Average pain intensity:  Last 24 hours: mod-severe  Past week: mod-severe  How often does the pt experience symptoms? Frequently  How much have the symptoms interfered with usual daily activities? Quite a bit  How has condition changed since care began at this facility? NA -  initial visit  In general, how is the patients overall health? Very Good   BACK PAIN (STarT Back Screening Tool) No

## 2023-09-05 NOTE — Patient Instructions (Signed)

## 2023-09-08 ENCOUNTER — Ambulatory Visit (HOSPITAL_BASED_OUTPATIENT_CLINIC_OR_DEPARTMENT_OTHER): Payer: Medicare Other | Attending: Nurse Practitioner | Admitting: Rehabilitative and Restorative Service Providers"

## 2023-09-08 ENCOUNTER — Encounter (HOSPITAL_BASED_OUTPATIENT_CLINIC_OR_DEPARTMENT_OTHER): Payer: Self-pay | Admitting: Rehabilitative and Restorative Service Providers"

## 2023-09-08 DIAGNOSIS — M25512 Pain in left shoulder: Secondary | ICD-10-CM | POA: Diagnosis not present

## 2023-09-08 DIAGNOSIS — M25511 Pain in right shoulder: Secondary | ICD-10-CM | POA: Insufficient documentation

## 2023-09-08 DIAGNOSIS — R293 Abnormal posture: Secondary | ICD-10-CM | POA: Insufficient documentation

## 2023-09-08 DIAGNOSIS — G8929 Other chronic pain: Secondary | ICD-10-CM | POA: Diagnosis not present

## 2023-09-08 DIAGNOSIS — M5459 Other low back pain: Secondary | ICD-10-CM | POA: Diagnosis not present

## 2023-09-08 NOTE — Therapy (Signed)
 OUTPATIENT PHYSICAL THERAPY TREATMENT  Patient Name: Vanessa Trujillo MRN: [id number] DOB:1941/05/17, 83 y.o., female Today's Date: 09/08/2023  END OF SESSION:  PT End of Session - 09/08/23 1110     Visit Number 4    Number of Visits 17    Date for PT Re-Evaluation 10/08/23    Authorization Type [employer] MCR    Progress Note Due on Visit 10    PT Start Time 1053    PT Stop Time 1138    PT Time Calculation (min) 45 min    Activity Tolerance Patient tolerated treatment well;No increased pain    Behavior During Therapy [facility] for tasks assessed/performed                Past Medical History:  Diagnosis Date   Breast cyst    Aspirated   Chest pain    reaction to Codeine   Chronic UTI    Fibroid    GERD (gastroesophageal reflux disease)    Hematuria    Hepatitis    age 83, hospitalized   History of hiatal hernia    large per 06/05/23 CT A/P in Epic   History of kidney stones    Nonpuerperal mastitis 05/1990   OAB (overactive bladder)    Follows w/ [facility].   PONV (postoperative nausea and vomiting)    TMJ (sprain of temporomandibular joint)    around 2018   Past Surgical History:  Procedure Laterality Date   APPENDECTOMY  07/10/1957   BREAST CYST ASPIRATION     CATARACT EXTRACTION Right 09/08/2015   Dr. Hazle Quant   CHOLECYSTECTOMY  07/10/1986   CYSTOSCOPY N/A 07/16/2023   Procedure: CYSTOSCOPY with hydrodistention and intravesical botox injection (100 units);  Surgeon: Marguerita Beards, MD;  Location: [facility];  Service: Gynecology;  Laterality: N/A;   Hysteroscopic resection  04/10/1999   small fibroid polyps   LITHOTRIPSY     TONSILLECTOMY AND ADENOIDECTOMY  07/10/1944   TUBAL LIGATION  07/10/1977   BTL   Patient Active Problem List   Diagnosis Date Noted   Medication monitoring encounter 08/09/2022   Overactive bladder 10/18/2021   Recurrent UTI 10/18/2021   Depression 06/17/2016   Gastroesophageal reflux disease without  esophagitis    Chest pain at rest 06/16/2016   Cough 06/16/2016   Hyperglycemia 06/16/2016   Calculus of kidney 04/16/2013   Urge incontinence 04/16/2013   Urinary urgency 04/16/2013   Infection of urinary tract 04/16/2013    REFERRING PROVIDER:  Huel Cote, MD   REFERRING DIAG:  M53.3,G89.29 (ICD-10-CM) - Chronic left SI joint pain    Pelvic floor /SI joint Scapular dyskinesis program  Rationale for Evaluation and Treatment: Rehabilitation  THERAPY DIAG:  Chronic left shoulder pain  Chronic right shoulder pain  Abnormal posture  ONSET DATE: 3-4 wk back/SIJ; 6-7 mo for shoulder   SUBJECTIVE:  SUBJECTIVE STATEMENT: Feel much better since the dry  needling.   EVAL: SIJ pain probably 3-4 wk ago. When I am sleeping, my legs/knees hurt so bad and wakes me up. Was taking a seated yoga class and felt that exacerbated it.  Bucket list trip end of March for 19 days.  Shoulder pain on Left in last 6-7 months.   PERTINENT HISTORY:  See PMH  PAIN:  Are you having pain? Yes: NPRS scale: not really pain Pain location: SIJ Pain description: its just there Aggravating factors: constant Relieving factors: move around  PRECAUTIONS:  None  RED FLAGS: None   WEIGHT BEARING RESTRICTIONS:  No  FALLS:  Has patient fallen in last 6 months? No   OCCUPATION:  retired  PLOF:  Independent  PATIENT GOALS:  Decrease pain, get ready for trip   OBJECTIVE:  Note: Objective measures were completed at Evaluation unless otherwise noted.  DIAGNOSTIC FINDINGS:  Mild lumbar levoscoliosis seen in xray taken 08/10/23  PATIENT SURVEYS:  Modified Oswestry 15 (did not answer #8)  Quick Dash 45  COGNITIVE STATUS: Within functional limits for tasks assessed   SENSATION: WFL    POSTURE:   EVAL:   HAND DOMINANCE:  Right   Body Part #1 Lumbar   LOWER EXTREMITY MMT:    MMT (lb) Right eval Left eval  Hip flexion    Hip extension    Hip abduction (SL at knee) 15.9 6.4  Hip adduction    Hip internal rotation    Hip external rotation    Knee flexion 12.0 10.2  Knee extension 26.3 11.0   (Blank rows = not tested)                                                                                                                               TREATMENT DATE:   Treatment   09/08/23 -L Levator stretch 6 x 15 sec with 3 being with scapular depression manually by PT -Quadriped tilt with alternating UE reaches x 6 but discontinued due to increased pressure in bil glutes by pt request -supine knee to chest 2x30 sec -tilt with L oblique reach x 20 -tilt with ball roll up knees in neutral x 20 -tilt with ball roll alternating reaching for knees x 20 with head down -tilt with hip abduction/addct SLR x 15 -hamstring digs x 20 with 2-3 sec hold -tilt with reverse crunch x 20 -tilt with ball squeeze and LAQ x 15  -tilt with standing oblique reach x 15 each side -tilt with theraball lift into 90 degrees shoulder flexion     Treatment                            2/26: Blank lines following charge title = not provided on this treatment date.   Manual:  TPDN YES Trigger Point Dry Needling  Initial Treatment: Pt instructed on Dry Needling rational, procedures,  and possible side effects. Pt instructed to expect mild to moderate muscle soreness later in the day and/or into the next day.  Pt instructed in methods to reduce muscle soreness. Pt instructed to continue prescribed HEP. Because Dry Needling was performed over or adjacent to a lung field, pt was educated on S/S of pneumothorax and to seek immediate medical attention should they occur.  Patient was educated on signs and symptoms of infection and other risk factors and advised to seek medical attention should they occur.   Patient verbalized understanding of these instructions and education.  Education time grouped into manual charges  Patient Verbal Consent Given: Yes Education Handout Provided: Yes Muscles Treated: Lt rhomboids, levator scap Electrical Stimulation Performed: No Treatment Response/Outcome: twitch with decreased concordant pain Prone rib mobs- Lt-sided cavitations There-ex: Scap retraction Shoulder rolls Door pec stretch ER red tband seated Upper trap & levator stretch L stretch There-Act:  Self Care:  Nuro-Re-ed:  Gait Training:    2/17: Seated posture Standing posture- ab set Seated hip hinge to stand, ball bw knees Heel raises ball bw ankles Standing gastroc stretch Seated hamstring stretch Forward step ups 4"- moved to staircase A/p weight shift with glut set- prog to walking around track  Treatment                            2/3:  Supine rib flare reduction with transverse set Lt sidelying oblique/Rt abd engagement Seated posture with ab set Standing posture- Rt foot back+glut set    PATIENT EDUCATION:  Education details: Anatomy of condition, POC, HEP, exercise form/rationale Person educated: Patient Education method: Explanation, Demonstration, Tactile cues, Verbal cues, and Handouts Education comprehension: verbalized understanding, returned demonstration, verbal cues required, tactile cues required, and needs further education  HOME EXERCISE PROGRAM:  [email or web address]  Access Code: [email or web address]   ASSESSMENT:  CLINICAL IMPRESSION: Focused on core and LE strengthening with good quality control this visit requiring PT verbal, visual and tactile cues for technique. Pt was able to perform all without increased c/o pain. Pt felt dry needling helped neck pain last visit.     REHAB POTENTIAL: Good  CLINICAL DECISION MAKING: Evolving/moderate complexity  EVALUATION COMPLEXITY: Low   GOALS: Goals reviewed with patient? Yes  SHORT TERM  GOALS: Target date: 2/24  Able to demo controlled rib cage flare reduction in supine, sidelying and quadruped Baseline: Goal status: INITIAL  2.  Gross strength to increase by 10% Baseline:  Goal status: INITIAL  3.  Able to complete exercises without  increase in concordant pain Baseline:  Goal status: INITIAL   LONG TERM GOALS: Target date:  POC date  Functional outcome scores to improve by MDC Baseline:  Goal status: INITIAL  2.  Rt to Lt strength measures 85% of opposite side Baseline:  Goal status: INITIAL  3.  Able to stand from chairs without use of UEs Baseline:  Goal status: INITIAL  4.  Pt will verbalize preparedness for her trip with understanding of HEP to continue Baseline:  Goal status: INITIAL    PLAN:  PT FREQUENCY: 1-2x/week  PT DURATION: 8 weeks  PLANNED INTERVENTIONS: 97164- PT Re-evaluation, 97110-Therapeutic exercises, 97530- Therapeutic activity, 97112- Neuromuscular re-education, 97535- Self Care, 16109- Manual therapy, [phone]- Gait training, [phone]- Aquatic Therapy, Patient/Family education, Balance training, Stair training, Taping, Dry Needling, Joint mobilization, Spinal mobilization, Cryotherapy, and Moist heat.  PLAN FOR NEXT SESSION: continue core stability for Lt obliques/Rt abductors; qped core; gross quad/HS/gastroc strength  with postural cues       Date of referral: 08/10/23 REFERRING PROVIDER:  Huel Cote, MD   REFERRING DIAG:  [phone] (ICD-10-CM) - Chronic left SI joint pain   Treatment diagnosis? (if different than referring diagnosis) M54.59, M25.512, M25.511, R29.3  What was this (referring dx) caused by? Ongoing Issue  Ashby Dawes of Condition: Chronic (continuous duration > 3 months)   Laterality: Both  Current Functional Measure Score: Modified Oswestry 15 (did not answer #8)  Quick Dash 45  Objective measurements identify impairments when they are compared to normal values, the uninvolved extremity, and  prior level of function.  [x]  Yes  []  No  Objective assessment of functional ability: Moderate functional limitations   SIJ pain probably 3-4 wk ago. When I am sleeping, my legs/knees hurt so bad and wakes me up. Was taking a seated yoga class and felt that exacerbated it.  Bucket list trip end of March for 19 days.  Shoulder pain on Left in last 6-7 months.   Average pain intensity:  Last 24 hours: mod-severe  Past week: mod-severe  How often does the pt experience symptoms? Frequently  How much have the symptoms interfered with usual daily activities? Quite a bit  How has condition changed since care began at this facility? NA - initial visit  In general, how is the patients overall health? Very Good   BACK PAIN (STarT Back Screening Tool) No

## 2023-09-12 ENCOUNTER — Encounter (HOSPITAL_BASED_OUTPATIENT_CLINIC_OR_DEPARTMENT_OTHER): Payer: Self-pay | Admitting: Physical Therapy

## 2023-09-12 ENCOUNTER — Ambulatory Visit (HOSPITAL_BASED_OUTPATIENT_CLINIC_OR_DEPARTMENT_OTHER): Payer: Medicare Other | Admitting: Physical Therapy

## 2023-09-12 DIAGNOSIS — M25511 Pain in right shoulder: Secondary | ICD-10-CM | POA: Diagnosis not present

## 2023-09-12 DIAGNOSIS — M25512 Pain in left shoulder: Secondary | ICD-10-CM | POA: Diagnosis not present

## 2023-09-12 DIAGNOSIS — M5459 Other low back pain: Secondary | ICD-10-CM | POA: Diagnosis not present

## 2023-09-12 DIAGNOSIS — G8929 Other chronic pain: Secondary | ICD-10-CM

## 2023-09-12 DIAGNOSIS — R293 Abnormal posture: Secondary | ICD-10-CM

## 2023-09-12 NOTE — Therapy (Unsigned)
 OUTPATIENT PHYSICAL THERAPY TREATMENT  Patient Name: Vanessa Trujillo MRN: [id number] DOB:Dec 16, 1940, 83 y.o., female Today's Date: 09/13/2023  END OF SESSION:  PT End of Session - 09/12/23 1317     Visit Number 5    Number of Visits 17    Date for PT Re-Evaluation 10/08/23    Authorization Type UHC MCR    Progress Note Due on Visit 10    PT Start Time 1316    PT Stop Time 1358    PT Time Calculation (min) 42 min    Activity Tolerance Patient tolerated treatment well    Behavior During Therapy WFL for tasks assessed/performed                Past Medical History:  Diagnosis Date   Breast cyst    Aspirated   Chest pain    reaction to Codeine   Chronic UTI    Fibroid    GERD (gastroesophageal reflux disease)    Hematuria    Hepatitis    age 83, hospitalized   History of hiatal hernia    large per 06/05/23 CT A/P in Epic   History of kidney stones    Nonpuerperal mastitis 05/1990   OAB (overactive bladder)    Follows w/ [facility].   PONV (postoperative nausea and vomiting)    TMJ (sprain of temporomandibular joint)    around 2018   Past Surgical History:  Procedure Laterality Date   APPENDECTOMY  07/10/1957   BREAST CYST ASPIRATION     CATARACT EXTRACTION Right 09/08/2015   Dr. Hazle Quant   CHOLECYSTECTOMY  07/10/1986   CYSTOSCOPY N/A 07/16/2023   Procedure: CYSTOSCOPY with hydrodistention and intravesical botox injection (100 units);  Surgeon: Marguerita Beards, MD;  Location: [facility];  Service: Gynecology;  Laterality: N/A;   Hysteroscopic resection  04/10/1999   small fibroid polyps   LITHOTRIPSY     TONSILLECTOMY AND ADENOIDECTOMY  07/10/1944   TUBAL LIGATION  07/10/1977   BTL   Patient Active Problem List   Diagnosis Date Noted   Medication monitoring encounter 08/09/2022   Overactive bladder 10/18/2021   Recurrent UTI 10/18/2021   Depression 06/17/2016   Gastroesophageal reflux disease without esophagitis    Chest pain  at rest 06/16/2016   Cough 06/16/2016   Hyperglycemia 06/16/2016   Calculus of kidney 04/16/2013   Urge incontinence 04/16/2013   Urinary urgency 04/16/2013   Infection of urinary tract 04/16/2013    REFERRING PROVIDER:  Huel Cote, MD   REFERRING DIAG:  M53.3,G89.29 (ICD-10-CM) - Chronic left SI joint pain    Pelvic floor /SI joint Scapular dyskinesis program  Rationale for Evaluation and Treatment: Rehabilitation  THERAPY DIAG:  Chronic left shoulder pain  Chronic right shoulder pain  Abnormal posture  ONSET DATE: 3-4 wk back/SIJ; 6-7 mo for shoulder   SUBJECTIVE:  SUBJECTIVE STATEMENT: Feel much better since the dry  needling.   EVAL: SIJ pain probably 3-4 wk ago. When I am sleeping, my legs/knees hurt so bad and wakes me up. Was taking a seated yoga class and felt that exacerbated it.  Bucket list trip end of March for 19 days.  Shoulder pain on Left in last 6-7 months.   PERTINENT HISTORY:  See PMH  PAIN:  Are you having pain? Yes: NPRS scale: not really pain Pain location: SIJ Pain description: its just there Aggravating factors: constant Relieving factors: move around  PRECAUTIONS:  None  RED FLAGS: None   WEIGHT BEARING RESTRICTIONS:  No  FALLS:  Has patient fallen in last 6 months? No   OCCUPATION:  retired  PLOF:  Independent  PATIENT GOALS:  Decrease pain, get ready for trip   OBJECTIVE:  Note: Objective measures were completed at Evaluation unless otherwise noted.  DIAGNOSTIC FINDINGS:  Mild lumbar levoscoliosis seen in xray taken 08/10/23  PATIENT SURVEYS:  Modified Oswestry 15 (did not answer #8)  Quick Dash 45  COGNITIVE STATUS: Within functional limits for tasks assessed   SENSATION: WFL    POSTURE:  EVAL:   HAND DOMINANCE:   Right   Body Part #1 Lumbar   LOWER EXTREMITY MMT:    MMT (lb) Right eval Left eval  Hip flexion    Hip extension    Hip abduction (SL at knee) 15.9 6.4  Hip adduction    Hip internal rotation    Hip external rotation    Knee flexion 12.0 10.2  Knee extension 26.3 11.0   (Blank rows = not tested)                                                                                                                               TREATMENT DATE:   Treatment                            3/5: Blank lines following charge title = not provided on this treatment date.   Manual:  TPDN YES Trigger Point Dry Needling  Subsequent Treatment: Instructions provided previously at initial dry needling treatment.   Patient Verbal Consent Given: Yes Education Handout Provided: Previously Provided Muscles Treated: bilateral upper trap Electrical Stimulation Performed: No Treatment Response/Outcome: twitch with decreased concordant discomfort  There-ex: Seated thoracic extensions with deep breathing First rib mob with sheet Swiss ball ab set + roll up thighs, double & single arm Discussed options for continued endurance challenges while not feeling well There-Act:  Self Care:  Nuro-Re-ed:  Gait Training:    Treatment   09/08/23 -L Levator stretch 6 x 15 sec with 3 being with scapular depression manually by PT -Quadriped tilt with alternating UE reaches x 6 but discontinued due to increased pressure in bil glutes by pt request -supine knee to chest 2x30 sec -tilt with L oblique reach x  20 -tilt with ball roll up knees in neutral x 20 -tilt with ball roll alternating reaching for knees x 20 with head down -tilt with hip abduction/addct SLR x 15 -hamstring digs x 20 with 2-3 sec hold -tilt with reverse crunch x 20 -tilt with ball squeeze and LAQ x 15  -tilt with standing oblique reach x 15 each side -tilt with theraball lift into 90 degrees shoulder flexion     Treatment                             2/26: Blank lines following charge title = not provided on this treatment date.   Manual:  TPDN YES Trigger Point Dry Needling  Initial Treatment: Pt instructed on Dry Needling rational, procedures, and possible side effects. Pt instructed to expect mild to moderate muscle soreness later in the day and/or into the next day.  Pt instructed in methods to reduce muscle soreness. Pt instructed to continue prescribed HEP. Because Dry Needling was performed over or adjacent to a lung field, pt was educated on S/S of pneumothorax and to seek immediate medical attention should they occur.  Patient was educated on signs and symptoms of infection and other risk factors and advised to seek medical attention should they occur.  Patient verbalized understanding of these instructions and education.  Education time grouped into manual charges  Patient Verbal Consent Given: Yes Education Handout Provided: Yes Muscles Treated: Lt rhomboids, levator scap Electrical Stimulation Performed: No Treatment Response/Outcome: twitch with decreased concordant pain Prone rib mobs- Lt-sided cavitations There-ex: Scap retraction Shoulder rolls Door pec stretch ER red tband seated Upper trap & levator stretch L stretch There-Act:  Self Care:  Nuro-Re-ed:  Gait Training:    2/17: Seated posture Standing posture- ab set Seated hip hinge to stand, ball bw knees Heel raises ball bw ankles Standing gastroc stretch Seated hamstring stretch Forward step ups 4"- moved to staircase A/p weight shift with glut set- prog to walking around track  Treatment                            2/3:  Supine rib flare reduction with transverse set Lt sidelying oblique/Rt abd engagement Seated posture with ab set Standing posture- Rt foot back+glut set    PATIENT EDUCATION:  Education details: Anatomy of condition, POC, HEP, exercise form/rationale Person educated: Patient Education method:  Explanation, Demonstration, Tactile cues, Verbal cues, and Handouts Education comprehension: verbalized understanding, returned demonstration, verbal cues required, tactile cues required, and needs further education  HOME EXERCISE PROGRAM:  [email or web address]  Access Code: [email or web address]   ASSESSMENT:  CLINICAL IMPRESSION: Pt with respiratory illness resulting in overuse of cervical musculature for respiration and tension. Asked that she continue to move throughout her day while considering need for rest/healing.     REHAB POTENTIAL: Good  CLINICAL DECISION MAKING: Evolving/moderate complexity  EVALUATION COMPLEXITY: Low   GOALS: Goals reviewed with patient? Yes  SHORT TERM GOALS: Target date: 2/24  Able to demo controlled rib cage flare reduction in supine, sidelying and quadruped Baseline: Goal status: achieved  2.  Gross strength to increase by 10% Baseline:  Goal status: not tested today  3.  Able to complete exercises without  increase in concordant pain Baseline:  Goal status: achieved   LONG TERM GOALS: Target date:  POC date  Functional outcome scores to improve by MDC Baseline:  Goal status: INITIAL  2.  Rt to Lt strength measures 85% of opposite side Baseline:  Goal status: INITIAL  3.  Able to stand from chairs without use of UEs Baseline:  Goal status: INITIAL  4.  Pt will verbalize preparedness for her trip with understanding of HEP to continue Baseline:  Goal status: INITIAL    PLAN:  PT FREQUENCY: 1-2x/week  PT DURATION: 8 weeks  PLANNED INTERVENTIONS: 97164- PT Re-evaluation, 97110-Therapeutic exercises, 97530- Therapeutic activity, 97112- Neuromuscular re-education, 97535- Self Care, 57846- Manual therapy, [phone]- Gait training, [phone]- Aquatic Therapy, Patient/Family education, Balance training, Stair training, Taping, Dry Needling, Joint mobilization, Spinal mobilization, Cryotherapy, and Moist heat.  PLAN FOR NEXT SESSION:  continue core stability for Lt obliques/Rt abductors; qped core; gross quad/HS/gastroc strength with postural cues   Vanessa Trujillo PT, DPT 09/13/23 7:32 AM     Date of referral: 08/10/23 REFERRING PROVIDER:  Huel Cote, MD   REFERRING DIAG:  [phone] (ICD-10-CM) - Chronic left SI joint pain   Treatment diagnosis? (if different than referring diagnosis) M54.59, M25.512, M25.511, R29.3  What was this (referring dx) caused by? Ongoing Issue  Ashby Dawes of Condition: Chronic (continuous duration > 3 months)   Laterality: Both  Current Functional Measure Score: Modified Oswestry 15 (did not answer #8)  Quick Dash 45  Objective measurements identify impairments when they are compared to normal values, the uninvolved extremity, and prior level of function.  [x]  Yes  []  No  Objective assessment of functional ability: Moderate functional limitations   SIJ pain probably 3-4 wk ago. When I am sleeping, my legs/knees hurt so bad and wakes me up. Was taking a seated yoga class and felt that exacerbated it.  Bucket list trip end of March for 19 days.  Shoulder pain on Left in last 6-7 months.   Average pain intensity:  Last 24 hours: mod-severe  Past week: mod-severe  How often does the pt experience symptoms? Frequently  How much have the symptoms interfered with usual daily activities? Quite a bit  How has condition changed since care began at this facility? NA - initial visit  In general, how is the patients overall health? Very Good   BACK PAIN (STarT Back Screening Tool) No

## 2023-09-15 ENCOUNTER — Ambulatory Visit (HOSPITAL_BASED_OUTPATIENT_CLINIC_OR_DEPARTMENT_OTHER): Payer: Medicare Other | Admitting: Physical Therapy

## 2023-09-19 ENCOUNTER — Other Ambulatory Visit (HOSPITAL_BASED_OUTPATIENT_CLINIC_OR_DEPARTMENT_OTHER): Payer: Self-pay | Admitting: Orthopaedic Surgery

## 2023-09-19 ENCOUNTER — Encounter (HOSPITAL_BASED_OUTPATIENT_CLINIC_OR_DEPARTMENT_OTHER): Payer: Self-pay | Admitting: Physical Therapy

## 2023-09-19 ENCOUNTER — Other Ambulatory Visit (HOSPITAL_BASED_OUTPATIENT_CLINIC_OR_DEPARTMENT_OTHER): Payer: Self-pay

## 2023-09-19 ENCOUNTER — Ambulatory Visit (HOSPITAL_BASED_OUTPATIENT_CLINIC_OR_DEPARTMENT_OTHER): Admitting: Physical Therapy

## 2023-09-19 DIAGNOSIS — M25511 Pain in right shoulder: Secondary | ICD-10-CM | POA: Diagnosis not present

## 2023-09-19 DIAGNOSIS — G8929 Other chronic pain: Secondary | ICD-10-CM | POA: Diagnosis not present

## 2023-09-19 DIAGNOSIS — R293 Abnormal posture: Secondary | ICD-10-CM

## 2023-09-19 DIAGNOSIS — M5459 Other low back pain: Secondary | ICD-10-CM | POA: Diagnosis not present

## 2023-09-19 DIAGNOSIS — M25512 Pain in left shoulder: Secondary | ICD-10-CM | POA: Diagnosis not present

## 2023-09-19 MED ORDER — MELOXICAM 15 MG PO TABS
15.0000 mg | ORAL_TABLET | Freq: Every day | ORAL | 2 refills | Status: DC
  Filled 2023-09-19 – 2023-09-24 (×2): qty 30, 30d supply, fill #0

## 2023-09-19 NOTE — Therapy (Signed)
 OUTPATIENT PHYSICAL THERAPY TREATMENT  Patient Name: Vanessa Trujillo MRN: [id number] DOB:1940/08/28, 83 y.o., female Today's Date: 09/19/2023  END OF SESSION:  PT End of Session - 09/19/23 1041     Visit Number 6    Number of Visits 17    Date for PT Re-Evaluation 10/08/23    Authorization Type [employer] MCR    Progress Note Due on Visit 10    PT Start Time 1042    PT Stop Time 1105    PT Time Calculation (min) 23 min    Activity Tolerance Patient tolerated treatment well    Behavior During Therapy WFL for tasks assessed/performed                Past Medical History:  Diagnosis Date   Breast cyst    Aspirated   Chest pain    reaction to Codeine   Chronic UTI    Fibroid    GERD (gastroesophageal reflux disease)    Hematuria    Hepatitis    age 58, hospitalized   History of hiatal hernia    large per 06/05/23 CT A/P in Epic   History of kidney stones    Nonpuerperal mastitis 05/1990   OAB (overactive bladder)    Follows w/ [facility].   PONV (postoperative nausea and vomiting)    TMJ (sprain of temporomandibular joint)    around 2018   Past Surgical History:  Procedure Laterality Date   APPENDECTOMY  07/10/1957   BREAST CYST ASPIRATION     CATARACT EXTRACTION Right 09/08/2015   Dr. Hazle Quant   CHOLECYSTECTOMY  07/10/1986   CYSTOSCOPY N/A 07/16/2023   Procedure: CYSTOSCOPY with hydrodistention and intravesical botox injection (100 units);  Surgeon: Marguerita Beards, MD;  Location: [facility];  Service: Gynecology;  Laterality: N/A;   Hysteroscopic resection  04/10/1999   small fibroid polyps   LITHOTRIPSY     TONSILLECTOMY AND ADENOIDECTOMY  07/10/1944   TUBAL LIGATION  07/10/1977   BTL   Patient Active Problem List   Diagnosis Date Noted   Medication monitoring encounter 08/09/2022   Overactive bladder 10/18/2021   Recurrent UTI 10/18/2021   Depression 06/17/2016   Gastroesophageal reflux disease without esophagitis    Chest pain  at rest 06/16/2016   Cough 06/16/2016   Hyperglycemia 06/16/2016   Calculus of kidney 04/16/2013   Urge incontinence 04/16/2013   Urinary urgency 04/16/2013   Infection of urinary tract 04/16/2013    REFERRING PROVIDER:  Huel Cote, MD   REFERRING DIAG:  M53.3,G89.29 (ICD-10-CM) - Chronic left SI joint pain    Pelvic floor /SI joint Scapular dyskinesis program  Rationale for Evaluation and Treatment: Rehabilitation  THERAPY DIAG:  Chronic left shoulder pain  Chronic right shoulder pain  Abnormal posture  Other low back pain  ONSET DATE: 3-4 wk back/SIJ; 6-7 mo for shoulder   SUBJECTIVE:  SUBJECTIVE STATEMENT: 1 week until trip- not feeling well.   EVAL: SIJ pain probably 3-4 wk ago. When I am sleeping, my legs/knees hurt so bad and wakes me up. Was taking a seated yoga class and felt that exacerbated it.  Bucket list trip end of March for 19 days.  Shoulder pain on Left in last 6-7 months.   PERTINENT HISTORY:  See PMH  PAIN:  Are you having pain? Yes: NPRS scale: not really pain Pain location: SIJ Pain description: its just there Aggravating factors: constant Relieving factors: move around  PRECAUTIONS:  None  RED FLAGS: None   WEIGHT BEARING RESTRICTIONS:  No  FALLS:  Has patient fallen in last 6 months? No   OCCUPATION:  retired  PLOF:  Independent  PATIENT GOALS:  Decrease pain, get ready for trip   OBJECTIVE:  Note: Objective measures were completed at Evaluation unless otherwise noted.  DIAGNOSTIC FINDINGS:  Mild lumbar levoscoliosis seen in xray taken 08/10/23  PATIENT SURVEYS:  Modified Oswestry 15 (did not answer #8)  Quick Dash 45  COGNITIVE STATUS: Within functional limits for tasks assessed   SENSATION: WFL    POSTURE:  EVAL:    HAND DOMINANCE:  Right   Body Part #1 Lumbar   LOWER EXTREMITY MMT:    MMT (lb) Right eval Left eval  Hip flexion    Hip extension    Hip abduction (SL at knee) 15.9 6.4  Hip adduction    Hip internal rotation    Hip external rotation    Knee flexion 12.0 10.2  Knee extension 26.3 11.0   (Blank rows = not tested)                                                                                                                               TREATMENT DATE:   Treatment                            3/12: Blank lines following charge title = not provided on this treatment date.   Manual:  TPDN No  There-ex: Seated hamstring stretch Shoulder rolls Upper trap stretch Mod thomas stretch Open book There-Act:  Self Care:  Nuro-Re-ed:  Gait Training:   Treatment                            3/5: Blank lines following charge title = not provided on this treatment date.   Manual:  TPDN YES Trigger Point Dry Needling  Subsequent Treatment: Instructions provided previously at initial dry needling treatment.   Patient Verbal Consent Given: Yes Education Handout Provided: Previously Provided Muscles Treated: bilateral upper trap Electrical Stimulation Performed: No Treatment Response/Outcome: twitch with decreased concordant discomfort  There-ex: Seated thoracic extensions with deep breathing First rib mob with sheet Swiss ball ab set + roll up thighs, double & single arm Discussed options  for continued endurance challenges while not feeling well There-Act:  Self Care:  Nuro-Re-ed:  Gait Training:     PATIENT EDUCATION:  Education details: Anatomy of condition, POC, HEP, exercise form/rationale Person educated: Patient Education method: Explanation, Demonstration, Tactile cues, Verbal cues, and Handouts Education comprehension: verbalized understanding, returned demonstration, verbal cues required, tactile cues required, and needs further  education  HOME EXERCISE PROGRAM:  [email or web address]  Access Code: [email or web address]  Morning: [id number]    ASSESSMENT:  CLINICAL IMPRESSION: Created exercise program that she can do daily to get soreness/stiffness out in the AM and to do when she is travelling. Pt was unable to tolerate further treatment today due to not feeling well.     REHAB POTENTIAL: Good  CLINICAL DECISION MAKING: Evolving/moderate complexity  EVALUATION COMPLEXITY: Low   GOALS: Goals reviewed with patient? Yes  SHORT TERM GOALS: Target date: 2/24  Able to demo controlled rib cage flare reduction in supine, sidelying and quadruped Baseline: Goal status: achieved  2.  Gross strength to increase by 10% Baseline:  Goal status: not tested today  3.  Able to complete exercises without  increase in concordant pain Baseline:  Goal status: achieved   LONG TERM GOALS: Target date:  POC date  Functional outcome scores to improve by MDC Baseline:  Goal status: INITIAL  2.  Rt to Lt strength measures 85% of opposite side Baseline:  Goal status: INITIAL  3.  Able to stand from chairs without use of UEs Baseline:  Goal status: INITIAL  4.  Pt will verbalize preparedness for her trip with understanding of HEP to continue Baseline:  Goal status: INITIAL    PLAN:  PT FREQUENCY: 1-2x/week  PT DURATION: 8 weeks  PLANNED INTERVENTIONS: 97164- PT Re-evaluation, 97110-Therapeutic exercises, 97530- Therapeutic activity, 97112- Neuromuscular re-education, 97535- Self Care, 40981- Manual therapy, [phone]- Gait training, [phone]- Aquatic Therapy, Patient/Family education, Balance training, Stair training, Taping, Dry Needling, Joint mobilization, Spinal mobilization, Cryotherapy, and Moist heat.  PLAN FOR NEXT SESSION: continue core stability for Lt obliques/Rt abductors; qped core; gross quad/HS/gastroc strength with postural cues   Ayiden Milliman C. Jahmad Petrich PT, DPT 09/19/23 11:15 AM     Date of  referral: 08/10/23 REFERRING PROVIDER:  Huel Cote, MD   REFERRING DIAG:  [phone] (ICD-10-CM) - Chronic left SI joint pain   Treatment diagnosis? (if different than referring diagnosis) M54.59, M25.512, M25.511, R29.3  What was this (referring dx) caused by? Ongoing Issue  Ashby Dawes of Condition: Chronic (continuous duration > 3 months)   Laterality: Both  Current Functional Measure Score: Modified Oswestry 15 (did not answer #8)  Quick Dash 45  Objective measurements identify impairments when they are compared to normal values, the uninvolved extremity, and prior level of function.  [x]  Yes  []  No  Objective assessment of functional ability: Moderate functional limitations   SIJ pain probably 3-4 wk ago. When I am sleeping, my legs/knees hurt so bad and wakes me up. Was taking a seated yoga class and felt that exacerbated it.  Bucket list trip end of March for 19 days.  Shoulder pain on Left in last 6-7 months.   Average pain intensity:  Last 24 hours: mod-severe  Past week: mod-severe  How often does the pt experience symptoms? Frequently  How much have the symptoms interfered with usual daily activities? Quite a bit  How has condition changed since care began at this facility? NA - initial visit  In general, how is the patients overall health? Very Good   BACK PAIN (  STarT Back Screening Tool) No

## 2023-09-20 ENCOUNTER — Ambulatory Visit (HOSPITAL_BASED_OUTPATIENT_CLINIC_OR_DEPARTMENT_OTHER): Payer: Medicare Other | Admitting: Physical Therapy

## 2023-09-22 ENCOUNTER — Encounter (HOSPITAL_BASED_OUTPATIENT_CLINIC_OR_DEPARTMENT_OTHER): Payer: Self-pay

## 2023-09-22 ENCOUNTER — Other Ambulatory Visit (HOSPITAL_BASED_OUTPATIENT_CLINIC_OR_DEPARTMENT_OTHER): Payer: Self-pay

## 2023-09-22 ENCOUNTER — Ambulatory Visit (HOSPITAL_BASED_OUTPATIENT_CLINIC_OR_DEPARTMENT_OTHER): Payer: Medicare Other | Admitting: Physical Therapy

## 2023-09-24 ENCOUNTER — Encounter (HOSPITAL_BASED_OUTPATIENT_CLINIC_OR_DEPARTMENT_OTHER): Payer: Self-pay | Admitting: Physical Therapy

## 2023-09-24 ENCOUNTER — Other Ambulatory Visit (HOSPITAL_BASED_OUTPATIENT_CLINIC_OR_DEPARTMENT_OTHER): Payer: Self-pay

## 2023-09-24 ENCOUNTER — Ambulatory Visit (HOSPITAL_BASED_OUTPATIENT_CLINIC_OR_DEPARTMENT_OTHER): Payer: Medicare Other | Admitting: Physical Therapy

## 2023-09-24 DIAGNOSIS — G8929 Other chronic pain: Secondary | ICD-10-CM

## 2023-09-24 DIAGNOSIS — R293 Abnormal posture: Secondary | ICD-10-CM | POA: Diagnosis not present

## 2023-09-24 DIAGNOSIS — M5459 Other low back pain: Secondary | ICD-10-CM | POA: Diagnosis not present

## 2023-09-24 DIAGNOSIS — M25511 Pain in right shoulder: Secondary | ICD-10-CM | POA: Diagnosis not present

## 2023-09-24 DIAGNOSIS — M25512 Pain in left shoulder: Secondary | ICD-10-CM | POA: Diagnosis not present

## 2023-09-24 NOTE — Therapy (Signed)
 OUTPATIENT PHYSICAL THERAPY TREATMENT  Patient Name: Vanessa Trujillo MRN: [id number] DOB:04-09-1941, 83 y.o., female Today's Date: 09/24/2023  END OF SESSION:  PT End of Session - 09/24/23 1320     Visit Number 7    Number of Visits 17    Date for PT Re-Evaluation 10/08/23    Authorization Type UHC MCR    Progress Note Due on Visit 10    PT Start Time 1317    PT Stop Time 1357    PT Time Calculation (min) 40 min    Activity Tolerance Patient tolerated treatment well    Behavior During Therapy WFL for tasks assessed/performed                Past Medical History:  Diagnosis Date   Breast cyst    Aspirated   Chest pain    reaction to Codeine   Chronic UTI    Fibroid    GERD (gastroesophageal reflux disease)    Hematuria    Hepatitis    age 46, hospitalized   History of hiatal hernia    large per 06/05/23 CT A/P in Epic   History of kidney stones    Nonpuerperal mastitis 05/1990   OAB (overactive bladder)    Follows w/ [facility].   PONV (postoperative nausea and vomiting)    TMJ (sprain of temporomandibular joint)    around 2018   Past Surgical History:  Procedure Laterality Date   APPENDECTOMY  07/10/1957   BREAST CYST ASPIRATION     CATARACT EXTRACTION Right 09/08/2015   Dr. Hazle Quant   CHOLECYSTECTOMY  07/10/1986   CYSTOSCOPY N/A 07/16/2023   Procedure: CYSTOSCOPY with hydrodistention and intravesical botox injection (100 units);  Surgeon: Marguerita Beards, MD;  Location: [facility];  Service: Gynecology;  Laterality: N/A;   Hysteroscopic resection  04/10/1999   small fibroid polyps   LITHOTRIPSY     TONSILLECTOMY AND ADENOIDECTOMY  07/10/1944   TUBAL LIGATION  07/10/1977   BTL   Patient Active Problem List   Diagnosis Date Noted   Medication monitoring encounter 08/09/2022   Overactive bladder 10/18/2021   Recurrent UTI 10/18/2021   Depression 06/17/2016   Gastroesophageal reflux disease without esophagitis    Chest pain  at rest 06/16/2016   Cough 06/16/2016   Hyperglycemia 06/16/2016   Calculus of kidney 04/16/2013   Urge incontinence 04/16/2013   Urinary urgency 04/16/2013   Infection of urinary tract 04/16/2013    REFERRING PROVIDER:  Huel Cote, MD   REFERRING DIAG:  M53.3,G89.29 (ICD-10-CM) - Chronic left SI joint pain    Pelvic floor /SI joint Scapular dyskinesis program  Rationale for Evaluation and Treatment: Rehabilitation  THERAPY DIAG:  Chronic left shoulder pain  Chronic right shoulder pain  Abnormal posture  Other low back pain  ONSET DATE: 3-4 wk back/SIJ; 6-7 mo for shoulder   SUBJECTIVE:  SUBJECTIVE STATEMENT: Rt SIJ area is hurting, it has felt better as I have moved around.   EVAL: SIJ pain probably 3-4 wk ago. When I am sleeping, my legs/knees hurt so bad and wakes me up. Was taking a seated yoga class and felt that exacerbated it.  Bucket list trip end of March for 19 days.  Shoulder pain on Left in last 6-7 months.   PERTINENT HISTORY:  See PMH  PAIN:  Are you having pain? Yes: NPRS scale: not really pain Pain location: SIJ Pain description: its just there Aggravating factors: constant Relieving factors: move around  PRECAUTIONS:  None  RED FLAGS: None   WEIGHT BEARING RESTRICTIONS:  No  FALLS:  Has patient fallen in last 6 months? No   OCCUPATION:  retired  PLOF:  Independent  PATIENT GOALS:  Decrease pain, get ready for trip   OBJECTIVE:  Note: Objective measures were completed at Evaluation unless otherwise noted.  DIAGNOSTIC FINDINGS:  Mild lumbar levoscoliosis seen in xray taken 08/10/23  PATIENT SURVEYS:  Modified Oswestry 15 (did not answer #8)  Quick Dash 45  COGNITIVE STATUS: Within functional limits for tasks  assessed   SENSATION: WFL    POSTURE:  EVAL:   HAND DOMINANCE:  Right   Body Part #1 Lumbar   LOWER EXTREMITY MMT:    MMT (lb) Right eval Left eval  Hip flexion    Hip extension    Hip abduction (SL at knee) 15.9 6.4  Hip adduction    Hip internal rotation    Hip external rotation    Knee flexion 12.0 10.2  Knee extension 26.3 11.0   (Blank rows = not tested)                                                                                                                               TREATMENT DATE:   Treatment                            3/17: Blank lines following charge title = not provided on this treatment date.   Manual:  TPDN YES Trigger Point Dry Needling  Subsequent Treatment: Instructions provided previously at initial dry needling treatment.   Patient Verbal Consent Given: Yes Education Handout Provided: Previously Provided Muscles Treated: Rt glut med Electrical Stimulation Performed: No Treatment Response/Outcome: twitch with decreased concordant tightness, soreness as expected STM to glut med STM education with tennis ball  There-ex: LTR Piriformis stretch- supine & seated DKTC with rocking Bridge with towel roll bw knees Seated hamstring stretch Standing gastroc & hip flexor stretches.  Squat to tap table, glut set to stand Standing child pose- central & lateral each direction There-Act: Discussed plane exercises Self Care:  Nuro-Re-ed:  Gait Training:    Treatment  3/12: Blank lines following charge title = not provided on this treatment date.   Manual:  TPDN No  There-ex: Seated hamstring stretch Shoulder rolls Upper trap stretch Mod thomas stretch Open book There-Act:  Self Care:  Nuro-Re-ed:  Gait Training:   Treatment                            3/5: Blank lines following charge title = not provided on this treatment date.   Manual:  TPDN YES Trigger Point Dry  Needling  Subsequent Treatment: Instructions provided previously at initial dry needling treatment.   Patient Verbal Consent Given: Yes Education Handout Provided: Previously Provided Muscles Treated: bilateral upper trap Electrical Stimulation Performed: No Treatment Response/Outcome: twitch with decreased concordant discomfort  There-ex: Seated thoracic extensions with deep breathing First rib mob with sheet Swiss ball ab set + roll up thighs, double & single arm Discussed options for continued endurance challenges while not feeling well There-Act:  Self Care:  Nuro-Re-ed:  Gait Training:     PATIENT EDUCATION:  Education details: Teacher, music of condition, POC, HEP, exercise form/rationale Person educated: Patient Education method: Explanation, Demonstration, Tactile cues, Verbal cues, and Handouts Education comprehension: verbalized understanding, returned demonstration, verbal cues required, tactile cues required, and needs further education  HOME EXERCISE PROGRAM:  [email or web address]  Access Code: [email or web address]  Morning: [id number]    ASSESSMENT:  CLINICAL IMPRESSION: Pain consistent with trigger point in glut med- did not want to continue to finish piriformis. Is prepared for trip and encouraged her to be proactive about stretching to reduce soreness. Re-evaluation to be performed upon return.     REHAB POTENTIAL: Good  CLINICAL DECISION MAKING: Evolving/moderate complexity  EVALUATION COMPLEXITY: Low   GOALS: Goals reviewed with patient? Yes  SHORT TERM GOALS: Target date: 2/24  Able to demo controlled rib cage flare reduction in supine, sidelying and quadruped Baseline: Goal status: achieved  2.  Gross strength to increase by 10% Baseline:  Goal status: not tested today  3.  Able to complete exercises without  increase in concordant pain Baseline:  Goal status: achieved   LONG TERM GOALS: Target date:  POC date  Functional outcome scores to  improve by MDC Baseline:  Goal status: INITIAL  2.  Rt to Lt strength measures 85% of opposite side Baseline:  Goal status: INITIAL  3.  Able to stand from chairs without use of UEs Baseline:  Goal status: INITIAL  4.  Pt will verbalize preparedness for her trip with understanding of HEP to continue Baseline:  Goal status: INITIAL    PLAN:  PT FREQUENCY: 1-2x/week  PT DURATION: 8 weeks  PLANNED INTERVENTIONS: 97164- PT Re-evaluation, 97110-Therapeutic exercises, 97530- Therapeutic activity, 97112- Neuromuscular re-education, 97535- Self Care, 30160- Manual therapy, [phone]- Gait training, [phone]- Aquatic Therapy, Patient/Family education, Balance training, Stair training, Taping, Dry Needling, Joint mobilization, Spinal mobilization, Cryotherapy, and Moist heat.  PLAN FOR NEXT SESSION: continue core stability for Lt obliques/Rt abductors; qped core; gross quad/HS/gastroc strength with postural cues   Purvi Ruehl C. Jasaun Carn PT, DPT 09/24/23 2:00 PM     Date of referral: 08/10/23 REFERRING PROVIDER:  Huel Cote, MD   REFERRING DIAG:  [phone] (ICD-10-CM) - Chronic left SI joint pain   Treatment diagnosis? (if different than referring diagnosis) M54.59, M25.512, M25.511, R29.3  What was this (referring dx) caused by? Ongoing Issue  Ashby Dawes of Condition: Chronic (continuous duration > 3 months)   Laterality: Both  Current Functional Measure Score:  Modified Oswestry 15 (did not answer #8)  Quick Dash 45  Objective measurements identify impairments when they are compared to normal values, the uninvolved extremity, and prior level of function.  [x]  Yes  []  No  Objective assessment of functional ability: Moderate functional limitations   SIJ pain probably 3-4 wk ago. When I am sleeping, my legs/knees hurt so bad and wakes me up. Was taking a seated yoga class and felt that exacerbated it.  Bucket list trip end of March for 19 days.  Shoulder pain on Left in last  6-7 months.   Average pain intensity:  Last 24 hours: mod-severe  Past week: mod-severe  How often does the pt experience symptoms? Frequently  How much have the symptoms interfered with usual daily activities? Quite a bit  How has condition changed since care began at this facility? NA - initial visit  In general, how is the patients overall health? Very Good   BACK PAIN (STarT Back Screening Tool) No

## 2023-10-17 ENCOUNTER — Ambulatory Visit (HOSPITAL_BASED_OUTPATIENT_CLINIC_OR_DEPARTMENT_OTHER): Attending: Orthopaedic Surgery | Admitting: Physical Therapy

## 2023-10-17 ENCOUNTER — Encounter (HOSPITAL_BASED_OUTPATIENT_CLINIC_OR_DEPARTMENT_OTHER): Payer: Self-pay | Admitting: Physical Therapy

## 2023-10-17 DIAGNOSIS — G8929 Other chronic pain: Secondary | ICD-10-CM | POA: Diagnosis not present

## 2023-10-17 DIAGNOSIS — M5459 Other low back pain: Secondary | ICD-10-CM | POA: Diagnosis not present

## 2023-10-17 DIAGNOSIS — M25511 Pain in right shoulder: Secondary | ICD-10-CM | POA: Insufficient documentation

## 2023-10-17 DIAGNOSIS — R293 Abnormal posture: Secondary | ICD-10-CM | POA: Insufficient documentation

## 2023-10-17 DIAGNOSIS — M25512 Pain in left shoulder: Secondary | ICD-10-CM | POA: Diagnosis not present

## 2023-10-17 NOTE — Therapy (Unsigned)
 OUTPATIENT PHYSICAL THERAPY TREATMENT  Patient Name: Vanessa Trujillo MRN: [id number] DOB:Nov 18, 1940, 83 y.o., female Today's Date: 10/17/2023  END OF SESSION:  PT End of Session - 10/17/23 1223     Visit Number 8    Number of Visits 17    Authorization Type U[employer] MCR    Progress Note Due on Visit 18    PT Start Time 1222    PT Stop Time 1300    PT Time Calculation (min) 38 min    Activity Tolerance Patient tolerated treatment well    Behavior During Therapy WFL for tasks assessed/performed                Past Medical History:  Diagnosis Date   Breast cyst    Aspirated   Chest pain    reaction to Codeine   Chronic UTI    Fibroid    GERD (gastroesophageal reflux disease)    Hematuria    Hepatitis    age 83, hospitalized   History of hiatal hernia    large per 06/05/23 CT A/P in Epic   History of kidney stones    Nonpuerperal mastitis 05/1990   OAB (overactive bladder)    Follows w/ [facility].   PONV (postoperative nausea and vomiting)    TMJ (sprain of temporomandibular joint)    around 2018   Past Surgical History:  Procedure Laterality Date   APPENDECTOMY  07/10/1957   BREAST CYST ASPIRATION     CATARACT EXTRACTION Right 09/08/2015   Dr. Hazle Quant   CHOLECYSTECTOMY  07/10/1986   CYSTOSCOPY N/A 07/16/2023   Procedure: CYSTOSCOPY with hydrodistention and intravesical botox injection (100 units);  Surgeon: Marguerita Beards, MD;  Location: [facility];  Service: Gynecology;  Laterality: N/A;   Hysteroscopic resection  04/10/1999   small fibroid polyps   LITHOTRIPSY     TONSILLECTOMY AND ADENOIDECTOMY  07/10/1944   TUBAL LIGATION  07/10/1977   BTL   Patient Active Problem List   Diagnosis Date Noted   Medication monitoring encounter 08/09/2022   Overactive bladder 10/18/2021   Recurrent UTI 10/18/2021   Depression 06/17/2016   Gastroesophageal reflux disease without esophagitis    Chest pain at rest 06/16/2016   Cough 06/16/2016    Hyperglycemia 06/16/2016   Calculus of kidney 04/16/2013   Urge incontinence 04/16/2013   Urinary urgency 04/16/2013   Infection of urinary tract 04/16/2013    REFERRING PROVIDER:  Huel Cote, MD   REFERRING DIAG:  M53.3,G89.29 (ICD-10-CM) - Chronic left SI joint pain    Pelvic floor /SI joint Scapular dyskinesis program  Rationale for Evaluation and Treatment: Rehabilitation  THERAPY DIAG:  Chronic left shoulder pain  Chronic right shoulder pain  Abnormal posture  Other low back pain  ONSET DATE: 3-4 wk back/SIJ; 6-7 mo for shoulder   SUBJECTIVE:  SUBJECTIVE STATEMENT: Progress Note Reporting Period *** to ***  See note below for Objective Data and Assessment of Progress/Goals.      I had a great time but it was exhausting. I feel stiff, lower back and left shoulder hurt. I did take the meloxicam and continue to now.   EVAL: SIJ pain probably 3-4 wk ago. When I am sleeping, my legs/knees hurt so bad and wakes me up. Was taking a seated yoga class and felt that exacerbated it.  Bucket list trip end of March for 19 days.  Shoulder pain on Left in last 6-7 months.   PERTINENT HISTORY:  See PMH  PAIN:  Are you having pain? Yes: NPRS scale: not really pain Pain location: SIJ Pain description: its just there Aggravating factors: constant Relieving factors: move around  PRECAUTIONS:  None  RED FLAGS: None   WEIGHT BEARING RESTRICTIONS:  No  FALLS:  Has patient fallen in last 6 months? No   OCCUPATION:  retired  PLOF:  Independent  PATIENT GOALS:  Decrease pain, get ready for trip   OBJECTIVE:  Note: Objective measures were completed at Evaluation unless otherwise noted.  DIAGNOSTIC FINDINGS:  Mild lumbar levoscoliosis seen in xray taken  08/10/23  PATIENT SURVEYS:  Modified Oswestry 15 (did not answer #8)  Quick Dash 45  COGNITIVE STATUS: Within functional limits for tasks assessed   SENSATION: 4/9- lower back will start hurting and to lateral knee  POSTURE:  4/9- Rt shoulder depression as expected with spinal curve, good alignment through pelvis  HAND DOMINANCE:  Right   Body Part #1 Lumbar   LOWER EXTREMITY MMT:    MMT (lb) Right eval Left eval Rt/Lt 4/9  Hip flexion     Hip extension     Hip abduction (SL at knee) 15.9 6.4 12.5/11.1  Hip adduction     Hip internal rotation     Hip external rotation     Knee flexion 12.0 10.2 16.4/12.2  Knee extension 26.3 11.0 32.1/12.7   (Blank rows = not tested)                                                                                                                               TREATMENT DATE:  Treatment                            ***: Blank lines following charge title = not provided on this treatment date.   Manual:  {[id number]}  There-ex: Sit<>stand Side stepping Discussed return to exercise & changes to HEP There-Act:  Self Care: Discussion of healing after activity Nuro-Re-ed:  Gait Training:    Treatment                            3/17: Blank lines following charge title = not provided on this treatment date.   Manual:  TPDN YES Trigger Point Dry Needling  Subsequent Treatment: Instructions provided previously at initial dry needling treatment.   Patient Verbal Consent Given: Yes Education Handout Provided: Previously Provided Muscles Treated: Rt glut med Electrical Stimulation Performed: No Treatment Response/Outcome: twitch with decreased concordant tightness, soreness as expected STM to glut med STM education with tennis ball  There-ex: LTR Piriformis stretch- supine & seated DKTC with rocking Bridge with towel roll bw knees Seated hamstring stretch Standing gastroc & hip flexor stretches.  Squat to tap  table, glut set to stand Standing child pose- central & lateral each direction There-Act: Discussed plane exercises Self Care:  Nuro-Re-ed:  Gait Training:    Treatment                            3/12: Blank lines following charge title = not provided on this treatment date.   Manual:  TPDN No  There-ex: Seated hamstring stretch Shoulder rolls Upper trap stretch Mod thomas stretch Open book There-Act:  Self Care:  Nuro-Re-ed:  Gait Training:    PATIENT EDUCATION:  Education details: Anatomy of condition, POC, HEP, exercise form/rationale Person educated: Patient Education method: Explanation, Demonstration, Tactile cues, Verbal cues, and Handouts Education comprehension: verbalized understanding, returned demonstration, verbal cues required, tactile cues required, and needs further education  HOME EXERCISE PROGRAM:  [email or web address]  Access Code: [email or web address]  Morning: [id number]    ASSESSMENT:  CLINICAL IMPRESSION: ***    REHAB POTENTIAL: Good  CLINICAL DECISION MAKING: Evolving/moderate complexity  EVALUATION COMPLEXITY: Low   GOALS: Goals reviewed with patient? Yes  SHORT TERM GOALS: Target date: 2/24  Able to demo controlled rib cage flare reduction in supine, sidelying and quadruped Baseline: Goal status: achieved  2.  Gross strength to increase by 10% Baseline:  Goal status: not tested today  3.  Able to complete exercises without  increase in concordant pain Baseline:  Goal status: achieved   LONG TERM GOALS: Target date:  POC date  Functional outcome scores to improve by MDC Baseline:  Goal status: deferred to next visit 4/9  2.  Rt to Lt strength measures 85% of opposite side Baseline:  see obj Goal status: ongoing  3.  Able to stand from chairs without use of UEs Baseline: able to demo in clinic- will pay attention through her day Goal status: ongoing  4.  Pt will verbalize preparedness for her trip with  understanding of HEP to continue Baseline:  Goal status: achieved  5.  Able to walk longer without feeling so fatigued  Baseline:   Goal status: NEW     PLAN:  PT FREQUENCY: 1-2x/week  PT DURATION: 8 weeks  PLANNED INTERVENTIONS: 97164- PT Re-evaluation, 97110-Therapeutic exercises, 97530- Therapeutic activity, 97112- Neuromuscular re-education, 97535- Self Care, 16109- Manual therapy, [phone]- Gait training, [phone]- Aquatic Therapy, Patient/Family education, Balance training, Stair training, Taping, Dry Needling, Joint mobilization, Spinal mobilization, Cryotherapy, and Moist heat.  PLAN FOR NEXT SESSION: continue core stability for Lt obliques/Rt abductors; qped core; gross quad/HS/gastroc strength with postural cues   Dyani Babel C. Darelle Kings PT, DPT 10/17/23 1:39 PM     Date of referral: 08/10/23 REFERRING PROVIDER:  Huel Cote, MD   REFERRING DIAG:  [phone] (ICD-10-CM) - Chronic left SI joint pain   Treatment diagnosis? (if different than referring diagnosis) M54.59, M25.512, M25.511, R29.3  What was this (referring dx) caused by? Ongoing Issue  Ashby Dawes of Condition: Chronic (continuous duration > 3 months)   Laterality: Both  Current  Functional Measure Score: Modified Oswestry 15 (did not answer #8)  Quick Dash 45  Objective measurements identify impairments when they are compared to normal values, the uninvolved extremity, and prior level of function.  [x]  Yes  []  No  Objective assessment of functional ability: Moderate functional limitations   SIJ pain probably 3-4 wk ago. When I am sleeping, my legs/knees hurt so bad and wakes me up. Was taking a seated yoga class and felt that exacerbated it.  Bucket list trip end of March for 19 days.  Shoulder pain on Left in last 6-7 months.   Average pain intensity:  Last 24 hours: mod-severe  Past week: mod-severe  How often does the pt experience symptoms? Frequently  How much have the symptoms interfered  with usual daily activities? Quite a bit  How has condition changed since care began at this facility? NA - initial visit  In general, how is the patients overall health? Very Good   BACK PAIN (STarT Back Screening Tool) No

## 2023-10-20 ENCOUNTER — Other Ambulatory Visit (HOSPITAL_BASED_OUTPATIENT_CLINIC_OR_DEPARTMENT_OTHER): Payer: Self-pay

## 2023-10-22 ENCOUNTER — Other Ambulatory Visit: Payer: Self-pay

## 2023-10-22 ENCOUNTER — Other Ambulatory Visit (HOSPITAL_BASED_OUTPATIENT_CLINIC_OR_DEPARTMENT_OTHER): Payer: Self-pay

## 2023-10-22 MED ORDER — REPATHA SURECLICK 140 MG/ML ~~LOC~~ SOAJ
140.0000 mg | SUBCUTANEOUS | 3 refills | Status: AC
  Filled 2023-10-22: qty 6, 84d supply, fill #0
  Filled 2023-10-25: qty 2, 28d supply, fill #0
  Filled 2023-11-17 (×2): qty 2, 28d supply, fill #1
  Filled 2024-02-04: qty 2, 28d supply, fill #2
  Filled 2024-03-03: qty 2, 28d supply, fill #3
  Filled 2024-03-31: qty 2, 28d supply, fill #4
  Filled 2024-04-27: qty 2, 28d supply, fill #5
  Filled 2024-05-25: qty 2, 28d supply, fill #6
  Filled 2024-06-24: qty 2, 28d supply, fill #7
  Filled 2024-07-20: qty 2, 28d supply, fill #8
  Filled 2024-08-13: qty 2, 28d supply, fill #9

## 2023-10-24 ENCOUNTER — Other Ambulatory Visit (HOSPITAL_COMMUNITY)
Admission: RE | Admit: 2023-10-24 | Discharge: 2023-10-24 | Disposition: A | Discharge status: Home / Self Care | Source: Other Acute Inpatient Hospital | Attending: Obstetrics and Gynecology | Admitting: Obstetrics and Gynecology

## 2023-10-24 ENCOUNTER — Encounter: Payer: Self-pay | Admitting: Obstetrics and Gynecology

## 2023-10-24 ENCOUNTER — Ambulatory Visit: Admitting: Obstetrics and Gynecology

## 2023-10-24 VITALS — BP 168/83 | HR 76

## 2023-10-24 DIAGNOSIS — R319 Hematuria, unspecified: Secondary | ICD-10-CM

## 2023-10-24 DIAGNOSIS — R82998 Other abnormal findings in urine: Secondary | ICD-10-CM

## 2023-10-24 DIAGNOSIS — R350 Frequency of micturition: Secondary | ICD-10-CM

## 2023-10-24 DIAGNOSIS — N8110 Cystocele, unspecified: Secondary | ICD-10-CM | POA: Diagnosis not present

## 2023-10-24 LAB — URINALYSIS, ROUTINE W REFLEX MICROSCOPIC
Bilirubin Urine: NEGATIVE
Glucose, UA: NEGATIVE mg/dL
Hgb urine dipstick: NEGATIVE
Ketones, ur: NEGATIVE mg/dL
Nitrite: NEGATIVE
Protein, ur: NEGATIVE mg/dL
Specific Gravity, Urine: 1.012 (ref 1.005–1.030)
WBC, UA: >50 WBC/hpf (ref 0–5)
pH: 6 (ref 5.0–8.0)

## 2023-10-24 LAB — POCT URINALYSIS DIPSTICK
Bilirubin, UA: NEGATIVE
Glucose, UA: NEGATIVE
Ketones, UA: NEGATIVE
Nitrite, UA: NEGATIVE
Protein, UA: NEGATIVE
Spec Grav, UA: 1.015 (ref 1.010–1.025)
Urobilinogen, UA: 0.2 U/dL
pH, UA: 6 (ref 5.0–8.0)

## 2023-10-24 NOTE — Progress Notes (Signed)
 [facility] Return Visit  SUBJECTIVE  History of Present Illness: Vanessa Trujillo is a 83 y.o. female seen in follow-up for pelvic discomfort.   Went on a long trip with a lot of walking and noticed worsening of urethral spasm. Feels a lot of pressure and pushing feeling. Unsure if it is in the vagina. Has been using the vaginal estrogen cream twice a week. Does not really have burning with urination. Has also been using the compound vaginal cream as needed (amitriptyline/ gabapentin/ baclofen).   Past Medical History: Patient  has a past medical history of Breast cyst, Chest pain, Chronic UTI, Fibroid, GERD (gastroesophageal reflux disease), Hematuria, Hepatitis, History of hiatal hernia, History of kidney stones, Nonpuerperal mastitis (05/1990), OAB (overactive bladder), PONV (postoperative nausea and vomiting), and TMJ (sprain of temporomandibular joint).   Past Surgical History: She  has a past surgical history that includes Tonsillectomy and adenoidectomy (07/10/1944); Appendectomy (07/10/1957); Tubal ligation (07/10/1977); Cholecystectomy (07/10/1986); Hysteroscopic resection (04/10/1999); Breast cyst aspiration; Lithotripsy; Cataract extraction (Right, 09/08/2015); and Cystoscopy (N/A, 07/16/2023).   Medications: She has a current medication list which includes the following prescription(s): vitamin c, citalopram, estradiol, repatha sureclick, meloxicam, methenamine, methenamine, multiple vitamins-minerals, NONFORMULARY OR COMPOUNDED ITEM, omeprazole, omeprazole, align, repatha sureclick, and vitamin b-12.   Allergies: Patient is allergic to codeine, atorvastatin, pravastatin, rosuvastatin calcium, and zoster vac recomb adjuvanted.   Social History: Patient  reports that she has never smoked. She has never used smokeless tobacco. She reports that she does not drink alcohol and does not use drugs.     OBJECTIVE     Physical Exam: Vitals:   10/24/23 1030  BP: (!)  168/83  Pulse: 76   Gen: No apparent distress, A&O x 3.  Detailed Urogynecologic Evaluation:  Normal external genitalia. On speculum, normal vaginal mucosa and normal appearing cervix. On bimanual, uterus is small, mobile and nontender.    POP-Q  -1                                            Aa   -1                                           Ba  -6                                              C   2                                            Gh  3.5                                            Pb  7  tvl   -3                                            Ap  -3                                            Bp  -7                                              D    Results for orders placed or performed in visit on 10/24/23  POCT Urinalysis Dipstick   Collection Time: 10/24/23 10:40 AM  Result Value Ref Range   Color, UA Light Yellow    Clarity, UA Couldy    Glucose, UA Negative Negative   Bilirubin, UA Negative    Ketones, UA Negative    Spec Grav, UA 1.015 1.010 - 1.025   Blood, UA Small    pH, UA 6.0 5.0 - 8.0   Protein, UA Negative Negative   Urobilinogen, UA 0.2 0.2 or 1.0 E.U./dL   Nitrite, UA Negative    Leukocytes, UA Small (1+) (A) Negative   Appearance     Odor        ASSESSMENT AND PLAN    Vanessa Trujillo is a 83 y.o. with:  1. Prolapse of anterior vaginal wall   2. Leukocytes in urine   3. Urinary frequency   4. Hematuria, unspecified type    - Small anterior vaginal wall prolapse present which was not noted on prior exams. This could explain the pressure sensation she is having, especially since she was more active on her trip. We discussed options of expectant management, pelvic PT, pessary or surgery. Has done pelvic PT previously and she will incorporate some exercises.  - Will also send urine for culture as she has small leukocytes and UTI may account for some symptoms.   Arma Lamp, MD

## 2023-10-25 ENCOUNTER — Encounter (HOSPITAL_BASED_OUTPATIENT_CLINIC_OR_DEPARTMENT_OTHER): Payer: Self-pay | Admitting: Physical Therapy

## 2023-10-25 ENCOUNTER — Ambulatory Visit (HOSPITAL_BASED_OUTPATIENT_CLINIC_OR_DEPARTMENT_OTHER): Admitting: Physical Therapy

## 2023-10-25 ENCOUNTER — Other Ambulatory Visit (HOSPITAL_COMMUNITY): Payer: Self-pay | Admitting: Obstetrics

## 2023-10-25 ENCOUNTER — Other Ambulatory Visit (HOSPITAL_BASED_OUTPATIENT_CLINIC_OR_DEPARTMENT_OTHER): Payer: Self-pay

## 2023-10-25 DIAGNOSIS — M25512 Pain in left shoulder: Secondary | ICD-10-CM | POA: Diagnosis not present

## 2023-10-25 DIAGNOSIS — M5459 Other low back pain: Secondary | ICD-10-CM | POA: Diagnosis not present

## 2023-10-25 DIAGNOSIS — R293 Abnormal posture: Secondary | ICD-10-CM

## 2023-10-25 DIAGNOSIS — G8929 Other chronic pain: Secondary | ICD-10-CM | POA: Diagnosis not present

## 2023-10-25 DIAGNOSIS — N390 Urinary tract infection, site not specified: Secondary | ICD-10-CM

## 2023-10-25 DIAGNOSIS — M25511 Pain in right shoulder: Secondary | ICD-10-CM | POA: Diagnosis not present

## 2023-10-25 DIAGNOSIS — R82998 Other abnormal findings in urine: Secondary | ICD-10-CM

## 2023-10-25 MED ORDER — NITROFURANTOIN MONOHYD MACRO 100 MG PO CAPS
100.0000 mg | ORAL_CAPSULE | Freq: Two times a day (BID) | ORAL | 0 refills | Status: AC
  Filled 2023-10-25: qty 10, 5d supply, fill #0

## 2023-10-25 NOTE — Therapy (Signed)
 OUTPATIENT PHYSICAL THERAPY TREATMENT  Patient Name: Vanessa Trujillo MRN: [id number] DOB:03/02/1941, 83 y.o., female Today's Date: 10/25/2023  END OF SESSION:  PT End of Session - 10/25/23 1228     Visit Number 9    Number of Visits 30    Date for PT Re-Evaluation 02/02/24    Authorization Type UHC MCR    Progress Note Due on Visit 18    PT Start Time 1226    PT Stop Time 1307    PT Time Calculation (min) 41 min    Activity Tolerance Patient tolerated treatment well    Behavior During Therapy WFL for tasks assessed/performed                 Past Medical History:  Diagnosis Date   Breast cyst    Aspirated   Chest pain    reaction to Codeine   Chronic UTI    Fibroid    GERD (gastroesophageal reflux disease)    Hematuria    Hepatitis    age 19, hospitalized   History of hiatal hernia    large per 06/05/23 CT A/P in Epic   History of kidney stones    Nonpuerperal mastitis 05/1990   OAB (overactive bladder)    Follows w/ [facility].   PONV (postoperative nausea and vomiting)    TMJ (sprain of temporomandibular joint)    around 2018   Past Surgical History:  Procedure Laterality Date   APPENDECTOMY  07/10/1957   BREAST CYST ASPIRATION     CATARACT EXTRACTION Right 09/08/2015   Dr. Danley Dusky   CHOLECYSTECTOMY  07/10/1986   CYSTOSCOPY N/A 07/16/2023   Procedure: CYSTOSCOPY with hydrodistention and intravesical botox injection (100 units);  Surgeon: Arma Lamp, MD;  Location: [facility];  Service: Gynecology;  Laterality: N/A;   Hysteroscopic resection  04/10/1999   small fibroid polyps   LITHOTRIPSY     TONSILLECTOMY AND ADENOIDECTOMY  07/10/1944   TUBAL LIGATION  07/10/1977   BTL   Patient Active Problem List   Diagnosis Date Noted   Medication monitoring encounter 08/09/2022   Overactive bladder 10/18/2021   Recurrent UTI 10/18/2021   Depression 06/17/2016   Gastroesophageal reflux disease without esophagitis    Chest  pain at rest 06/16/2016   Cough 06/16/2016   Hyperglycemia 06/16/2016   Calculus of kidney 04/16/2013   Urge incontinence 04/16/2013   Urinary urgency 04/16/2013   Infection of urinary tract 04/16/2013    REFERRING PROVIDER:  Wilhelmenia Harada, MD   REFERRING DIAG:  M53.3,G89.29 (ICD-10-CM) - Chronic left SI joint pain    Pelvic floor /SI joint Scapular dyskinesis program  Rationale for Evaluation and Treatment: Rehabilitation  THERAPY DIAG:  Chronic left shoulder pain  Chronic right shoulder pain  Abnormal posture  ONSET DATE: 3-4 wk back/SIJ; 6-7 mo for shoulder   SUBJECTIVE:  SUBJECTIVE STATEMENT: Progressive worsening of Rt let at night- hard sleeping due to pain. Starts on her back, rolls to Lt side and feels pain- rubs lateral thigh to knee. Laying on right it is not quite as bad. Has not gone down to ankle. Pain gets better as I get up and walking around.   PERTINENT HISTORY:  See PMH  PAIN:  Are you having pain? Yes: NPRS scale: not really pain Pain location: Rt SIJ Pain description: isore Aggravating factors: constant Relieving factors: move around  PRECAUTIONS:  None  RED FLAGS: None   WEIGHT BEARING RESTRICTIONS:  No  FALLS:  Has patient fallen in last 6 months? No   OCCUPATION:  retired  PLOF:  Independent  PATIENT GOALS:  Decrease pain, get ready for trip   OBJECTIVE:  Note: Objective measures were completed at Evaluation unless otherwise noted.  DIAGNOSTIC FINDINGS:  Mild lumbar levoscoliosis seen in xray taken 08/10/23  PATIENT SURVEYS:  Modified Oswestry 15 (did not answer #8)  Quick Dash 45  COGNITIVE STATUS: Within functional limits for tasks assessed   SENSATION: 4/9- lower back will start hurting and to lateral knee  POSTURE:  4/9- Rt  shoulder depression as expected with spinal curve, good alignment through pelvis  HAND DOMINANCE:  Right   Body Part #1 Lumbar   LOWER EXTREMITY MMT:    MMT (lb) Right eval Left eval Rt/Lt 4/9  Hip flexion     Hip extension     Hip abduction (SL at knee) 15.9 6.4 12.5/11.1  Hip adduction     Hip internal rotation     Hip external rotation     Knee flexion 12.0 10.2 16.4/12.2  Knee extension 26.3 11.0 32.1/12.7   (Blank rows = not tested)                                                                                                                               TREATMENT DATE:  Treatment                            4/17: Blank lines following charge title = not provided on this treatment date.   Manual:  TPDN No Roller to Rt hip and lateral thigh Edu on self rolling There-ex: Supine HSS with strap Supine SKTC Thomas with strap Standing gastroc & ITB stretch- evening routine Bridges on heels Marching bridge There-Act:  Self Care: Anatomy of condition Nuro-Re-ed:  Gait Training:    Treatment                            10/17/23: Blank lines following charge title = not provided on this treatment date.   Manual:  TPDN No  There-ex: Sit<>stand Side stepping Discussed return to exercise & changes to HEP There-Act:  Self Care: Discussion of healing after activity Nuro-Re-ed:  Gait Training:   PATIENT  EDUCATION:  Education details: Teacher, music of condition, POC, HEP, exercise form/rationale Person educated: Patient Education method: Explanation, Demonstration, Tactile cues, Verbal cues, and Handouts Education comprehension: verbalized understanding, returned demonstration, verbal cues required, tactile cues required, and needs further education  HOME EXERCISE PROGRAM:  [email or web address]  Access Code: [email or web address]  Morning: [email or web address]   Evening: [id number]   ASSESSMENT:  CLINICAL IMPRESSION: S/s consistent with ITB irritation and reduced with  STM and stretching today. Significant weakness in glutes noted in bridges.     REHAB POTENTIAL: Good  CLINICAL DECISION MAKING: Evolving/moderate complexity  EVALUATION COMPLEXITY: Low   GOALS: Goals reviewed with patient? Yes  SHORT TERM GOALS: Target date: 2/24  Able to demo controlled rib cage flare reduction in supine, sidelying and quadruped Baseline: Goal status: achieved  2.  Gross strength to increase by 10% Baseline:  Goal status: not tested today  3.  Able to complete exercises without  increase in concordant pain Baseline:  Goal status: achieved   LONG TERM GOALS: Target date:  POC date  Functional outcome scores to improve by MDC Baseline:  Goal status: deferred to next visit 4/9  2.  Rt to Lt strength measures 85% of opposite side Baseline:  see obj Goal status: ongoing  3.  Able to stand from chairs without use of UEs Baseline: able to demo in clinic- will pay attention through her day Goal status: ongoing  4.  Pt will verbalize preparedness for her trip with understanding of HEP to continue Baseline:  Goal status: achieved  5.  Able to walk longer without feeling so fatigued  Baseline:   Goal status: NEW     PLAN:  PT FREQUENCY: 1-2x/week  PT DURATION: POC date  PLANNED INTERVENTIONS: 97164- PT Re-evaluation, 97110-Therapeutic exercises, 97530- Therapeutic activity, 97112- Neuromuscular re-education, 97535- Self Care, 16109- Manual therapy, [phone]- Gait training, [phone]- Aquatic Therapy, Patient/Family education, Balance training, Stair training, Taping, Dry Needling, Joint mobilization, Spinal mobilization, Cryotherapy, and Moist heat.  PLAN FOR NEXT SESSION: continue core stability for Lt obliques/Rt abductors; qped core; gross quad/HS/gastroc strength with postural cues   Talisa Petrak C. Aliviyah Malanga PT, DPT 10/25/23 1:10 PM     Date of referral: 08/10/23 REFERRING PROVIDER:  Huel Cote, MD   REFERRING DIAG:  [phone]  (ICD-10-CM) - Chronic left SI joint pain   Treatment diagnosis? (if different than referring diagnosis) M54.59, M25.512, M25.511, R29.3  What was this (referring dx) caused by? Ongoing Issue  Ashby Dawes of Condition: Chronic (continuous duration > 3 months)   Laterality: Both  Current Functional Measure Score: Modified Oswestry 15 (did not answer #8)  Quick Dash 45  Objective measurements identify impairments when they are compared to normal values, the uninvolved extremity, and prior level of function.  [x]  Yes  []  No  Objective assessment of functional ability: Moderate functional limitations   SIJ pain probably 3-4 wk ago. When I am sleeping, my legs/knees hurt so bad and wakes me up. Was taking a seated yoga class and felt that exacerbated it.  Bucket list trip end of March for 19 days.  Shoulder pain on Left in last 6-7 months.   Average pain intensity:  Last 24 hours: mod-severe  Past week: mod-severe  How often does the pt experience symptoms? Frequently  How much have the symptoms interfered with usual daily activities? Quite a bit  How has condition changed since care began at this facility? NA - initial visit  In general, how is the patients overall health? Very Good   BACK PAIN (  STarT Back Screening Tool) No

## 2023-10-26 LAB — URINE CULTURE: Culture: >=100000 — AB

## 2023-10-30 ENCOUNTER — Encounter (HOSPITAL_BASED_OUTPATIENT_CLINIC_OR_DEPARTMENT_OTHER): Payer: Self-pay | Admitting: Physical Therapy

## 2023-10-30 ENCOUNTER — Ambulatory Visit (HOSPITAL_BASED_OUTPATIENT_CLINIC_OR_DEPARTMENT_OTHER): Admitting: Physical Therapy

## 2023-10-30 DIAGNOSIS — G8929 Other chronic pain: Secondary | ICD-10-CM | POA: Diagnosis not present

## 2023-10-30 DIAGNOSIS — M25511 Pain in right shoulder: Secondary | ICD-10-CM | POA: Diagnosis not present

## 2023-10-30 DIAGNOSIS — R293 Abnormal posture: Secondary | ICD-10-CM

## 2023-10-30 DIAGNOSIS — M5459 Other low back pain: Secondary | ICD-10-CM | POA: Diagnosis not present

## 2023-10-30 DIAGNOSIS — M25512 Pain in left shoulder: Secondary | ICD-10-CM | POA: Diagnosis not present

## 2023-10-30 NOTE — Therapy (Signed)
 OUTPATIENT PHYSICAL THERAPY TREATMENT  Patient Name: Vanessa Trujillo MRN: [id number] DOB:02-Oct-1940, 83 y.o., female Today's Date: 10/30/2023  END OF SESSION:  PT End of Session - 10/30/23 0934     Visit Number 10    Number of Visits 30    Date for PT Re-Evaluation 02/02/24    Authorization Type [employer] MCR    Progress Note Due on Visit 18    PT Start Time 0932    PT Stop Time 1014    PT Time Calculation (min) 42 min    Activity Tolerance Patient tolerated treatment well    Behavior During Therapy WFL for tasks assessed/performed                  Past Medical History:  Diagnosis Date   Breast cyst    Aspirated   Chest pain    reaction to Codeine   Chronic UTI    Fibroid    GERD (gastroesophageal reflux disease)    Hematuria    Hepatitis    age 86, hospitalized   History of hiatal hernia    large per 06/05/23 CT A/P in Epic   History of kidney stones    Nonpuerperal mastitis 05/1990   OAB (overactive bladder)    Follows w/ [facility].   PONV (postoperative nausea and vomiting)    TMJ (sprain of temporomandibular joint)    around 2018   Past Surgical History:  Procedure Laterality Date   APPENDECTOMY  07/10/1957   BREAST CYST ASPIRATION     CATARACT EXTRACTION Right 09/08/2015   Dr. Danley Dusky   CHOLECYSTECTOMY  07/10/1986   CYSTOSCOPY N/A 07/16/2023   Procedure: CYSTOSCOPY with hydrodistention and intravesical botox  injection (100 units);  Surgeon: Arma Lamp, MD;  Location: [facility];  Service: Gynecology;  Laterality: N/A;   Hysteroscopic resection  04/10/1999   small fibroid polyps   LITHOTRIPSY     TONSILLECTOMY AND ADENOIDECTOMY  07/10/1944   TUBAL LIGATION  07/10/1977   BTL   Patient Active Problem List   Diagnosis Date Noted   Medication monitoring encounter 08/09/2022   Overactive bladder 10/18/2021   Recurrent UTI 10/18/2021   Depression 06/17/2016   Gastroesophageal reflux disease without esophagitis    Chest  pain at rest 06/16/2016   Cough 06/16/2016   Hyperglycemia 06/16/2016   Calculus of kidney 04/16/2013   Urge incontinence 04/16/2013   Urinary urgency 04/16/2013   Infection of urinary tract 04/16/2013    REFERRING PROVIDER:  Wilhelmenia Harada, MD   REFERRING DIAG:  M53.3,G89.29 (ICD-10-CM) - Chronic left SI joint pain    Pelvic floor /SI joint Scapular dyskinesis program  Rationale for Evaluation and Treatment: Rehabilitation  THERAPY DIAG:  Chronic left shoulder pain  Chronic right shoulder pain  Abnormal posture  Other low back pain  ONSET DATE: 3-4 wk back/SIJ; 6-7 mo for shoulder   SUBJECTIVE:  SUBJECTIVE STATEMENT: STM to Rt lateral thigh helped me be able to sleep. Still stiff in the AM.   PERTINENT HISTORY:  See PMH  PAIN:  Are you having pain? Yes: NPRS scale: not really pain Pain location: Rt SIJ Pain description: isore Aggravating factors: constant Relieving factors: move around  PRECAUTIONS:  None  RED FLAGS: None   WEIGHT BEARING RESTRICTIONS:  No  FALLS:  Has patient fallen in last 6 months? No   OCCUPATION:  retired  PLOF:  Independent  PATIENT GOALS:  Decrease pain, get ready for trip   OBJECTIVE:  Note: Objective measures were completed at Evaluation unless otherwise noted.  DIAGNOSTIC FINDINGS:  Mild lumbar levoscoliosis seen in xray taken 08/10/23  PATIENT SURVEYS:  Modified Oswestry 15 (did not answer #8)  Quick Dash 45  COGNITIVE STATUS: Within functional limits for tasks assessed   SENSATION: 4/9- lower back will start hurting and to lateral knee  POSTURE:  4/9- Rt shoulder depression as expected with spinal curve, good alignment through pelvis  HAND DOMINANCE:  Right   Body Part #1 Lumbar   LOWER EXTREMITY MMT:    MMT  (lb) Right eval Left eval Rt/Lt 4/9  Hip flexion     Hip extension     Hip abduction (SL at knee) 15.9 6.4 12.5/11.1  Hip adduction     Hip internal rotation     Hip external rotation     Knee flexion 12.0 10.2 16.4/12.2  Knee extension 26.3 11.0 32.1/12.7   (Blank rows = not tested)                                                                                                                               TREATMENT DATE:  Treatment                            4/22: Blank lines following charge title = not provided on this treatment date.   Manual:  TPDN No Roller and STM to Rt lateral hip, thigh, leg There-ex: Clam x20 Sidelying 90/90abd to hip height, knee extension maintaining hip flexion Sidelying abd/ext diagonal Supine piriformis stretch Bridge with marching- pillow bw knees DKTC  There-Act: Sit<>stand- rocking to glut set Seated hip hinge/reach Self Care:  Nuro-Re-ed:  Gait Training:    Treatment                            4/17: Blank lines following charge title = not provided on this treatment date.   Manual:  TPDN No Roller to Rt hip and lateral thigh Edu on self rolling There-ex: Supine HSS with strap Supine SKTC Thomas with strap Standing gastroc & ITB stretch- evening routine Bridges on heels Marching bridge There-Act:  Self Care: Anatomy of condition Nuro-Re-ed:  Investment banker, operational:     PATIENT EDUCATION:  Education details: Teacher, music of condition, POC, HEP,  exercise form/rationale Person educated: Patient Education method: Explanation, Demonstration, Tactile cues, Verbal cues, and Handouts Education comprehension: verbalized understanding, returned demonstration, verbal cues required, tactile cues required, and needs further education  HOME EXERCISE PROGRAM:  [email or web address]  Access Code: [email or web address]  Morning: [email or web address]   Evening: [email or web address]   ASSESSMENT:  CLINICAL IMPRESSION: Dependent on hands to stand until hip hinge  improved and was able to stand easily from a chair lower than std height. Less tension noted in leg than last visit but still sensitive to manual therapy.     REHAB POTENTIAL: Good  CLINICAL DECISION MAKING: Evolving/moderate complexity  EVALUATION COMPLEXITY: Low   GOALS: Goals reviewed with patient? Yes  SHORT TERM GOALS: Target date: 2/24  Able to demo controlled rib cage flare reduction in supine, sidelying and quadruped Baseline: Goal status: achieved  2.  Gross strength to increase by 10% Baseline:  Goal status: not tested today  3.  Able to complete exercises without  increase in concordant pain Baseline:  Goal status: achieved   LONG TERM GOALS: Target date:  POC date  Functional outcome scores to improve by MDC Baseline:  Goal status: deferred to next visit 4/9  2.  Rt to Lt strength measures 85% of opposite side Baseline:  see obj Goal status: ongoing  3.  Able to stand from chairs without use of UEs Baseline: able to demo in clinic- will pay attention through her day Goal status: ongoing  4.  Pt will verbalize preparedness for her trip with understanding of HEP to continue Baseline:  Goal status: achieved  5.  Able to walk longer without feeling so fatigued  Baseline:   Goal status: NEW     PLAN:  PT FREQUENCY: 1-2x/week  PT DURATION: POC date  PLANNED INTERVENTIONS: 97164- PT Re-evaluation, 97110-Therapeutic exercises, 97530- Therapeutic activity, 97112- Neuromuscular re-education, 97535- Self Care, 16109- Manual therapy, [phone]- Gait training, [phone]- Aquatic Therapy, Patient/Family education, Balance training, Stair training, Taping, Dry Needling, Joint mobilization, Spinal mobilization, Cryotherapy, and Moist heat.  PLAN FOR NEXT SESSION: continue core stability for Lt obliques/Rt abductors; qped core; gross quad/HS/gastroc strength with postural cues   Duc Crocket C. Jewett Mcgann PT, DPT 10/30/23 11:00 AM     Date of referral:  08/10/23 REFERRING PROVIDER:  Wilhelmenia Harada, MD   REFERRING DIAG:  [phone] (ICD-10-CM) - Chronic left SI joint pain   Treatment diagnosis? (if different than referring diagnosis) M54.59, M25.512, M25.511, R29.3  What was this (referring dx) caused by? Ongoing Issue  Lonne Roan of Condition: Chronic (continuous duration > 3 months)   Laterality: Both  Current Functional Measure Score: Modified Oswestry 15 (did not answer #8)  Quick Dash 45  Objective measurements identify impairments when they are compared to normal values, the uninvolved extremity, and prior level of function.  [x]  Yes  []  No  Objective assessment of functional ability: Moderate functional limitations   SIJ pain probably 3-4 wk ago. When I am sleeping, my legs/knees hurt so bad and wakes me up. Was taking a seated yoga class and felt that exacerbated it.  Bucket list trip end of March for 19 days.  Shoulder pain on Left in last 6-7 months.   Average pain intensity:  Last 24 hours: mod-severe  Past week: mod-severe  How often does the pt experience symptoms? Frequently  How much have the symptoms interfered with usual daily activities? Quite a bit  How has condition changed since care began at this facility? NA - initial visit  In general, how is the patients  overall health? Very Good   BACK PAIN (STarT Back Screening Tool) No

## 2023-11-06 ENCOUNTER — Ambulatory Visit (HOSPITAL_BASED_OUTPATIENT_CLINIC_OR_DEPARTMENT_OTHER): Admitting: Physical Therapy

## 2023-11-06 ENCOUNTER — Encounter (HOSPITAL_BASED_OUTPATIENT_CLINIC_OR_DEPARTMENT_OTHER): Payer: Self-pay | Admitting: Physical Therapy

## 2023-11-06 DIAGNOSIS — R293 Abnormal posture: Secondary | ICD-10-CM

## 2023-11-06 DIAGNOSIS — G8929 Other chronic pain: Secondary | ICD-10-CM

## 2023-11-06 DIAGNOSIS — M25511 Pain in right shoulder: Secondary | ICD-10-CM | POA: Diagnosis not present

## 2023-11-06 DIAGNOSIS — M5459 Other low back pain: Secondary | ICD-10-CM | POA: Diagnosis not present

## 2023-11-06 DIAGNOSIS — M25512 Pain in left shoulder: Secondary | ICD-10-CM | POA: Diagnosis not present

## 2023-11-06 NOTE — Therapy (Signed)
 OUTPATIENT PHYSICAL THERAPY TREATMENT  Patient Name: Vanessa Trujillo MRN: [id number] DOB:11-May-1941, 83 y.o., female Today's Date: 11/06/2023  END OF SESSION:  PT End of Session - 11/06/23 1149     Visit Number 11    Number of Visits 30    Date for PT Re-Evaluation 02/02/24    Authorization Type [employer] MCR    Progress Note Due on Visit 18    PT Start Time 1148    PT Stop Time 1229    PT Time Calculation (min) 41 min    Activity Tolerance Patient tolerated treatment well    Behavior During Therapy WFL for tasks assessed/performed                  Past Medical History:  Diagnosis Date   Breast cyst    Aspirated   Chest pain    reaction to Codeine   Chronic UTI    Fibroid    GERD (gastroesophageal reflux disease)    Hematuria    Hepatitis    age 38, hospitalized   History of hiatal hernia    large per 06/05/23 CT A/P in Epic   History of kidney stones    Nonpuerperal mastitis 05/1990   OAB (overactive bladder)    Follows w/ [facility].   PONV (postoperative nausea and vomiting)    TMJ (sprain of temporomandibular joint)    around 2018   Past Surgical History:  Procedure Laterality Date   APPENDECTOMY  07/10/1957   BREAST CYST ASPIRATION     CATARACT EXTRACTION Right 09/08/2015   Dr. Danley Dusky   CHOLECYSTECTOMY  07/10/1986   CYSTOSCOPY N/A 07/16/2023   Procedure: CYSTOSCOPY with hydrodistention and intravesical botox  injection (100 units);  Surgeon: Arma Lamp, MD;  Location: [facility];  Service: Gynecology;  Laterality: N/A;   Hysteroscopic resection  04/10/1999   small fibroid polyps   LITHOTRIPSY     TONSILLECTOMY AND ADENOIDECTOMY  07/10/1944   TUBAL LIGATION  07/10/1977   BTL   Patient Active Problem List   Diagnosis Date Noted   Medication monitoring encounter 08/09/2022   Overactive bladder 10/18/2021   Recurrent UTI 10/18/2021   Depression 06/17/2016   Gastroesophageal reflux disease without esophagitis    Chest  pain at rest 06/16/2016   Cough 06/16/2016   Hyperglycemia 06/16/2016   Calculus of kidney 04/16/2013   Urge incontinence 04/16/2013   Urinary urgency 04/16/2013   Infection of urinary tract 04/16/2013    REFERRING PROVIDER:  Wilhelmenia Harada, MD   REFERRING DIAG:  M53.3,G89.29 (ICD-10-CM) - Chronic left SI joint pain    Pelvic floor /SI joint Scapular dyskinesis program  Rationale for Evaluation and Treatment: Rehabilitation  THERAPY DIAG:  Chronic left shoulder pain  Chronic right shoulder pain  Abnormal posture  Other low back pain  ONSET DATE: 3-4 wk back/SIJ; 6-7 mo for shoulder   SUBJECTIVE:  SUBJECTIVE STATEMENT: What I am not getting relief from is across the lower back feeling like a band. Getting ready to do another surgery for incontinence- they stimulate the nerve.   PERTINENT HISTORY:  See PMH  PAIN:  Are you having pain? Yes: NPRS scale: not really pain Pain location: Rt SIJ Pain description: isore Aggravating factors: constant Relieving factors: move around  PRECAUTIONS:  None  RED FLAGS: None   WEIGHT BEARING RESTRICTIONS:  No  FALLS:  Has patient fallen in last 6 months? No   OCCUPATION:  retired  PLOF:  Independent  PATIENT GOALS:  Decrease pain, get ready for trip   OBJECTIVE:  Note: Objective measures were completed at Evaluation unless otherwise noted.  DIAGNOSTIC FINDINGS:  Mild lumbar levoscoliosis seen in xray taken 08/10/23  PATIENT SURVEYS:  Modified Oswestry 15 (did not answer #8)  Quick Dash 45  COGNITIVE STATUS: Within functional limits for tasks assessed   SENSATION: 4/9- lower back will start hurting and to lateral knee  POSTURE:  4/9- Rt shoulder depression as expected with spinal curve, good alignment through  pelvis  HAND DOMINANCE:  Right   Body Part #1 Lumbar   LOWER EXTREMITY MMT:    MMT (lb) Right eval Left eval Rt/Lt 4/9  Hip flexion     Hip extension     Hip abduction (SL at knee) 15.9 6.4 12.5/11.1  Hip adduction     Hip internal rotation     Hip external rotation     Knee flexion 12.0 10.2 16.4/12.2  Knee extension 26.3 11.0 32.1/12.7   (Blank rows = not tested)                                                                                                                               TREATMENT DATE:  Treatment                            4/29: Blank lines following charge title = not provided on this treatment date.   Manual:  TPDN YES Trigger Point Dry Needling  Subsequent Treatment: Instructions provided previously at initial dry needling treatment.   Patient Verbal Consent Given: Yes Education Handout Provided: Previously Provided Muscles Treated: bilateral piriformis, glut med, glut max Electrical Stimulation Performed: No Treatment Response/Outcome: twitch with decreased concordant tightness; soreness as expected.   There-ex:  There-Act:  Self Care: See plan Nuro-Re-ed:  Gait Training: '  Treatment                            4/22: Blank lines following charge title = not provided on this treatment date.   Manual:  TPDN No Roller and STM to Rt lateral hip, thigh, leg There-ex: Clam x20 Sidelying 90/90abd to hip height, knee extension maintaining hip flexion Sidelying abd/ext diagonal Supine piriformis stretch Bridge with marching- pillow bw knees DKTC  There-Act: Sit<>stand- rocking to glut set Seated hip hinge/reach Self Care:  Nuro-Re-ed:  Gait Training:    Treatment                            4/17: Blank lines following charge title = not provided on this treatment date.   Manual:  TPDN No Roller to Rt hip and lateral thigh Edu on self rolling There-ex: Supine HSS with strap Supine SKTC Thomas with strap Standing  gastroc & ITB stretch- evening routine Bridges on heels Marching bridge There-Act:  Self Care: Anatomy of condition Nuro-Re-ed:  Investment banker, operational:     PATIENT EDUCATION:  Education details: Teacher, music of condition, POC, HEP, exercise form/rationale Person educated: Patient Education method: Explanation, Demonstration, Tactile cues, Verbal cues, and Handouts Education comprehension: verbalized understanding, returned demonstration, verbal cues required, tactile cues required, and needs further education  HOME EXERCISE PROGRAM:  [email or web address]  Access Code: [email or web address]  Morning: [email or web address]   Evening: [email or web address]   ASSESSMENT:  CLINICAL IMPRESSION: Time taken today to discuss progress, changes and frustrations with pt. She is feeling less pain in leg and shoulder and more in lower back. S/s are consistent with MSK involvement rather than neuro or referred pain but will continue to monitor. Discussed biomechanical chain affects of scoliosis as well as sensations to be expected with progression of functional endurance. Pt reported feeling less discomfort and more encouraged upon ending session today.     REHAB POTENTIAL: Good  CLINICAL DECISION MAKING: Evolving/moderate complexity  EVALUATION COMPLEXITY: Low   GOALS: Goals reviewed with patient? Yes  SHORT TERM GOALS: Target date: 2/24  Able to demo controlled rib cage flare reduction in supine, sidelying and quadruped Baseline: Goal status: achieved  2.  Gross strength to increase by 10% Baseline:  Goal status: not tested today  3.  Able to complete exercises without  increase in concordant pain Baseline:  Goal status: achieved   LONG TERM GOALS: Target date:  POC date  Functional outcome scores to improve by MDC Baseline:  Goal status: deferred to next visit 4/9  2.  Rt to Lt strength measures 85% of opposite side Baseline:  see obj Goal status: ongoing  3.  Able to stand from chairs without use of  UEs Baseline: able to demo in clinic- will pay attention through her day Goal status: ongoing  4.  Pt will verbalize preparedness for her trip with understanding of HEP to continue Baseline:  Goal status: achieved  5.  Able to walk longer without feeling so fatigued  Baseline:   Goal status: NEW     PLAN:  PT FREQUENCY: 1-2x/week  PT DURATION: POC date  PLANNED INTERVENTIONS: 97164- PT Re-evaluation, 97110-Therapeutic exercises, 97530- Therapeutic activity, 97112- Neuromuscular re-education, 97535- Self Care, 28413- Manual therapy, [phone]- Gait training, [phone]- Aquatic Therapy, Patient/Family education, Balance training, Stair training, Taping, Dry Needling, Joint mobilization, Spinal mobilization, Cryotherapy, and Moist heat.  PLAN FOR NEXT SESSION: continue core stability for Lt obliques/Rt abductors; qped core; gross quad/HS/gastroc strength with postural cues   Adelfo Diebel C. Ikea Demicco PT, DPT 11/06/23 12:42 PM     Date of referral: 08/10/23 REFERRING PROVIDER:  Wilhelmenia Harada, MD   REFERRING DIAG:  [phone] (ICD-10-CM) - Chronic left SI joint pain   Treatment diagnosis? (if different than referring diagnosis) M54.59, M25.512, M25.511, R29.3  What was this (referring dx) caused by? Ongoing Issue  Lonne Roan of Condition: Chronic (continuous duration > 3 months)   Laterality: Both  Current Functional Measure Score: Modified Oswestry 15 (did not answer #8)  Quick Dash 45  Objective measurements identify impairments when they are compared to normal values, the uninvolved extremity, and prior level of function.  [x]  Yes  []  No  Objective assessment of functional ability: Moderate functional limitations   SIJ pain probably 3-4 wk ago. When I am sleeping, my legs/knees hurt so bad and wakes me up. Was taking a seated yoga class and felt that exacerbated it.  Bucket list trip end of March for 19 days.  Shoulder pain on Left in last 6-7 months.   Average pain  intensity:  Last 24 hours: mod-severe  Past week: mod-severe  How often does the pt experience symptoms? Frequently  How much have the symptoms interfered with usual daily activities? Quite a bit  How has condition changed since care began at this facility? NA - initial visit  In general, how is the patients overall health? Very Good   BACK PAIN (STarT Back Screening Tool) No

## 2023-11-07 ENCOUNTER — Encounter (HOSPITAL_BASED_OUTPATIENT_CLINIC_OR_DEPARTMENT_OTHER): Payer: Self-pay | Admitting: Physical Therapy

## 2023-11-12 ENCOUNTER — Ambulatory Visit (HOSPITAL_BASED_OUTPATIENT_CLINIC_OR_DEPARTMENT_OTHER): Attending: Orthopaedic Surgery | Admitting: Physical Therapy

## 2023-11-12 ENCOUNTER — Other Ambulatory Visit (HOSPITAL_COMMUNITY)
Admission: RE | Admit: 2023-11-12 | Discharge: 2023-11-12 | Disposition: A | Discharge status: Home / Self Care | Source: Other Acute Inpatient Hospital | Attending: Obstetrics and Gynecology | Admitting: Obstetrics and Gynecology

## 2023-11-12 ENCOUNTER — Ambulatory Visit (INDEPENDENT_AMBULATORY_CARE_PROVIDER_SITE_OTHER): Admitting: Obstetrics and Gynecology

## 2023-11-12 ENCOUNTER — Encounter (HOSPITAL_BASED_OUTPATIENT_CLINIC_OR_DEPARTMENT_OTHER): Payer: Self-pay | Admitting: Physical Therapy

## 2023-11-12 ENCOUNTER — Other Ambulatory Visit (HOSPITAL_BASED_OUTPATIENT_CLINIC_OR_DEPARTMENT_OTHER): Payer: Self-pay

## 2023-11-12 VITALS — BP 92/60 | HR 88

## 2023-11-12 DIAGNOSIS — N3281 Overactive bladder: Secondary | ICD-10-CM

## 2023-11-12 DIAGNOSIS — N8110 Cystocele, unspecified: Secondary | ICD-10-CM

## 2023-11-12 DIAGNOSIS — R82998 Other abnormal findings in urine: Secondary | ICD-10-CM | POA: Diagnosis not present

## 2023-11-12 DIAGNOSIS — G8929 Other chronic pain: Secondary | ICD-10-CM | POA: Diagnosis not present

## 2023-11-12 DIAGNOSIS — M5459 Other low back pain: Secondary | ICD-10-CM | POA: Insufficient documentation

## 2023-11-12 DIAGNOSIS — Z01818 Encounter for other preprocedural examination: Secondary | ICD-10-CM | POA: Diagnosis not present

## 2023-11-12 DIAGNOSIS — M25511 Pain in right shoulder: Secondary | ICD-10-CM | POA: Diagnosis not present

## 2023-11-12 DIAGNOSIS — M25512 Pain in left shoulder: Secondary | ICD-10-CM | POA: Diagnosis not present

## 2023-11-12 DIAGNOSIS — R293 Abnormal posture: Secondary | ICD-10-CM | POA: Diagnosis not present

## 2023-11-12 DIAGNOSIS — R350 Frequency of micturition: Secondary | ICD-10-CM

## 2023-11-12 DIAGNOSIS — R319 Hematuria, unspecified: Secondary | ICD-10-CM

## 2023-11-12 LAB — URINALYSIS, ROUTINE W REFLEX MICROSCOPIC
Bilirubin Urine: NEGATIVE
Glucose, UA: NEGATIVE mg/dL
Hgb urine dipstick: NEGATIVE
Ketones, ur: 5 mg/dL — AB
Nitrite: NEGATIVE
Protein, ur: 100 mg/dL — AB
Specific Gravity, Urine: 1.02 (ref 1.005–1.030)
WBC, UA: >50 WBC/hpf (ref 0–5)
pH: 5 (ref 5.0–8.0)

## 2023-11-12 LAB — POCT URINALYSIS DIPSTICK
Bilirubin, UA: NEGATIVE
Glucose, UA: NEGATIVE
Ketones, UA: NEGATIVE
Nitrite, UA: POSITIVE
Protein, UA: POSITIVE — AB
Spec Grav, UA: 1.025 (ref 1.010–1.025)
Urobilinogen, UA: 0.2 U/dL
pH, UA: 5.5 (ref 5.0–8.0)

## 2023-11-12 MED ORDER — NITROFURANTOIN MONOHYD MACRO 100 MG PO CAPS
100.0000 mg | ORAL_CAPSULE | Freq: Two times a day (BID) | ORAL | 0 refills | Status: AC
  Filled 2023-11-12: qty 10, 5d supply, fill #0

## 2023-11-12 NOTE — Progress Notes (Unsigned)
 [facility] Pre-Operative H&P  Subjective Chief Complaint: Vanessa Trujillo presents for a preoperative encounter.   History of Present Illness: Vanessa Trujillo is a 83 y.o. female who presents for preoperative visit.  She is scheduled to undergo *** on ***.  Her symptoms include ***, and she was was found to have Stage {Roman # I-V:19040} anterior, Stage {Roman # I-V:19040} posterior, Stage {Roman # I-V:19040} apical prolapse.   Urodynamics showed: ***  Past Medical History:  Diagnosis Date   Breast cyst    Aspirated   Chest pain    reaction to Codeine   Chronic UTI    Fibroid    GERD (gastroesophageal reflux disease)    Hematuria    Hepatitis    age 37, hospitalized   History of hiatal hernia    large per 06/05/23 CT A/P in Epic   History of kidney stones    Nonpuerperal mastitis 05/1990   OAB (overactive bladder)    Follows w/ [facility].   PONV (postoperative nausea and vomiting)    TMJ (sprain of temporomandibular joint)    around 2018     Past Surgical History:  Procedure Laterality Date   APPENDECTOMY  07/10/1957   BREAST CYST ASPIRATION     CATARACT EXTRACTION Right 09/08/2015   Dr. Danley Dusky   CHOLECYSTECTOMY  07/10/1986   CYSTOSCOPY N/A 07/16/2023   Procedure: CYSTOSCOPY with hydrodistention and intravesical botox  injection (100 units);  Surgeon: Arma Lamp, MD;  Location: [facility];  Service: Gynecology;  Laterality: N/A;   Hysteroscopic resection  04/10/1999   small fibroid polyps   LITHOTRIPSY     TONSILLECTOMY AND ADENOIDECTOMY  07/10/1944   TUBAL LIGATION  07/10/1977   BTL    is allergic to codeine, atorvastatin, pravastatin, rosuvastatin calcium, and zoster vac recomb adjuvanted.   Family History  Problem Relation Age of Onset   Breast cancer Mother 33   Heart failure Father    Emphysema Father        Died age 14   Breast cancer Maternal Grandmother 34    Social History   Tobacco Use    Smoking status: Never   Smokeless tobacco: Never  Vaping Use   Vaping status: Never Used  Substance Use Topics   Alcohol use: No   Drug use: No     Review of Systems was negative for a full 10 system review except as noted in the History of Present Illness.   Current Outpatient Medications:    Ascorbic Acid (VITAMIN C ) 1000 MG tablet, Take 1 tablet (1,000 mg total) by mouth in the morning, at noon, in the evening, and at bedtime. (Patient taking differently: Take 1,000 mg by mouth in the morning and at bedtime.), Disp: 120 tablet, Rfl: 2   citalopram  (CELEXA ) 20 MG tablet, Take 1 tablet (20 mg total) by mouth daily., Disp: 90 tablet, Rfl: 3   Estradiol  10 MCG TABS vaginal tablet, PLACE 1 TABLET VAGINALLY 2 TIMES A WEEK., Disp: 24 tablet, Rfl: 3   Evolocumab  (REPATHA  SURECLICK) 140 MG/ML SOAJ, Inject 140 mg into the skin every 14 (fourteen) days., Disp: 6 mL, Rfl: 3   Multiple Vitamins-Minerals (PRESERVISION AREDS PO), Take by mouth., Disp: , Rfl:    NONFORMULARY OR COMPOUNDED ITEM, Amitriptyline 2.5%/ gabapentin 2.5%/ baclofen 2.5% in vaginal cream.  Place 1g daily in vaginal/ vulvar area.  Dispense 60g with 5 refills., Disp: 60 each, Rfl: 5   omeprazole  (PRILOSEC) 10 MG capsule, Take 1 capsule (10  mg total) by mouth daily., Disp: 360 capsule, Rfl: 0   omeprazole  (PRILOSEC) 10 MG capsule, Take 1 capsule (10 mg total) by mouth daily., Disp: 90 capsule, Rfl: 4   Probiotic Product (ALIGN) 4 MG CAPS, , Disp: , Rfl:    vitamin B-12 (CYANOCOBALAMIN ) 100 MCG tablet, Take 100 mcg by mouth daily., Disp: , Rfl:    meloxicam  (MOBIC ) 15 MG tablet, Take 1 tablet (15 mg total) by mouth daily., Disp: 30 tablet, Rfl: 2   REPATHA  SURECLICK 140 MG/ML SOAJ, Inject 1 mL into the skin every 14 (fourteen) days., Disp: , Rfl:    Objective Vitals:   11/12/23 1338  BP: 92/60  Pulse: 88    Gen: NAD CV: S1 S2 RRR Lungs: Clear to auscultation bilaterally Abd: soft, nontender   Previous Pelvic Exam  showed: ***    Assessment/ Plan  Assessment: The patient is a 83 y.o. year old scheduled to undergo ***. {desc; verbal/written:16408} consent was obtained for these procedures.  Plan: General Surgical Consent: The patient has previously been counseled on alternative treatments, and the decision by the patient and provider was to proceed with the procedure listed above.  For all procedures, there are risks of bleeding, infection, damage to surrounding organs including but not limited to bowel, bladder, blood vessels, ureters and nerves, and need for further surgery if an injury were to occur. These risks are all low with minimally invasive surgery.   There are risks of numbness and weakness at any body site or buttock/rectal pain.  It is possible that baseline pain can be worsened by surgery, either with or without mesh. If surgery is vaginal, there is also a low risk of possible conversion to laparoscopy or open abdominal incision where indicated. Very rare risks include blood transfusion, blood clot, heart attack, pneumonia, or death.   There is also a risk of short-term postoperative urinary retention with need to use a catheter. About half of patients need to go home from surgery with a catheter, which is then later removed in the office. The risk of long-term need for a catheter is very low. There is also a risk of worsening of overactive bladder.   ***Sling: The effectiveness of a midurethral vaginal mesh sling is approximately 85%, and thus, there will be times when you may leak urine after surgery, especially if your bladder is full or if you have a strong cough. There is a balance between making the sling tight enough to treat your leakage but not too tight so that you have long-term difficulty emptying your bladder. A mesh sling will not directly treat overactive bladder/urge incontinence and may worsen it.  There is an FDA safety notification on vaginal mesh procedures for prolapse but  NOT mesh slings. We have extensive experience and training with mesh placement and we have close postoperative follow up to identify any potential complications from mesh. It is important to realize that this mesh is a permanent implant that cannot be easily removed. There are rare risks of mesh exposure (2-4%), pain with intercourse (0-7%), and infection (<1%). The risk of mesh exposure if more likely in a woman with risks for poor healing (prior radiation, poorly controlled diabetes, or immunocompromised). The risk of new or worsened chronic pain after mesh implant is more common in women with baseline chronic pain and/or poorly controlled anxiety or depression. Approximately 2-4% of patients will experience longer-term post-operative voiding dysfunction that may require surgical revision of the sling. We also reviewed that postoperatively, her  stream may not be as strong as before surgery.   *** Prolapse (with or without mesh): Risk factors for surgical failure  include things that put pressure on your pelvis and the surgical repair, including obesity, chronic cough, and heavy lifting or straining (including lifting children or adults, straining on the toilet, or lifting heavy objects such as furniture or anything weighing >25 lbs. Risks of recurrence is 20-30% with vaginal native tissue repair and a less than 10% with sacrocolpopexy with mesh.    ***Sacrocolpopexy: Mesh implants may provide more prolapse support, but do have some unique risks to consider. It is important to understand that mesh is permanent and cannot be easily removed. Risks of abdominal sacrocolpopexy mesh include mesh exposure (~3-6%), painful intercourse (recent studies show lower rates after surgery compared to before, with ~5-8% risk of new onset), and very rare risks of bowel or bladder injury or infection (<1%). The risk of mesh exposure is more likely in a woman with risks for poor healing (prior radiation, poorly controlled  diabetes, or immunocompromised). The risk of new or worsened chronic pain after mesh implant is more common in women with baseline chronic pain and/or poorly controlled anxiety or depression. There is an FDA safety notification on vaginal mesh procedures for prolapse but NOT abdominal mesh procedures and therefore does not apply to your surgery. We have extensive experience and training with mesh placement and we have close postoperative follow up to identify any potential complications from mesh.    We discussed consent for blood products. Risks for blood transfusion include allergic reactions, other reactions that can affect different body organs and managed accordingly, transmission of infectious diseases such as HIV or Hepatitis. However, the blood is screened. Patient consents for blood products.***  Pre-operative instructions:  She was instructed to not take Aspirin /NSAIDs x 7days prior to surgery. She may continue her 81mg  ASA.*** Antibiotic prophylaxis was ordered as indicated.  Catheter use: Patient will go home with foley if needed after post-operative voiding trial.  Post-operative instructions:  She was provided with specific post-operative instructions, including precautions and signs/symptoms for which we would recommend contacting us , in addition to daytime and after-hours contact phone numbers. This was provided on a handout.   Post-operative medications: ***Prescriptions for motrin, tylenol , miralax, and oxycodone  were sent to her pharmacy. Discussed using ibuprofen and tylenol  on a schedule to limit use of narcotics.   Laboratory testing:  We will check labs: ***. Day of surgery UPT***  Preoperative clearance:  She {does/does not:19886} require surgical clearance.    Post-operative follow-up:  A post-operative appointment will be made for 6 weeks from the date of surgery. If she needs a post-operative nurse visit for a voiding trial, that will be set up after she leaves the  hospital.    Patient will call the clinic or use MyChart should anything change or any new issues arise.   Margorie Renner G Tenessa Marsee, NP   ***OR orders

## 2023-11-12 NOTE — Therapy (Signed)
 OUTPATIENT PHYSICAL THERAPY TREATMENT  Patient Name: Vanessa Trujillo MRN: [id number] DOB:1941-02-25, 83 y.o., female Today's Date: 11/12/2023  END OF SESSION:  PT End of Session - 11/12/23 1103     Visit Number 12    Number of Visits 30    Date for PT Re-Evaluation 02/02/24    Authorization Type U[employer] MCR    Progress Note Due on Visit 18    PT Start Time 1103    PT Stop Time 1145    PT Time Calculation (min) 42 min    Activity Tolerance Patient tolerated treatment well    Behavior During Therapy WFL for tasks assessed/performed                  Past Medical History:  Diagnosis Date   Breast cyst    Aspirated   Chest pain    reaction to Codeine   Chronic UTI    Fibroid    GERD (gastroesophageal reflux disease)    Hematuria    Hepatitis    age 24, hospitalized   History of hiatal hernia    large per 06/05/23 CT A/P in Epic   History of kidney stones    Nonpuerperal mastitis 05/1990   OAB (overactive bladder)    Follows w/ [facility].   PONV (postoperative nausea and vomiting)    TMJ (sprain of temporomandibular joint)    around 2018   Past Surgical History:  Procedure Laterality Date   APPENDECTOMY  07/10/1957   BREAST CYST ASPIRATION     CATARACT EXTRACTION Right 09/08/2015   Dr. Danley Dusky   CHOLECYSTECTOMY  07/10/1986   CYSTOSCOPY N/A 07/16/2023   Procedure: CYSTOSCOPY with hydrodistention and intravesical botox  injection (100 units);  Surgeon: Arma Lamp, MD;  Location: [facility];  Service: Gynecology;  Laterality: N/A;   Hysteroscopic resection  04/10/1999   small fibroid polyps   LITHOTRIPSY     TONSILLECTOMY AND ADENOIDECTOMY  07/10/1944   TUBAL LIGATION  07/10/1977   BTL   Patient Active Problem List   Diagnosis Date Noted   Medication monitoring encounter 08/09/2022   Overactive bladder 10/18/2021   Recurrent UTI 10/18/2021   Depression 06/17/2016   Gastroesophageal reflux disease without esophagitis    Chest  pain at rest 06/16/2016   Cough 06/16/2016   Hyperglycemia 06/16/2016   Calculus of kidney 04/16/2013   Urge incontinence 04/16/2013   Urinary urgency 04/16/2013   Infection of urinary tract 04/16/2013    REFERRING PROVIDER:  Wilhelmenia Harada, MD   REFERRING DIAG:  M53.3,G89.29 (ICD-10-CM) - Chronic left SI joint pain    Pelvic floor /SI joint Scapular dyskinesis program  Rationale for Evaluation and Treatment: Rehabilitation  THERAPY DIAG:  Chronic left shoulder pain  Chronic right shoulder pain  Abnormal posture  Other low back pain  ONSET DATE: 3-4 wk back/SIJ; 6-7 mo for shoulder   SUBJECTIVE:  SUBJECTIVE STATEMENT: I had a really good day and the next day was harder so I got down. I am more consistent in morning stretches. Improvements in sleeping without RLE pain. Still feel like the right leg is not going to hold me up to lead with a step up.  Strength for walking feels better- doing [place] 4-5 times/week depending on weather.  I can now get up and down from chairs without pushing with my hands!   PERTINENT HISTORY:  See PMH  PAIN:  Are you having pain? Yes: NPRS scale: not really pain Pain location: Rt SIJ Pain description: unstable Aggravating factors: constant Relieving factors: move around  PRECAUTIONS:  None  RED FLAGS: None   WEIGHT BEARING RESTRICTIONS:  No  FALLS:  Has patient fallen in last 6 months? No   OCCUPATION:  retired  PLOF:  Independent  PATIENT GOALS:  Decrease pain, get ready for trip   OBJECTIVE:  Note: Objective measures were completed at Evaluation unless otherwise noted.  DIAGNOSTIC FINDINGS:  Mild lumbar levoscoliosis seen in xray taken 08/10/23  PATIENT SURVEYS:  Modified Oswestry 15 (did  not answer #8)  Quick Dash 45 5/5:     Mod Oswestry 13 (did not answer #8)  (MDC is 10%)     Quick Dash 40.91  (MDC is 11%)  COGNITIVE STATUS: Within functional limits for tasks assessed   SENSATION: 4/9- lower back will start hurting and to lateral knee 5/5: occasional pain to RLE at night but has improved   POSTURE:  4/9- Rt shoulder depression as expected with spinal curve, good alignment through pelvis  HAND DOMINANCE:  Right   Body Part #1 Lumbar   LOWER EXTREMITY MMT:    MMT (lb) Right eval Left eval Rt/Lt 4/9 Rt/Lt 5/5  Hip flexion      Hip extension      Hip abduction (SL at knee) 15.9 6.4 12.5/11.1 14.9/21.2  Hip adduction      Hip internal rotation      Hip external rotation      Knee flexion 12.0 10.2 16.4/12.2 23.9/20.5  Knee extension 26.3 11.0 32.1/12.7 29.2/21.4   (Blank rows = not tested)                                                                                                                               TREATMENT DATE:  Treatment                            5/5: Blank lines following charge title = not provided on this treatment date.   Manual:  TPDN No  There-ex:  There-Act: Testing & functional outcome surveys Discussed pelvic floor rehab  & POC Self Care:  Nuro-Re-ed:  Gait Training:     PATIENT EDUCATION:  Education details: Anatomy of condition, POC, HEP, exercise form/rationale Person educated: Patient Education method: Explanation,  Demonstration, Tactile cues, Verbal cues, and Handouts Education comprehension: verbalized understanding, returned demonstration, verbal cues required, tactile cues required, and needs further education  HOME EXERCISE PROGRAM:  [email or web address]  Access Code: [email or web address]  Morning: [id number]   Evening: [id number]   ASSESSMENT:  CLINICAL IMPRESSION: Suspect that lack of LLE strength is resulting in lack of pelvic stability when RLE is trying to create power in motions such as  climbing stairs.  Pt has demonstrated improvements in strength measures as well as strength that is more balanced right to left which improves midline stability. Is having stimulator implanted for bladder control soon. Pt will benefit from continued skilled PT to progress and adjust HEP for strength, stability, balance and endurance challenge without return to levels of pain when she began PT.     REHAB POTENTIAL: Good  CLINICAL DECISION MAKING: Evolving/moderate complexity  EVALUATION COMPLEXITY: Low   GOALS: Goals reviewed with patient? Yes  SHORT TERM GOALS: Target date: 2/24  Able to demo controlled rib cage flare reduction in supine, sidelying and quadruped Baseline: Goal status: achieved  2.  Gross strength to increase by 10% Baseline:  Goal status: not tested today  3.  Able to complete exercises without  increase in concordant pain Baseline:  Goal status: achieved   LONG TERM GOALS: Target date:  POC date  Functional outcome scores to improve by MDC Baseline:  Goal status: deferred to next visit 4/9  2.  Rt to Lt strength measures 85% of opposite side Baseline:  see obj Goal status: ongoing  3.  Able to stand from chairs without use of UEs Baseline: able to demo in clinic- will pay attention through her day Goal status: achieved  4.  Pt will verbalize preparedness for her trip with understanding of HEP to continue Baseline:  Goal status: achieved  5.  Able to walk 2 miles without pain or rest break  Baseline: improved to just over a mile at [employer] 4-5 times/week  Goal status: ongoing     PLAN:  PT FREQUENCY: 1-2x/week  PT DURATION: POC date  PLANNED INTERVENTIONS: 97164- PT Re-evaluation, 97110-Therapeutic exercises, 97530- Therapeutic activity, 97112- Neuromuscular re-education, 97535- Self Care, 09323- Manual therapy, [phone]- Gait training, [phone]- Aquatic Therapy, Patient/Family education, Balance training, Stair training, Taping, Dry  Needling, Joint mobilization, Spinal mobilization, Cryotherapy, and Moist heat.  PLAN FOR NEXT SESSION: continue core stability for Lt obliques/Rt abductors; qped core; gross quad/HS/gastroc strength with postural cues   Kahleb Mcclane C. Sharnese Heath PT, DPT 11/12/23 12:32 PM     Date of referral: 08/10/23 REFERRING PROVIDER:  Wilhelmenia Harada, MD   REFERRING DIAG:  [phone] (ICD-10-CM) - Chronic left SI joint pain   Treatment diagnosis? (if different than referring diagnosis) M54.59, M25.512, M25.511, R29.3  What was this (referring dx) caused by? Ongoing Issue  Lonne Roan of Condition: Chronic (continuous duration > 3 months)   Laterality: Both  Current Functional Measure Score: Modified Oswestry 15 (did not answer #8)  Quick Dash 45  Objective measurements identify impairments when they are compared to normal values, the uninvolved extremity, and prior level of function.  [x]  Yes  []  No  Objective assessment of functional ability: Moderate functional limitations   SIJ pain probably 3-4 wk ago. When I am sleeping, my legs/knees hurt so bad and wakes me up. Was taking a seated yoga class and felt that exacerbated it.  Bucket list trip end of March for 19 days.  Shoulder pain on Left in last 6-7 months.   Average pain intensity:  Last 24 hours: mod-severe  Past week: mod-severe  How often does the pt experience symptoms? Frequently  How much have the symptoms interfered with usual daily activities? Quite a bit  How has condition changed since care began at this facility? NA - initial visit  In general, how is the patients overall health? Very Good   BACK PAIN (STarT Back Screening Tool) No

## 2023-11-14 ENCOUNTER — Encounter: Payer: Self-pay | Admitting: Obstetrics and Gynecology

## 2023-11-14 ENCOUNTER — Other Ambulatory Visit (HOSPITAL_BASED_OUTPATIENT_CLINIC_OR_DEPARTMENT_OTHER): Payer: Self-pay

## 2023-11-14 ENCOUNTER — Telehealth: Payer: Self-pay | Admitting: Obstetrics and Gynecology

## 2023-11-14 LAB — URINE CULTURE: Culture: >=100000 — AB

## 2023-11-14 MED ORDER — TRAMADOL HCL 50 MG PO TABS
50.0000 mg | ORAL_TABLET | Freq: Three times a day (TID) | ORAL | 0 refills | Status: AC | PRN
  Filled 2023-11-14: qty 5, 2d supply, fill #0

## 2023-11-14 MED ORDER — ACETAMINOPHEN 500 MG PO TABS
500.0000 mg | ORAL_TABLET | Freq: Four times a day (QID) | ORAL | 0 refills | Status: DC | PRN
Start: 2023-11-14 — End: 2024-02-05
  Filled 2023-11-14: qty 30, 8d supply, fill #0

## 2023-11-14 MED ORDER — POLYETHYLENE GLYCOL 3350 17 GM/SCOOP PO POWD
17.0000 g | Freq: Every day | ORAL | 0 refills | Status: DC
  Filled 2023-11-14: qty 238, 14d supply, fill #0

## 2023-11-14 MED ORDER — IBUPROFEN 600 MG PO TABS
600.0000 mg | ORAL_TABLET | Freq: Four times a day (QID) | ORAL | 0 refills | Status: DC | PRN
Start: 2023-11-14 — End: 2024-02-05
  Filled 2023-11-14: qty 30, 8d supply, fill #0

## 2023-11-14 NOTE — Telephone Encounter (Signed)
 Pt called requested to speak to Kaitlin about possibly postponing her surgery. She said she things she wants to put off until after her trip. Would like a return call.

## 2023-11-14 NOTE — Telephone Encounter (Signed)
 Called and spoke to patient about her surgery. She wants to enjoy her granddaughters graduation without concern of device being outside her body. She does want to schedule an appointment with Dr. Frutoso Jing to address the recurrent UTIs even while on Hiprex . I told her we can get her in for a follow up after her granddaughters graduation.

## 2023-11-15 ENCOUNTER — Other Ambulatory Visit (HOSPITAL_BASED_OUTPATIENT_CLINIC_OR_DEPARTMENT_OTHER): Payer: Self-pay

## 2023-11-15 ENCOUNTER — Encounter (HOSPITAL_BASED_OUTPATIENT_CLINIC_OR_DEPARTMENT_OTHER): Payer: Self-pay

## 2023-11-15 ENCOUNTER — Ambulatory Visit (HOSPITAL_BASED_OUTPATIENT_CLINIC_OR_DEPARTMENT_OTHER)

## 2023-11-15 DIAGNOSIS — R293 Abnormal posture: Secondary | ICD-10-CM

## 2023-11-15 DIAGNOSIS — G8929 Other chronic pain: Secondary | ICD-10-CM | POA: Diagnosis not present

## 2023-11-15 DIAGNOSIS — M5459 Other low back pain: Secondary | ICD-10-CM | POA: Diagnosis not present

## 2023-11-15 DIAGNOSIS — M25511 Pain in right shoulder: Secondary | ICD-10-CM | POA: Diagnosis not present

## 2023-11-15 DIAGNOSIS — M25512 Pain in left shoulder: Secondary | ICD-10-CM | POA: Diagnosis not present

## 2023-11-15 NOTE — Therapy (Signed)
 OUTPATIENT PHYSICAL THERAPY TREATMENT  Patient Name: Vanessa Trujillo MRN: [id number] DOB:1941-02-11, 83 y.o., female Today's Date: 11/15/2023  END OF SESSION:  PT End of Session - 11/15/23 1444     Visit Number 13    Number of Visits 30    Date for PT Re-Evaluation 02/02/24    Authorization Type [employer] MCR    Progress Note Due on Visit 18    PT Start Time 1306    PT Stop Time 1345    PT Time Calculation (min) 39 min    Activity Tolerance Patient tolerated treatment well    Behavior During Therapy WFL for tasks assessed/performed                   Past Medical History:  Diagnosis Date   Breast cyst    Aspirated   Chest pain    reaction to Codeine   Chronic UTI    Fibroid    GERD (gastroesophageal reflux disease)    Hematuria    Hepatitis    age 47, hospitalized   History of hiatal hernia    large per 06/05/23 CT A/P in Epic   History of kidney stones    Nonpuerperal mastitis 05/1990   OAB (overactive bladder)    Follows w/ [facility].   PONV (postoperative nausea and vomiting)    TMJ (sprain of temporomandibular joint)    around 2018   Past Surgical History:  Procedure Laterality Date   APPENDECTOMY  07/10/1957   BREAST CYST ASPIRATION     CATARACT EXTRACTION Right 09/08/2015   Dr. Danley Dusky   CHOLECYSTECTOMY  07/10/1986   CYSTOSCOPY N/A 07/16/2023   Procedure: CYSTOSCOPY with hydrodistention and intravesical botox  injection (100 units);  Surgeon: Arma Lamp, MD;  Location: [facility];  Service: Gynecology;  Laterality: N/A;   Hysteroscopic resection  04/10/1999   small fibroid polyps   LITHOTRIPSY     TONSILLECTOMY AND ADENOIDECTOMY  07/10/1944   TUBAL LIGATION  07/10/1977   BTL   Patient Active Problem List   Diagnosis Date Noted   Medication monitoring encounter 08/09/2022   Overactive bladder 10/18/2021   Recurrent UTI 10/18/2021   Depression 06/17/2016   Gastroesophageal reflux disease without esophagitis     Chest pain at rest 06/16/2016   Cough 06/16/2016   Hyperglycemia 06/16/2016   Calculus of kidney 04/16/2013   Urge incontinence 04/16/2013   Urinary urgency 04/16/2013   Infection of urinary tract 04/16/2013    REFERRING PROVIDER:  Wilhelmenia Harada, MD   REFERRING DIAG:  M53.3,G89.29 (ICD-10-CM) - Chronic left SI joint pain    Pelvic floor /SI joint Scapular dyskinesis program  Rationale for Evaluation and Treatment: Rehabilitation  THERAPY DIAG:  Chronic left shoulder pain  Chronic right shoulder pain  Other low back pain  Abnormal posture  ONSET DATE: 3-4 wk back/SIJ; 6-7 mo for shoulder   SUBJECTIVE:  SUBJECTIVE STATEMENT: Feels low back pain today and LE weakness, mostly R LE.    PERTINENT HISTORY:  See PMH  PAIN:  Are you having pain? Yes: NPRS scale: not really pain Pain location: Rt SIJ Pain description: unstable Aggravating factors: constant Relieving factors: move around  PRECAUTIONS:  None  RED FLAGS: None   WEIGHT BEARING RESTRICTIONS:  No  FALLS:  Has patient fallen in last 6 months? No   OCCUPATION:  retired  PLOF:  Independent  PATIENT GOALS:  Decrease pain, get ready for trip   OBJECTIVE:  Note: Objective measures were completed at Evaluation unless otherwise noted.  DIAGNOSTIC FINDINGS:  Mild lumbar levoscoliosis seen in xray taken 08/10/23  PATIENT SURVEYS:  Modified Oswestry 15 (did not answer #8)  Quick Dash 45 5/5:     Mod Oswestry 13 (did not answer #8)  (MDC is 10%)     Quick Dash 40.91  (MDC is 11%)  COGNITIVE STATUS: Within functional limits for tasks assessed   SENSATION: 4/9- lower back will start hurting and to lateral knee 5/5: occasional pain to RLE at night but has improved   POSTURE:  4/9- Rt shoulder depression as  expected with spinal curve, good alignment through pelvis  HAND DOMINANCE:  Right   Body Part #1 Lumbar   LOWER EXTREMITY MMT:    MMT (lb) Right eval Left eval Rt/Lt 4/9 Rt/Lt 5/5  Hip flexion      Hip extension      Hip abduction (SL at knee) 15.9 6.4 12.5/11.1 14.9/21.2  Hip adduction      Hip internal rotation      Hip external rotation      Knee flexion 12.0 10.2 16.4/12.2 23.9/20.5  Knee extension 26.3 11.0 32.1/12.7 29.2/21.4   (Blank rows = not tested)                                                                                                                               TREATMENT DATE:   Treatment                            5/8: Blank lines following charge title = not provided on this treatment date.    Manual:  TPDN No There-ex: Sidelying clam RTB 2x10R Prone hip extension with knee flexion 2x10R, 1x10L Supine piriformis stretch 30sec x3R DKTC 30sec x3 Seated HSS 2x30sec ea There-Act: Staggered bridge R posterior 2x10 Staggered sit to stands with cues for hinge x10 Self Care:    Treatment                            5/5: Blank lines following charge title = not provided on this treatment date.   Manual:  TPDN No  There-ex:  There-Act: Testing & functional outcome surveys Discussed pelvic floor rehab  & POC Self Care:  Nuro-Re-ed:  Gait  Training:     PATIENT EDUCATION:  Education details: Teacher, music of condition, POC, HEP, exercise form/rationale Person educated: Patient Education method: Explanation, Demonstration, Tactile cues, Verbal cues, and Handouts Education comprehension: verbalized understanding, returned demonstration, verbal cues required, tactile cues required, and needs further education  HOME EXERCISE PROGRAM:  [email or web address]  Access Code: [email or web address]  Morning: [email or web address]   Evening: [email or web address]   ASSESSMENT:  CLINICAL IMPRESSION: Focused on R hip strengthening today with good tolerance. Cued pt for glute  activation with all tasks. He demonstrates significant weakness in R gluteal mm compared to L LE.  Notable HS tightness present with LAQ bilaterally, so instructed pt in seated HSS with good tolerance. Felt improvement in lumbar tightness following DKTC and piriformis stretching. Educated about DOMS and management.     REHAB POTENTIAL: Good  CLINICAL DECISION MAKING: Evolving/moderate complexity  EVALUATION COMPLEXITY: Low   GOALS: Goals reviewed with patient? Yes  SHORT TERM GOALS: Target date: 2/24  Able to demo controlled rib cage flare reduction in supine, sidelying and quadruped Baseline: Goal status: achieved  2.  Gross strength to increase by 10% Baseline:  Goal status: not tested today  3.  Able to complete exercises without  increase in concordant pain Baseline:  Goal status: achieved   LONG TERM GOALS: Target date:  POC date  Functional outcome scores to improve by MDC Baseline:  Goal status: deferred to next visit 4/9  2.  Rt to Lt strength measures 85% of opposite side Baseline:  see obj Goal status: ongoing  3.  Able to stand from chairs without use of UEs Baseline: able to demo in clinic- will pay attention through her day Goal status: achieved  4.  Pt will verbalize preparedness for her trip with understanding of HEP to continue Baseline:  Goal status: achieved  5.  Able to walk 2 miles without pain or rest break  Baseline: improved to just over a mile at [employer] 4-5 times/week  Goal status: ongoing     PLAN:  PT FREQUENCY: 1-2x/week  PT DURATION: POC date  PLANNED INTERVENTIONS: 97164- PT Re-evaluation, 97110-Therapeutic exercises, 97530- Therapeutic activity, 97112- Neuromuscular re-education, 97535- Self Care, 91478- Manual therapy, [phone]- Gait training, [phone]- Aquatic Therapy, Patient/Family education, Balance training, Stair training, Taping, Dry Needling, Joint mobilization, Spinal mobilization, Cryotherapy, and Moist  heat.  PLAN FOR NEXT SESSION: continue core stability for Lt obliques/Rt abductors; qped core; gross quad/HS/gastroc strength with postural cues   Jessica C. Hightower PT, DPT 11/15/23 2:55 PM     Date of referral: 08/10/23 REFERRING PROVIDER:  Wilhelmenia Harada, MD   REFERRING DIAG:  [phone] (ICD-10-CM) - Chronic left SI joint pain   Treatment diagnosis? (if different than referring diagnosis) M54.59, M25.512, M25.511, R29.3  What was this (referring dx) caused by? Ongoing Issue  Lonne Roan of Condition: Chronic (continuous duration > 3 months)   Laterality: Both  Current Functional Measure Score: Modified Oswestry 15 (did not answer #8)  Quick Dash 45  Objective measurements identify impairments when they are compared to normal values, the uninvolved extremity, and prior level of function.  [x]  Yes  []  No  Objective assessment of functional ability: Moderate functional limitations   SIJ pain probably 3-4 wk ago. When I am sleeping, my legs/knees hurt so bad and wakes me up. Was taking a seated yoga class and felt that exacerbated it.  Bucket list trip end of March for 19 days.  Shoulder pain on Left in last 6-7 months.   Average pain intensity:  Last  24 hours: mod-severe  Past week: mod-severe  How often does the pt experience symptoms? Frequently  How much have the symptoms interfered with usual daily activities? Quite a bit  How has condition changed since care began at this facility? NA - initial visit  In general, how is the patients overall health? Very Good   BACK PAIN (STarT Back Screening Tool) No

## 2023-11-17 ENCOUNTER — Other Ambulatory Visit (HOSPITAL_BASED_OUTPATIENT_CLINIC_OR_DEPARTMENT_OTHER): Payer: Self-pay

## 2023-11-19 ENCOUNTER — Encounter (HOSPITAL_BASED_OUTPATIENT_CLINIC_OR_DEPARTMENT_OTHER): Payer: Self-pay | Admitting: Physical Therapy

## 2023-11-19 ENCOUNTER — Ambulatory Visit (HOSPITAL_BASED_OUTPATIENT_CLINIC_OR_DEPARTMENT_OTHER): Admitting: Physical Therapy

## 2023-11-19 DIAGNOSIS — G8929 Other chronic pain: Secondary | ICD-10-CM

## 2023-11-19 DIAGNOSIS — R293 Abnormal posture: Secondary | ICD-10-CM | POA: Diagnosis not present

## 2023-11-19 DIAGNOSIS — M5459 Other low back pain: Secondary | ICD-10-CM | POA: Diagnosis not present

## 2023-11-19 DIAGNOSIS — M25512 Pain in left shoulder: Secondary | ICD-10-CM | POA: Diagnosis not present

## 2023-11-19 DIAGNOSIS — M25511 Pain in right shoulder: Secondary | ICD-10-CM | POA: Diagnosis not present

## 2023-11-19 NOTE — Therapy (Signed)
 OUTPATIENT PHYSICAL THERAPY TREATMENT  Patient Name: Vanessa Trujillo MRN: [id number] DOB:[birth year], 83 y.o., female Today's Date: 11/19/2023  END OF SESSION:  PT End of Session - 11/19/23 1104     Visit Number 14    Number of Visits 30    Date for PT Re-Evaluation 02/02/24    Authorization Type UHC MCR    Authorization Time Period 5/8-6/5    Authorization - Visit Number 2    Authorization - Number of Visits 4    Progress Note Due on Visit 18    PT Start Time 1103    PT Stop Time 1144    PT Time Calculation (min) 41 min    Activity Tolerance Patient tolerated treatment well    Behavior During Therapy WFL for tasks assessed/performed                    Past Medical History:  Diagnosis Date   Breast cyst    Aspirated   Chest pain    reaction to Codeine   Chronic UTI    Fibroid    GERD (gastroesophageal reflux disease)    Hematuria    Hepatitis    age 22, hospitalized   History of hiatal hernia    large per 06/05/23 CT A/P in Epic   History of kidney stones    Nonpuerperal mastitis 05/1990   OAB (overactive bladder)    Follows w/ [facility].   PONV (postoperative nausea and vomiting)    TMJ (sprain of temporomandibular joint)    around 2018   Past Surgical History:  Procedure Laterality Date   APPENDECTOMY  07/10/1957   BREAST CYST ASPIRATION     CATARACT EXTRACTION Right 09/08/2015   Dr. Danley Dusky   CHOLECYSTECTOMY  07/10/1986   CYSTOSCOPY N/A 07/16/2023   Procedure: CYSTOSCOPY with hydrodistention and intravesical botox  injection (100 units);  Surgeon: Arma Lamp, MD;  Location: [facility];  Service: Gynecology;  Laterality: N/A;   Hysteroscopic resection  04/10/1999   small fibroid polyps   LITHOTRIPSY     TONSILLECTOMY AND ADENOIDECTOMY  07/10/1944   TUBAL LIGATION  07/10/1977   BTL   Patient Active Problem List   Diagnosis Date Noted   Medication monitoring encounter 08/09/2022   Overactive bladder 10/18/2021    Recurrent UTI 10/18/2021   Depression 06/17/2016   Gastroesophageal reflux disease without esophagitis    Chest pain at rest 06/16/2016   Cough 06/16/2016   Hyperglycemia 06/16/2016   Calculus of kidney 04/16/2013   Urge incontinence 04/16/2013   Urinary urgency 04/16/2013   Infection of urinary tract 04/16/2013    REFERRING PROVIDER:  Wilhelmenia Harada, MD   REFERRING DIAG:  M53.3,G89.29 (ICD-10-CM) - Chronic left SI joint pain    Pelvic floor /SI joint Scapular dyskinesis program  Rationale for Evaluation and Treatment: Rehabilitation  THERAPY DIAG:  Chronic left shoulder pain  Chronic right shoulder pain  Other low back pain  Abnormal posture  ONSET DATE: 3-4 wk back/SIJ; 6-7 mo for shoulder   SUBJECTIVE:  SUBJECTIVE STATEMENT: Went to [employer] and play was delayed 40 min. Did not get up during intermission. When I got up at the end of the show I went and grabbed a hand rail and stepped down with my right leg. and my right leg totally gave out and I sat down. Was wearing small wedges. Have not felt the giving at all on flat ground. I still feel like the leg cannot lift me up a step.    PERTINENT HISTORY:  See PMH  PAIN:  Are you having pain? Yes: NPRS scale: not really pain Pain location: Rt SIJ Pain description: unstable Aggravating factors: constant Relieving factors: move around  PRECAUTIONS:  None  RED FLAGS: None   WEIGHT BEARING RESTRICTIONS:  No  FALLS:  Has patient fallen in last 6 months? No   OCCUPATION:  retired  PLOF:  Independent  PATIENT GOALS:  Decrease pain, get ready for trip   OBJECTIVE:  Note: Objective measures were completed at Evaluation unless otherwise noted.  DIAGNOSTIC FINDINGS:  Mild lumbar levoscoliosis seen in xray taken  08/10/23  PATIENT SURVEYS:  Modified Oswestry 15 (did not answer #8)  Quick Dash 45 5/5:     Mod Oswestry 13 (did not answer #8)  (MDC is 10%)     Quick Dash 40.91  (MDC is 11%)  COGNITIVE STATUS: Within functional limits for tasks assessed   SENSATION: 4/9- lower back will start hurting and to lateral knee 5/5: occasional pain to RLE at night but has improved   POSTURE:  4/9- Rt shoulder depression as expected with spinal curve, good alignment through pelvis  HAND DOMINANCE:  Right   Body Part #1 Lumbar   LOWER EXTREMITY MMT:    MMT (lb) Right eval Left eval Rt/Lt 4/9 Rt/Lt 5/5  Hip flexion      Hip extension      Hip abduction (SL at knee) 15.9 6.4 12.5/11.1 14.9/21.2  Hip adduction      Hip internal rotation      Hip external rotation      Knee flexion 12.0 10.2 16.4/12.2 23.9/20.5  Knee extension 26.3 11.0 32.1/12.7 29.2/21.4   (Blank rows = not tested)                                                                                                                               TREATMENT DATE:   Treatment                            5/12: Blank lines following charge title = not provided on this treatment date.   Manual:  TPDN No  There-ex: Leg curl machine 2*8 10lb Leg extension cybex 2*10 10 lb Leg press cybex 2*10 50lb Xride 4 min Hip abd 40 Hip add 40lb There-Act:  Self Care: Discussed shoe wear and sitting too long, need for strength Nuro-Re-ed:  Gait Training:    Treatment  5/8: Blank lines following charge title = not provided on this treatment date.    Manual:  TPDN No There-ex: Sidelying clam RTB 2x10R Prone hip extension with knee flexion 2x10R, 1x10L Supine piriformis stretch 30sec x3R DKTC 30sec x3 Seated HSS 2x30sec ea There-Act: Staggered bridge R posterior 2x10 Staggered sit to stands with cues for hinge x10 Self Care:    PATIENT EDUCATION:  Education details: Anatomy of condition, POC,  HEP, exercise form/rationale Person educated: Patient Education method: Explanation, Demonstration, Tactile cues, Verbal cues, and Handouts Education comprehension: verbalized understanding, returned demonstration, verbal cues required, tactile cues required, and needs further education  HOME EXERCISE PROGRAM:  [email or web address]  Access Code: [email or web address]  Morning: [email or web address]   Evening: [email or web address]   ASSESSMENT:  CLINICAL IMPRESSION: Pt experienced her right leg giving when stepping down a step after sitting for a show. We discussed the improtance of regular movmenet, shoe wear, and need for further gross strength for bodily support. Pt did very well with gym exercises today and reported feeling looser when she left. Has 2 more visits under insurance auth. Will add upper body to gym program as well.     REHAB POTENTIAL: Good  CLINICAL DECISION MAKING: Evolving/moderate complexity  EVALUATION COMPLEXITY: Low   GOALS: Goals reviewed with patient? Yes  SHORT TERM GOALS: Target date: 2/24  Able to demo controlled rib cage flare reduction in supine, sidelying and quadruped Baseline: Goal status: achieved  2.  Gross strength to increase by 10% Baseline:  Goal status: not tested today  3.  Able to complete exercises without  increase in concordant pain Baseline:  Goal status: achieved   LONG TERM GOALS: Target date:  POC date  Functional outcome scores to improve by MDC Baseline:  Goal status: deferred to next visit 4/9  2.  Rt to Lt strength measures 85% of opposite side Baseline:  see obj Goal status: ongoing  3.  Able to stand from chairs without use of UEs Baseline: able to demo in clinic- will pay attention through her day Goal status: achieved  4.  Pt will verbalize preparedness for her trip with understanding of HEP to continue Baseline:  Goal status: achieved  5.  Able to walk 2 miles without pain or rest break  Baseline: improved to just over a mile  at [employer] 4-5 times/week  Goal status: ongoing     PLAN:  PT FREQUENCY: 1-2x/week  PT DURATION: POC date  PLANNED INTERVENTIONS: 97164- PT Re-evaluation, 97110-Therapeutic exercises, 97530- Therapeutic activity, 97112- Neuromuscular re-education, 97535- Self Care, 30160- Manual therapy, [phone]- Gait training, [phone]- Aquatic Therapy, Patient/Family education, Balance training, Stair training, Taping, Dry Needling, Joint mobilization, Spinal mobilization, Cryotherapy, and Moist heat.  PLAN FOR NEXT SESSION: gym: step ups, heel raises, upper body, balance   Georgianne Gritz C. Lysle Yero PT, DPT 11/19/23 12:07 PM     Date of referral: 08/10/23 REFERRING PROVIDER:  Wilhelmenia Harada, MD   REFERRING DIAG:  [phone] (ICD-10-CM) - Chronic left SI joint pain   Treatment diagnosis? (if different than referring diagnosis) M54.59, M25.512, M25.511, R29.3  What was this (referring dx) caused by? Ongoing Issue  Lonne Roan of Condition: Chronic (continuous duration > 3 months)   Laterality: Both  Current Functional Measure Score: Modified Oswestry 15 (did not answer #8)  Quick Dash 45  Objective measurements identify impairments when they are compared to normal values, the uninvolved extremity, and prior level of function.  [x]  Yes  []  No  Objective assessment of functional ability: Moderate functional limitations  SIJ pain probably 3-4 wk ago. When I am sleeping, my legs/knees hurt so bad and wakes me up. Was taking a seated yoga class and felt that exacerbated it.  Bucket list trip end of March for 19 days.  Shoulder pain on Left in last 6-7 months.   Average pain intensity:  Last 24 hours: mod-severe  Past week: mod-severe  How often does the pt experience symptoms? Frequently  How much have the symptoms interfered with usual daily activities? Quite a bit  How has condition changed since care began at this facility? NA - initial visit  In general, how is the patients  overall health? Very Good   BACK PAIN (STarT Back Screening Tool) No

## 2023-11-22 ENCOUNTER — Telehealth: Payer: Self-pay | Admitting: *Deleted

## 2023-11-22 ENCOUNTER — Encounter (HOSPITAL_BASED_OUTPATIENT_CLINIC_OR_DEPARTMENT_OTHER): Payer: Self-pay

## 2023-11-22 ENCOUNTER — Ambulatory Visit (HOSPITAL_BASED_OUTPATIENT_CLINIC_OR_DEPARTMENT_OTHER)

## 2023-11-22 DIAGNOSIS — M5459 Other low back pain: Secondary | ICD-10-CM | POA: Diagnosis not present

## 2023-11-22 DIAGNOSIS — G8929 Other chronic pain: Secondary | ICD-10-CM | POA: Diagnosis not present

## 2023-11-22 DIAGNOSIS — R293 Abnormal posture: Secondary | ICD-10-CM

## 2023-11-22 DIAGNOSIS — M25511 Pain in right shoulder: Secondary | ICD-10-CM | POA: Diagnosis not present

## 2023-11-22 DIAGNOSIS — M25512 Pain in left shoulder: Secondary | ICD-10-CM | POA: Diagnosis not present

## 2023-11-22 NOTE — Therapy (Signed)
 OUTPATIENT PHYSICAL THERAPY TREATMENT  Patient Name: Vanessa Trujillo MRN: [id number] DOB:09-09-1940, 83 y.o., female Today's Date: 11/22/2023  END OF SESSION:  PT End of Session - 11/22/23 1324     Visit Number 15    Number of Visits 30    Date for PT Re-Evaluation 02/02/24    Authorization Type [employer] MCR    Authorization Time Period 5/8-6/5    Authorization - Visit Number 3    Authorization - Number of Visits 4    Progress Note Due on Visit 18    PT Start Time 1304    PT Stop Time 1345    PT Time Calculation (min) 41 min    Activity Tolerance Patient tolerated treatment well    Behavior During Therapy WFL for tasks assessed/performed                     Past Medical History:  Diagnosis Date   Breast cyst    Aspirated   Chest pain    reaction to Codeine   Chronic UTI    Fibroid    GERD (gastroesophageal reflux disease)    Hematuria    Hepatitis    age 69, hospitalized   History of hiatal hernia    large per 06/05/23 CT A/P in Epic   History of kidney stones    Nonpuerperal mastitis 05/1990   OAB (overactive bladder)    Follows w/ [facility].   PONV (postoperative nausea and vomiting)    TMJ (sprain of temporomandibular joint)    around 2018   Past Surgical History:  Procedure Laterality Date   APPENDECTOMY  07/10/1957   BREAST CYST ASPIRATION     CATARACT EXTRACTION Right 09/08/2015   Dr. Danley Dusky   CHOLECYSTECTOMY  07/10/1986   CYSTOSCOPY N/A 07/16/2023   Procedure: CYSTOSCOPY with hydrodistention and intravesical botox  injection (100 units);  Surgeon: Arma Lamp, MD;  Location: [facility];  Service: Gynecology;  Laterality: N/A;   Hysteroscopic resection  04/10/1999   small fibroid polyps   LITHOTRIPSY     TONSILLECTOMY AND ADENOIDECTOMY  07/10/1944   TUBAL LIGATION  07/10/1977   BTL   Patient Active Problem List   Diagnosis Date Noted   Medication monitoring encounter 08/09/2022   Overactive bladder 10/18/2021    Recurrent UTI 10/18/2021   Depression 06/17/2016   Gastroesophageal reflux disease without esophagitis    Chest pain at rest 06/16/2016   Cough 06/16/2016   Hyperglycemia 06/16/2016   Calculus of kidney 04/16/2013   Urge incontinence 04/16/2013   Urinary urgency 04/16/2013   Infection of urinary tract 04/16/2013    REFERRING PROVIDER:  Wilhelmenia Harada, MD   REFERRING DIAG:  M53.3,G89.29 (ICD-10-CM) - Chronic left SI joint pain    Pelvic floor /SI joint Scapular dyskinesis program  Rationale for Evaluation and Treatment: Rehabilitation  THERAPY DIAG:  Chronic right shoulder pain  Chronic left shoulder pain  Other low back pain  Abnormal posture  ONSET DATE: 3-4 wk back/SIJ; 6-7 mo for shoulder   SUBJECTIVE:  SUBJECTIVE STATEMENT: Pt reports soreness after coming to the gym yesterday. "I almost couldn't get up out of the bed."   PERTINENT HISTORY:  See PMH  PAIN:  Are you having pain? Yes: NPRS scale: not really pain Pain location: Rt SIJ Pain description: unstable Aggravating factors: constant Relieving factors: move around  PRECAUTIONS:  None  RED FLAGS: None   WEIGHT BEARING RESTRICTIONS:  No  FALLS:  Has patient fallen in last 6 months? No   OCCUPATION:  retired  PLOF:  Independent  PATIENT GOALS:  Decrease pain, get ready for trip   OBJECTIVE:  Note: Objective measures were completed at Evaluation unless otherwise noted.  DIAGNOSTIC FINDINGS:  Mild lumbar levoscoliosis seen in xray taken 08/10/23  PATIENT SURVEYS:  Modified Oswestry 15 (did not answer #8)  Quick Dash 45 5/5:     Mod Oswestry 13 (did not answer #8)  (MDC is 10%)     Quick Dash 40.91  (MDC is 11%)  COGNITIVE STATUS: Within functional limits for tasks  assessed   SENSATION: 4/9- lower back will start hurting and to lateral knee 5/5: occasional pain to RLE at night but has improved   POSTURE:  4/9- Rt shoulder depression as expected with spinal curve, good alignment through pelvis  HAND DOMINANCE:  Right   Body Part #1 Lumbar   LOWER EXTREMITY MMT:    MMT (lb) Right eval Left eval Rt/Lt 4/9 Rt/Lt 5/5  Hip flexion      Hip extension      Hip abduction (SL at knee) 15.9 6.4 12.5/11.1 14.9/21.2  Hip adduction      Hip internal rotation      Hip external rotation      Knee flexion 12.0 10.2 16.4/12.2 23.9/20.5  Knee extension 26.3 11.0 32.1/12.7 29.2/21.4   (Blank rows = not tested)                                                                                                                               TREATMENT DATE:   Treatment                            5/15: Blank lines following charge title = not provided on this treatment date.   Manual:  TPDN No  There-ex: Leg curl machine 2*10 15lb Leg extension LF 2*10 10 lb Leg press cybex 2*10 50lb Xride 6 min L2 Hip abd 40lb 2x10 Hip add 40lb 2x10 LF Row machine #12 15lbs 2x10 There-Act:   Treatment                            5/12: Blank lines following charge title = not provided on this treatment date.   Manual:  TPDN No  There-ex: Leg curl machine 2*8 10lb Leg extension cybex 2*10 10 lb Leg press cybex 2*10 50lb Xride 4 min Hip abd  40 Hip add 40lb There-Act:  Self Care: Discussed shoe wear and sitting too long, need for strength Nuro-Re-ed:  Gait Training:    Treatment                            5/8: Blank lines following charge title = not provided on this treatment date.    Manual:  TPDN No There-ex: Sidelying clam RTB 2x10R Prone hip extension with knee flexion 2x10R, 1x10L Supine piriformis stretch 30sec x3R DKTC 30sec x3 Seated HSS 2x30sec ea There-Act: Staggered bridge R posterior 2x10 Staggered sit to stands with cues  for hinge x10 Self Care:    PATIENT EDUCATION:  Education details: Anatomy of condition, POC, HEP, exercise form/rationale Person educated: Patient Education method: Explanation, Demonstration, Tactile cues, Verbal cues, and Handouts Education comprehension: verbalized understanding, returned demonstration, verbal cues required, tactile cues required, and needs further education  HOME EXERCISE PROGRAM:  [email or web address]  Access Code: [email or web address]  Morning: [email or web address]   Evening: [id number]   ASSESSMENT:  CLINICAL IMPRESSION: Again reviewed gym program and machine set up with pt as she is working towards independent program. Monitored pt's form with exercises and advised in appropriate resistance. Trialed resisted row machine with cues to avoid pushing past pain limits in L shoulder blade. Pt educated about DOMS and expectations.     REHAB POTENTIAL: Good  CLINICAL DECISION MAKING: Evolving/moderate complexity  EVALUATION COMPLEXITY: Low   GOALS: Goals reviewed with patient? Yes  SHORT TERM GOALS: Target date: 2/24  Able to demo controlled rib cage flare reduction in supine, sidelying and quadruped Baseline: Goal status: achieved  2.  Gross strength to increase by 10% Baseline:  Goal status: not tested today  3.  Able to complete exercises without  increase in concordant pain Baseline:  Goal status: achieved   LONG TERM GOALS: Target date:  POC date  Functional outcome scores to improve by MDC Baseline:  Goal status: deferred to next visit 4/9  2.  Rt to Lt strength measures 85% of opposite side Baseline:  see obj Goal status: ongoing  3.  Able to stand from chairs without use of UEs Baseline: able to demo in clinic- will pay attention through her day Goal status: achieved  4.  Pt will verbalize preparedness for her trip with understanding of HEP to continue Baseline:  Goal status: achieved  5.  Able to walk 2 miles without pain or rest  break  Baseline: improved to just over a mile at [employer] 4-5 times/week  Goal status: ongoing     PLAN:  PT FREQUENCY: 1-2x/week  PT DURATION: POC date  PLANNED INTERVENTIONS: 97164- PT Re-evaluation, 97110-Therapeutic exercises, 97530- Therapeutic activity, 97112- Neuromuscular re-education, 97535- Self Care, 16109- Manual therapy, [phone]- Gait training, [phone]- Aquatic Therapy, Patient/Family education, Balance training, Stair training, Taping, Dry Needling, Joint mobilization, Spinal mobilization, Cryotherapy, and Moist heat.  PLAN FOR NEXT SESSION: gym: step ups, heel raises, upper body, balance   Herb Loges, PTA   11/22/23 1:49 PM     Date of referral: 08/10/23 REFERRING PROVIDER:  Wilhelmenia Harada, MD   REFERRING DIAG:  [id number] (ICD-10-CM) - Chronic left SI joint pain   Treatment diagnosis? (if different than referring diagnosis) M54.59, M25.512, M25.511, R29.3  What was this (referring dx) caused by? Ongoing Issue  Lonne Roan of Condition: Chronic (continuous duration > 3 months)   Laterality: Both  Current Functional Measure Score: Modified Oswestry 15 (did not answer #8)  Quick Lindalee Retort  45  Objective measurements identify impairments when they are compared to normal values, the uninvolved extremity, and prior level of function.  [x]  Yes  []  No  Objective assessment of functional ability: Moderate functional limitations   SIJ pain probably 3-4 wk ago. When I am sleeping, my legs/knees hurt so bad and wakes me up. Was taking a seated yoga class and felt that exacerbated it.  Bucket list trip end of March for 19 days.  Shoulder pain on Left in last 6-7 months.   Average pain intensity:  Last 24 hours: mod-severe  Past week: mod-severe  How often does the pt experience symptoms? Frequently  How much have the symptoms interfered with usual daily activities? Quite a bit  How has condition changed since care began at this facility? NA - initial  visit  In general, how is the patients overall health? Very Good   BACK PAIN (STarT Back Screening Tool) No

## 2023-11-22 NOTE — Telephone Encounter (Signed)
 TC to pt to r/s surgery.  LM for pt to call back. KD CMA

## 2023-11-23 ENCOUNTER — Other Ambulatory Visit (HOSPITAL_COMMUNITY)
Admission: RE | Admit: 2023-11-23 | Discharge: 2023-11-23 | Disposition: A | Discharge status: Home / Self Care | Source: Ambulatory Visit | Attending: Obstetrics and Gynecology | Admitting: Obstetrics and Gynecology

## 2023-11-23 ENCOUNTER — Other Ambulatory Visit (HOSPITAL_BASED_OUTPATIENT_CLINIC_OR_DEPARTMENT_OTHER): Payer: Self-pay

## 2023-11-23 ENCOUNTER — Ambulatory Visit

## 2023-11-23 VITALS — BP 95/85 | HR 79 | Temp 98.2°F

## 2023-11-23 DIAGNOSIS — N390 Urinary tract infection, site not specified: Secondary | ICD-10-CM | POA: Insufficient documentation

## 2023-11-23 DIAGNOSIS — R319 Hematuria, unspecified: Secondary | ICD-10-CM | POA: Insufficient documentation

## 2023-11-23 DIAGNOSIS — R300 Dysuria: Secondary | ICD-10-CM | POA: Diagnosis not present

## 2023-11-23 LAB — POCT URINALYSIS DIPSTICK
Bilirubin, UA: NEGATIVE
Glucose, UA: NEGATIVE
Nitrite, UA: NEGATIVE
Protein, UA: POSITIVE — AB
Spec Grav, UA: 1.025 (ref 1.010–1.025)
Urobilinogen, UA: 0.2 U/dL
pH, UA: 5.5 (ref 5.0–8.0)

## 2023-11-23 MED ORDER — NITROFURANTOIN MONOHYD MACRO 100 MG PO CAPS
100.0000 mg | ORAL_CAPSULE | Freq: Two times a day (BID) | ORAL | 0 refills | Status: AC
  Filled 2023-11-23: qty 10, 5d supply, fill #0

## 2023-11-23 NOTE — Progress Notes (Addendum)
 Vanessa Trujillo arrived today with dysuria, urinary incontinence, and urinary urgency. Patient is not experiencing fever, unstable vitals and/or one-sided back flank pain. Patient has not had had a recent hospitalization due to UTI.  Last visit in the office was 11/22/2023.  Per protocol:   The most recent Urinalysis completed on 11-12-2023 and was not normal.  Last Creatinine level  Lab Results  Component Value Date   CREATININE 0.61 06/16/2016    An urine specimen was collected and POCT urinalysis completed. [] A cath specimen was collected due to patient's current condition, symptoms or post-procedural state.  Total urine output by catheter is  Output by Drain (mL) 11/21/23 0701 - 11/21/23 1900 11/21/23 1901 - 11/22/23 0700 11/22/23 0701 - 11/22/23 1900 11/22/23 1901 - 11/23/23 0700 11/23/23 0701 - 11/23/23 0941  Patient has no LDAs of requested type attached.    Aaron Aas    POCT Urine results is not normal.  Urine micro was not sent due to not enough urine collection. The culture was sent per protocol for abnormal urinalysis.     [x] Pt was notified of positive urine results and plan for additional urine testing. We will contact you within the next 3-4 days with these results.  [] No Prescription was sent to your pharmacy.  The additional testing will indicate if a prescription is needed.   [] Patient was notified of abnormal urine results. The following prescription is sent to your preferred pharmacy.  [x]  Macrobid  100mg  #10 1 tablet by mouth twice daily with food for 5 days      []  Bactrim DS 800-160mg  #6 1 tablet by mouth twice daily for 3 days        []  Due to your current medication allergies, an alternate prescription was discussed with your provider and will be prescribed and sent to your pharmacy.  [] You can take over the counter AZO two tablets up to three times a day for two days.  Take AZO tablets with a full glass of water . AZO will turn your urine orange, this is normal.   [] The patient was  notified of negative urine results.  If symptoms persist, you may take over the counter AZO two tablets up to three times a day for two days.  AZO will turn your urine orange, this is normal.  Contact the office back to schedule an appointment if your symptoms persist or worsen or you develop additional symptoms.       CC'd note to patient's provider.

## 2023-11-23 NOTE — Telephone Encounter (Signed)
 Pt has apt in office Fri 11/23/23 KD CMA

## 2023-11-23 NOTE — Addendum Note (Signed)
 Addended by: Graciela Lava on: 11/23/2023 04:43 PM   Modules accepted: Orders

## 2023-11-23 NOTE — Patient Instructions (Signed)
 Please keep all scheduled follow ups as planned.  It was a pleasure to see you today!  Thank you for trusting me with your care!

## 2023-11-25 LAB — URINE CULTURE: Culture: 40000 — AB

## 2023-11-26 ENCOUNTER — Other Ambulatory Visit: Payer: Self-pay | Admitting: Obstetrics and Gynecology

## 2023-11-26 ENCOUNTER — Other Ambulatory Visit (HOSPITAL_BASED_OUTPATIENT_CLINIC_OR_DEPARTMENT_OTHER): Payer: Self-pay

## 2023-11-26 ENCOUNTER — Ambulatory Visit: Payer: Self-pay | Admitting: Obstetrics and Gynecology

## 2023-11-26 DIAGNOSIS — N390 Urinary tract infection, site not specified: Secondary | ICD-10-CM

## 2023-11-26 MED ORDER — SULFAMETHOXAZOLE-TRIMETHOPRIM 400-80 MG PO TABS
0.5000 | ORAL_TABLET | Freq: Every day | ORAL | 0 refills | Status: DC
  Filled 2023-11-26: qty 50, 100d supply, fill #0

## 2023-11-26 NOTE — Progress Notes (Signed)
 Called and discussed Recurrent UTI with patient. Dr. Frutoso Jing suggested that in the setting of recurrent UTI's we should start patient on antibioitc prophylaxis as she has previously been on Methenamine .   We discussed finishing her current treatment regimen and then starting a daily low dose antibiotic. Will plan for her to do daily low dose Bactrim . If symptoms persist in the setting of the same present bacteria would suspect colonization.   Patient to follow up to discuss continue prophylaxis following her Stage 1 procedure.

## 2023-11-27 ENCOUNTER — Encounter: Payer: Self-pay | Admitting: *Deleted

## 2023-11-28 ENCOUNTER — Encounter (HOSPITAL_BASED_OUTPATIENT_CLINIC_OR_DEPARTMENT_OTHER): Payer: Self-pay

## 2023-11-28 ENCOUNTER — Ambulatory Visit (HOSPITAL_BASED_OUTPATIENT_CLINIC_OR_DEPARTMENT_OTHER)

## 2023-11-28 NOTE — Therapy (Incomplete)
 OUTPATIENT PHYSICAL THERAPY TREATMENT  Patient Name: Vanessa Trujillo MRN: [id number] DOB:Jul 27, 1940, 83 y.o., female Today's Date: 11/28/2023  END OF SESSION:            Past Medical History:  Diagnosis Date   Breast cyst    Aspirated   Chest pain    reaction to Codeine   Chronic UTI    Fibroid    GERD (gastroesophageal reflux disease)    Hematuria    Hepatitis    age 64, hospitalized   History of hiatal hernia    large per 06/05/23 CT A/P in Epic   History of kidney stones    Nonpuerperal mastitis 05/1990   OAB (overactive bladder)    Follows w/ [facility].   PONV (postoperative nausea and vomiting)    TMJ (sprain of temporomandibular joint)    around 2018   Past Surgical History:  Procedure Laterality Date   APPENDECTOMY  07/10/1957   BREAST CYST ASPIRATION     CATARACT EXTRACTION Right 09/08/2015   Dr. Danley Dusky   CHOLECYSTECTOMY  07/10/1986   CYSTOSCOPY N/A 07/16/2023   Procedure: CYSTOSCOPY with hydrodistention and intravesical botox  injection (100 units);  Surgeon: Arma Lamp, MD;  Location: [facility];  Service: Gynecology;  Laterality: N/A;   Hysteroscopic resection  04/10/1999   small fibroid polyps   LITHOTRIPSY     TONSILLECTOMY AND ADENOIDECTOMY  07/10/1944   TUBAL LIGATION  07/10/1977   BTL   Patient Active Problem List   Diagnosis Date Noted   Medication monitoring encounter 08/09/2022   Overactive bladder 10/18/2021   Recurrent UTI 10/18/2021   Depression 06/17/2016   Gastroesophageal reflux disease without esophagitis    Chest pain at rest 06/16/2016   Cough 06/16/2016   Hyperglycemia 06/16/2016   Calculus of kidney 04/16/2013   Urge incontinence 04/16/2013   Urinary urgency 04/16/2013   Infection of urinary tract 04/16/2013    REFERRING PROVIDER:  Wilhelmenia Harada, MD   REFERRING DIAG:  M53.3,G89.29 (ICD-10-CM) - Chronic left SI joint pain    Pelvic floor /SI joint Scapular dyskinesis  program  Rationale for Evaluation and Treatment: Rehabilitation  THERAPY DIAG:  No diagnosis found.  ONSET DATE: 3-4 wk back/SIJ; 6-7 mo for shoulder   SUBJECTIVE:                                                                                                                                                                                           SUBJECTIVE STATEMENT: Pt reports soreness after coming to the gym yesterday. "I almost couldn't get up out of the bed."   PERTINENT HISTORY:  See PMH  PAIN:  Are you having pain? Yes: NPRS scale: not really pain Pain location: Rt SIJ Pain description: unstable Aggravating factors: constant Relieving factors: move around  PRECAUTIONS:  None  RED FLAGS: None   WEIGHT BEARING RESTRICTIONS:  No  FALLS:  Has patient fallen in last 6 months? No   OCCUPATION:  retired  PLOF:  Independent  PATIENT GOALS:  Decrease pain, get ready for trip   OBJECTIVE:  Note: Objective measures were completed at Evaluation unless otherwise noted.  DIAGNOSTIC FINDINGS:  Mild lumbar levoscoliosis seen in xray taken 08/10/23  PATIENT SURVEYS:  Modified Oswestry 15 (did not answer #8)  Quick Dash 45 5/5:     Mod Oswestry 13 (did not answer #8)  (MDC is 10%)     Quick Dash 40.91  (MDC is 11%)  COGNITIVE STATUS: Within functional limits for tasks assessed   SENSATION: 4/9- lower back will start hurting and to lateral knee 5/5: occasional pain to RLE at night but has improved   POSTURE:  4/9- Rt shoulder depression as expected with spinal curve, good alignment through pelvis  HAND DOMINANCE:  Right   Body Part #1 Lumbar   LOWER EXTREMITY MMT:    MMT (lb) Right eval Left eval Rt/Lt 4/9 Rt/Lt 5/5  Hip flexion      Hip extension      Hip abduction (SL at knee) 15.9 6.4 12.5/11.1 14.9/21.2  Hip adduction      Hip internal rotation      Hip external rotation      Knee flexion 12.0 10.2 16.4/12.2 23.9/20.5  Knee  extension 26.3 11.0 32.1/12.7 29.2/21.4   (Blank rows = not tested)                                                                                                                               TREATMENT DATE:   Treatment                            5/15: Blank lines following charge title = not provided on this treatment date.   Manual:  TPDN No  There-ex: Leg curl machine 2*10 15lb Leg extension LF 2*10 10 lb Leg press cybex 2*10 50lb Xride 6 min L2 Hip abd 40lb 2x10 Hip add 40lb 2x10 LF Row machine #12 15lbs 2x10 There-Act:   Treatment                            5/12: Blank lines following charge title = not provided on this treatment date.   Manual:  TPDN No  There-ex: Leg curl machine 2*8 10lb Leg extension cybex 2*10 10 lb Leg press cybex 2*10 50lb Xride 4 min Hip abd 40 Hip add 40lb There-Act:  Self Care: Discussed shoe wear and sitting too long, need for strength Nuro-Re-ed:  Gait Training:    Treatment  5/8: Blank lines following charge title = not provided on this treatment date.    Manual:  TPDN No There-ex: Sidelying clam RTB 2x10R Prone hip extension with knee flexion 2x10R, 1x10L Supine piriformis stretch 30sec x3R DKTC 30sec x3 Seated HSS 2x30sec ea There-Act: Staggered bridge R posterior 2x10 Staggered sit to stands with cues for hinge x10 Self Care:    PATIENT EDUCATION:  Education details: Anatomy of condition, POC, HEP, exercise form/rationale Person educated: Patient Education method: Explanation, Demonstration, Tactile cues, Verbal cues, and Handouts Education comprehension: verbalized understanding, returned demonstration, verbal cues required, tactile cues required, and needs further education  HOME EXERCISE PROGRAM:  [email or web address]  Access Code: [email or web address]  Morning: [email or web address]   Evening: [id number]   ASSESSMENT:  CLINICAL IMPRESSION: Again reviewed gym program and machine set up  with pt as she is working towards independent program. Monitored pt's form with exercises and advised in appropriate resistance. Trialed resisted row machine with cues to avoid pushing past pain limits in L shoulder blade. Pt educated about DOMS and expectations.     REHAB POTENTIAL: Good  CLINICAL DECISION MAKING: Evolving/moderate complexity  EVALUATION COMPLEXITY: Low   GOALS: Goals reviewed with patient? Yes  SHORT TERM GOALS: Target date: 2/24  Able to demo controlled rib cage flare reduction in supine, sidelying and quadruped Baseline: Goal status: achieved  2.  Gross strength to increase by 10% Baseline:  Goal status: not tested today  3.  Able to complete exercises without  increase in concordant pain Baseline:  Goal status: achieved   LONG TERM GOALS: Target date:  POC date  Functional outcome scores to improve by MDC Baseline:  Goal status: deferred to next visit 4/9  2.  Rt to Lt strength measures 85% of opposite side Baseline:  see obj Goal status: ongoing  3.  Able to stand from chairs without use of UEs Baseline: able to demo in clinic- will pay attention through her day Goal status: achieved  4.  Pt will verbalize preparedness for her trip with understanding of HEP to continue Baseline:  Goal status: achieved  5.  Able to walk 2 miles without pain or rest break  Baseline: improved to just over a mile at [employer] 4-5 times/week  Goal status: ongoing     PLAN:  PT FREQUENCY: 1-2x/week  PT DURATION: POC date  PLANNED INTERVENTIONS: 97164- PT Re-evaluation, 97110-Therapeutic exercises, 97530- Therapeutic activity, 97112- Neuromuscular re-education, 97535- Self Care, 16109- Manual therapy, [phone]- Gait training, [phone]- Aquatic Therapy, Patient/Family education, Balance training, Stair training, Taping, Dry Needling, Joint mobilization, Spinal mobilization, Cryotherapy, and Moist heat.  PLAN FOR NEXT SESSION: gym: step ups, heel raises,  upper body, balance   Herb Loges, PTA   11/28/23 9:57 AM     Date of referral: 08/10/23 REFERRING PROVIDER:  Wilhelmenia Harada, MD   REFERRING DIAG:  [phone] (ICD-10-CM) - Chronic left SI joint pain   Treatment diagnosis? (if different than referring diagnosis) M54.59, M25.512, M25.511, R29.3  What was this (referring dx) caused by? Ongoing Issue  Lonne Roan of Condition: Chronic (continuous duration > 3 months)   Laterality: Both  Current Functional Measure Score: Modified Oswestry 15 (did not answer #8)  Quick Dash 45  Objective measurements identify impairments when they are compared to normal values, the uninvolved extremity, and prior level of function.  [x]  Yes  []  No  Objective assessment of functional ability: Moderate functional limitations   SIJ pain probably 3-4 wk ago. When I am sleeping, my legs/knees hurt so bad  and wakes me up. Was taking a seated yoga class and felt that exacerbated it.  Bucket list trip end of March for 19 days.  Shoulder pain on Left in last 6-7 months.   Average pain intensity:  Last 24 hours: mod-severe  Past week: mod-severe  How often does the pt experience symptoms? Frequently  How much have the symptoms interfered with usual daily activities? Quite a bit  How has condition changed since care began at this facility? NA - initial visit  In general, how is the patients overall health? Very Good   BACK PAIN (STarT Back Screening Tool) No

## 2023-11-30 ENCOUNTER — Other Ambulatory Visit (HOSPITAL_BASED_OUTPATIENT_CLINIC_OR_DEPARTMENT_OTHER): Payer: Self-pay

## 2023-11-30 ENCOUNTER — Ambulatory Visit (HOSPITAL_BASED_OUTPATIENT_CLINIC_OR_DEPARTMENT_OTHER): Admitting: Orthopaedic Surgery

## 2023-11-30 ENCOUNTER — Ambulatory Visit (HOSPITAL_BASED_OUTPATIENT_CLINIC_OR_DEPARTMENT_OTHER)

## 2023-11-30 ENCOUNTER — Encounter (HOSPITAL_BASED_OUTPATIENT_CLINIC_OR_DEPARTMENT_OTHER): Payer: Self-pay

## 2023-11-30 DIAGNOSIS — M25512 Pain in left shoulder: Secondary | ICD-10-CM | POA: Diagnosis not present

## 2023-11-30 DIAGNOSIS — R293 Abnormal posture: Secondary | ICD-10-CM | POA: Diagnosis not present

## 2023-11-30 DIAGNOSIS — G8929 Other chronic pain: Secondary | ICD-10-CM

## 2023-11-30 DIAGNOSIS — M5459 Other low back pain: Secondary | ICD-10-CM | POA: Diagnosis not present

## 2023-11-30 DIAGNOSIS — M7918 Myalgia, other site: Secondary | ICD-10-CM | POA: Diagnosis not present

## 2023-11-30 DIAGNOSIS — M25511 Pain in right shoulder: Secondary | ICD-10-CM | POA: Diagnosis not present

## 2023-11-30 NOTE — Progress Notes (Signed)
 Chief Complaint: Lower back pain, left scapular pain     History of Present Illness:   11/30/2023: Presents today for follow-up of bilateral hip and buttocks pain.  She did get bilateral ultrasound-guided injections of the SI joint with Dr. Vaughn Georges which did give her some temporary relief.  She does have a possible nerve stimulator placement pending  Vanessa Trujillo is a 83 y.o. female presents with issues with her left posterior shoulder as well as the left lower back.  She states that she is concerned of a history of lumbar stenosis as her mother suffered this.  She does have pain that radiates down the posterior aspect of both buttocks as well as into both SI joints.  With regard to the left shoulder she is experiencing pain at the inferior angle of the left scapula.  She states this feels like internal shingles although her testing for this was negative.  She has been trialing naproxen with some relief.  She has not had any physical therapy    PMH/PSH/Family History/Social History/Meds/Allergies:    Past Medical History:  Diagnosis Date   Breast cyst    Aspirated   Chest pain    reaction to Codeine   Chronic UTI    Fibroid    GERD (gastroesophageal reflux disease)    Hematuria    Hepatitis    age 76, hospitalized   History of hiatal hernia    large per 06/05/23 CT A/P in Epic   History of kidney stones    Nonpuerperal mastitis 05/1990   OAB (overactive bladder)    Follows w/ [facility].   PONV (postoperative nausea and vomiting)    TMJ (sprain of temporomandibular joint)    around 2018   Past Surgical History:  Procedure Laterality Date   APPENDECTOMY  07/10/1957   BREAST CYST ASPIRATION     CATARACT EXTRACTION Right 09/08/2015   Dr. Danley Dusky   CHOLECYSTECTOMY  07/10/1986   CYSTOSCOPY N/A 07/16/2023   Procedure: CYSTOSCOPY with hydrodistention and intravesical botox  injection (100 units);  Surgeon: Arma Lamp, MD;  Location: [facility];  Service: Gynecology;  Laterality: N/A;   Hysteroscopic resection  04/10/1999   small fibroid polyps   LITHOTRIPSY     TONSILLECTOMY AND ADENOIDECTOMY  07/10/1944   TUBAL LIGATION  07/10/1977   BTL   Social History   Socioeconomic History   Marital status: Widowed    Spouse name: Not on file   Number of children: Not on file   Years of education: Not on file   Highest education level: Not on file  Occupational History   Occupation: Retired.  Tobacco Use   Smoking status: Never   Smokeless tobacco: Never  Vaping Use   Vaping status: Never Used  Substance and Sexual Activity   Alcohol use: No   Drug use: No   Sexual activity: Not Currently    Partners: Male    Birth control/protection: Post-menopausal  Other Topics Concern   Not on file  Social History Narrative   Pt widowed since 2012, lives alone.   Social Drivers of Corporate investment banker Strain: Not on file  Food Insecurity: Not on file  Transportation Needs: Not on file  Physical Activity: Not on file  Stress: Not on file  Social Connections: Not on file   Family History  Problem Relation Age of Onset   Breast cancer Mother 49   Heart failure Father    Emphysema Father  Died age 59   Breast cancer Maternal Grandmother 55   Allergies  Allergen Reactions   Codeine     Chest pain   Atorvastatin     Other reaction(s): muscle   Pravastatin     Other reaction(s): muscle pain   Rosuvastatin Calcium     Other reaction(s): muscle pain   Zoster Vac Recomb Adjuvanted Other (See Comments)    Shingles vaccine - very painful muscles and joints aches   Current Outpatient Medications  Medication Sig Dispense Refill   sulfamethoxazole -trimethoprim  (BACTRIM ) 400-80 MG tablet Take 0.5 tablets by mouth daily. 50 tablet 0   acetaminophen  (TYLENOL ) 500 MG tablet Take 1 tablet (500 mg total) by mouth every 6 (six) hours as needed (pain). 30 tablet 0   Ascorbic Acid (VITAMIN C ) 1000 MG tablet Take 1  tablet (1,000 mg total) by mouth in the morning, at noon, in the evening, and at bedtime. (Patient taking differently: Take 1,000 mg by mouth in the morning and at bedtime.) 120 tablet 2   citalopram  (CELEXA ) 20 MG tablet Take 1 tablet (20 mg total) by mouth daily. 90 tablet 3   Estradiol  10 MCG TABS vaginal tablet PLACE 1 TABLET VAGINALLY 2 TIMES A WEEK. 24 tablet 3   Evolocumab  (REPATHA  SURECLICK) 140 MG/ML SOAJ Inject 140 mg into the skin every 14 (fourteen) days. 6 mL 3   ibuprofen  (ADVIL ) 600 MG tablet Take 1 tablet (600 mg total) by mouth every 6 (six) hours as needed. 30 tablet 0   Multiple Vitamins-Minerals (PRESERVISION AREDS PO) Take by mouth.     NONFORMULARY OR COMPOUNDED ITEM Amitriptyline 2.5%/ gabapentin 2.5%/ baclofen 2.5% in vaginal cream.  Place 1g daily in vaginal/ vulvar area.  Dispense 60g with 5 refills. 60 each 5   omeprazole  (PRILOSEC) 10 MG capsule Take 1 capsule (10 mg total) by mouth daily. 90 capsule 4   polyethylene glycol powder (GLYCOLAX /MIRALAX ) 17 GM/SCOOP powder Take 17 g by mouth daily. Drink 17g (1 scoop) dissolved in water  per day. 238 g 0   Probiotic Product (ALIGN) 4 MG CAPS      vitamin B-12 (CYANOCOBALAMIN ) 100 MCG tablet Take 100 mcg by mouth daily.     No current facility-administered medications for this visit.   No results found.  Review of Systems:   A ROS was performed including pertinent positives and negatives as documented in the HPI.  Physical Exam :   Constitutional: NAD and appears stated age Neurological: Alert and oriented Psych: Appropriate affect and cooperative Last menstrual period 07/11/1999.   Comprehensive Musculoskeletal Exam:    Right hip with tenderness about the greater trochanter as well as the left as well.  She does have a significant Trendelenburg gait bilaterally.  Range of motion of both hips is to 30 degrees internal/external rotation without pain.  There is pain with resisted abduction bilaterally positive  bilateral pain about the SI joints.   Imaging:   Xray (lumbar spine 4 views): Bilateral SI arthritis     I personally reviewed and interpreted the radiographs.   Assessment and Plan:   83 y.o. 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  I do believe this may be the underlying etiology of her SI pain as well.  Given this I would like to plan to send her for an MRI of her hip and lower back to assess for any type of lumbar disc herniation and she has now failed physical  therapy as well as multiple injections  -Return to clinic following MRI   I personally saw and evaluated the patient, and participated in the management and treatment plan.  Wilhelmenia Harada, MD Attending Physician, Orthopedic Surgery  This document was dictated using Dragon voice recognition software. A reasonable attempt at proof reading has been made to minimize errors.

## 2023-11-30 NOTE — Therapy (Signed)
 OUTPATIENT PHYSICAL THERAPY TREATMENT  Patient Name: Vanessa Trujillo MRN: [id number] DOB:01/26/1941, 83 y.o., female Today's Date: 11/30/2023  END OF SESSION:  PT End of Session - 11/30/23 1102     Visit Number 16    Number of Visits 30    Date for PT Re-Evaluation 02/02/24    Authorization Type [employer] MCR    Authorization Time Period 5/8-6/5    Authorization - Number of Visits 5    Progress Note Due on Visit 18    PT Start Time 1103    PT Stop Time 1142    PT Time Calculation (min) 39 min    Activity Tolerance Patient limited by pain    Behavior During Therapy [facility] for tasks assessed/performed                      Past Medical History:  Diagnosis Date   Breast cyst    Aspirated   Chest pain    reaction to Codeine   Chronic UTI    Fibroid    GERD (gastroesophageal reflux disease)    Hematuria    Hepatitis    age 2, hospitalized   History of hiatal hernia    large per 06/05/23 CT A/P in Epic   History of kidney stones    Nonpuerperal mastitis 05/1990   OAB (overactive bladder)    Follows w/ [facility].   PONV (postoperative nausea and vomiting)    TMJ (sprain of temporomandibular joint)    around 2018   Past Surgical History:  Procedure Laterality Date   APPENDECTOMY  07/10/1957   BREAST CYST ASPIRATION     CATARACT EXTRACTION Right 09/08/2015   Dr. Danley Dusky   CHOLECYSTECTOMY  07/10/1986   CYSTOSCOPY N/A 07/16/2023   Procedure: CYSTOSCOPY with hydrodistention and intravesical botox  injection (100 units);  Surgeon: Arma Lamp, MD;  Location: [facility];  Service: Gynecology;  Laterality: N/A;   Hysteroscopic resection  04/10/1999   small fibroid polyps   LITHOTRIPSY     TONSILLECTOMY AND ADENOIDECTOMY  07/10/1944   TUBAL LIGATION  07/10/1977   BTL   Patient Active Problem List   Diagnosis Date Noted   Medication monitoring encounter 08/09/2022   Overactive bladder 10/18/2021   Recurrent UTI 10/18/2021   Depression  06/17/2016   Gastroesophageal reflux disease without esophagitis    Chest pain at rest 06/16/2016   Cough 06/16/2016   Hyperglycemia 06/16/2016   Calculus of kidney 04/16/2013   Urge incontinence 04/16/2013   Urinary urgency 04/16/2013   Infection of urinary tract 04/16/2013    REFERRING PROVIDER:  Wilhelmenia Harada, MD   REFERRING DIAG:  M53.3,G89.29 (ICD-10-CM) - Chronic left SI joint pain    Pelvic floor /SI joint Scapular dyskinesis program  Rationale for Evaluation and Treatment: Rehabilitation  THERAPY DIAG:  Chronic right shoulder pain  Chronic left shoulder pain  Other low back pain  Abnormal posture  ONSET DATE: 3-4 wk back/SIJ; 6-7 mo for shoulder   SUBJECTIVE:  SUBJECTIVE STATEMENT: Arrives with increased pain. Pt reports she went to the gym everyday after last session. " I think I overdid it." Pt reports then she went to [place]. Has ongoing UTI and is on antibiotics. She saw Dr. Hermina Loosen this morning who is ordering MRI. Has to stretch before getting out of bed.    PERTINENT HISTORY:  See PMH  PAIN:  Are you having pain? Yes: NPRS scale: 8/10 Pain location: low back (across both sides) Pain description: unstable Aggravating factors: constant Relieving factors: move around  PRECAUTIONS:  None  RED FLAGS: None   WEIGHT BEARING RESTRICTIONS:  No  FALLS:  Has patient fallen in last 6 months? No   OCCUPATION:  retired  PLOF:  Independent  PATIENT GOALS:  Decrease pain, get ready for trip   OBJECTIVE:  Note: Objective measures were completed at Evaluation unless otherwise noted.  DIAGNOSTIC FINDINGS:  Mild lumbar levoscoliosis seen in xray taken 08/10/23  PATIENT SURVEYS:  Modified Oswestry 15 (did not answer #8)  Quick Dash 45 5/5:     Mod Oswestry  13 (did not answer #8)  (MDC is 10%)     Quick Dash 40.91  (MDC is 11%)  COGNITIVE STATUS: Within functional limits for tasks assessed   SENSATION: 4/9- lower back will start hurting and to lateral knee 5/5: occasional pain to RLE at night but has improved   POSTURE:  4/9- Rt shoulder depression as expected with spinal curve, good alignment through pelvis  HAND DOMINANCE:  Right   Body Part #1 Lumbar   LOWER EXTREMITY MMT:    MMT (lb) Right eval Left eval Rt/Lt 4/9 Rt/Lt 5/5  Hip flexion      Hip extension      Hip abduction (SL at knee) 15.9 6.4 12.5/11.1 14.9/21.2  Hip adduction      Hip internal rotation      Hip external rotation      Knee flexion 12.0 10.2 16.4/12.2 23.9/20.5  Knee extension 26.3 11.0 32.1/12.7 29.2/21.4   (Blank rows = not tested)                                                                                                                               TREATMENT DATE:    Treatment                            5/15: -DKTC with red physioball -LTR with red physioball -STM to lumbar ps, glutes in sidelying -trial of single static cup to L lumbar ps -biofreeze application    Treatment                            5/15: Blank lines following charge title = not provided on this treatment date.   Manual:  TPDN No  There-ex: Leg curl machine 2*10 15lb Leg extension LF 2*10 10 lb  Leg press cybex 2*10 50lb Xride 6 min L2 Hip abd 40lb 2x10 Hip add 40lb 2x10 LF Row machine #12 15lbs 2x10 There-Act:   Treatment                            5/12: Blank lines following charge title = not provided on this treatment date.   Manual:  TPDN No  There-ex: Leg curl machine 2*8 10lb Leg extension cybex 2*10 10 lb Leg press cybex 2*10 50lb Xride 4 min Hip abd 40 Hip add 40lb There-Act:  Self Care: Discussed shoe wear and sitting too long, need for strength Nuro-Re-ed:  Gait Training:    Treatment                             5/8: Blank lines following charge title = not provided on this treatment date.    Manual:  TPDN No There-ex: Sidelying clam RTB 2x10R Prone hip extension with knee flexion 2x10R, 1x10L Supine piriformis stretch 30sec x3R DKTC 30sec x3 Seated HSS 2x30sec ea There-Act: Staggered bridge R posterior 2x10 Staggered sit to stands with cues for hinge x10 Self Care:    PATIENT EDUCATION:  Education details: Anatomy of condition, POC, HEP, exercise form/rationale Person educated: Patient Education method: Explanation, Demonstration, Tactile cues, Verbal cues, and Handouts Education comprehension: verbalized understanding, returned demonstration, verbal cues required, tactile cues required, and needs further education  HOME EXERCISE PROGRAM:  [email or web address]  Access Code: [email or web address]  Morning: [id number]   Evening: [id number]   ASSESSMENT:  CLINICAL IMPRESSION: Pt with increased pain level today, unbale to tolerate full threex program. Spent time on MT including STM and IASTM using tennis ball gently to lumbar ps and glute mm. She was very tneder here and unable to tolerate much pressure. Trialed static cupping over L lumbar ps as well to see if this provided relief. Applied biofreeze to affected area to address pain. She reported feeling a little looser at end of session. Instructed pt to try a knee pillow for sleeping at night as well as to try biofreeze of other topical if beneficial. Pt to continue with use of heat.     REHAB POTENTIAL: Good  CLINICAL DECISION MAKING: Evolving/moderate complexity  EVALUATION COMPLEXITY: Low   GOALS: Goals reviewed with patient? Yes  SHORT TERM GOALS: Target date: 2/24  Able to demo controlled rib cage flare reduction in supine, sidelying and quadruped Baseline: Goal status: achieved  2.  Gross strength to increase by 10% Baseline:  Goal status: not tested today  3.  Able to complete exercises without  increase in concordant  pain Baseline:  Goal status: achieved   LONG TERM GOALS: Target date:  POC date  Functional outcome scores to improve by MDC Baseline:  Goal status: deferred to next visit 4/9  2.  Rt to Lt strength measures 85% of opposite side Baseline:  see obj Goal status: ongoing  3.  Able to stand from chairs without use of UEs Baseline: able to demo in clinic- will pay attention through her day Goal status: achieved  4.  Pt will verbalize preparedness for her trip with understanding of HEP to continue Baseline:  Goal status: achieved  5.  Able to walk 2 miles without pain or rest break  Baseline: improved to just over a mile at [employer] 4-5 times/week  Goal status: ongoing     PLAN:  PT FREQUENCY: 1-2x/week  PT DURATION: POC date  PLANNED INTERVENTIONS: 97164- PT Re-evaluation, 97110-Therapeutic exercises, 97530- Therapeutic activity, 97112- Neuromuscular re-education, 97535- Self Care, 16109- Manual therapy, [phone]- Gait training, [phone]- Aquatic Therapy, Patient/Family education, Balance training, Stair training, Taping, Dry Needling, Joint mobilization, Spinal mobilization, Cryotherapy, and Moist heat.  PLAN FOR NEXT SESSION: gym: step ups, heel raises, upper body, balance   Herb Loges, PTA   11/30/23 11:59 AM     Date of referral: 08/10/23 REFERRING PROVIDER:  Wilhelmenia Harada, MD   REFERRING DIAG:  [phone] (ICD-10-CM) - Chronic left SI joint pain   Treatment diagnosis? (if different than referring diagnosis) M54.59, M25.512, M25.511, R29.3  What was this (referring dx) caused by? Ongoing Issue  Lonne Roan of Condition: Chronic (continuous duration > 3 months)   Laterality: Both  Current Functional Measure Score: Modified Oswestry 15 (did not answer #8)  Quick Dash 45  Objective measurements identify impairments when they are compared to normal values, the uninvolved extremity, and prior level of function.  [x]  Yes  []  No  Objective assessment  of functional ability: Moderate functional limitations   SIJ pain probably 3-4 wk ago. When I am sleeping, my legs/knees hurt so bad and wakes me up. Was taking a seated yoga class and felt that exacerbated it.  Bucket list trip end of March for 19 days.  Shoulder pain on Left in last 6-7 months.   Average pain intensity:  Last 24 hours: mod-severe  Past week: mod-severe  How often does the pt experience symptoms? Frequently  How much have the symptoms interfered with usual daily activities? Quite a bit  How has condition changed since care began at this facility? NA - initial visit  In general, how is the patients overall health? Very Good   BACK PAIN (STarT Back Screening Tool) No

## 2023-12-01 ENCOUNTER — Encounter (HOSPITAL_BASED_OUTPATIENT_CLINIC_OR_DEPARTMENT_OTHER): Payer: Self-pay | Admitting: Orthopaedic Surgery

## 2023-12-01 ENCOUNTER — Encounter (HOSPITAL_BASED_OUTPATIENT_CLINIC_OR_DEPARTMENT_OTHER): Payer: Self-pay | Admitting: Physical Therapy

## 2023-12-04 ENCOUNTER — Encounter (HOSPITAL_BASED_OUTPATIENT_CLINIC_OR_DEPARTMENT_OTHER)

## 2023-12-04 ENCOUNTER — Encounter (HOSPITAL_COMMUNITY): Admission: RE | Payer: Self-pay | Source: Home / Self Care

## 2023-12-04 ENCOUNTER — Ambulatory Visit (HOSPITAL_COMMUNITY): Admission: RE | Admit: 2023-12-04 | Source: Home / Self Care | Admitting: Obstetrics and Gynecology

## 2023-12-04 SURGERY — INSERTION, SACRAL NERVE STIMULATOR, INTERSTIM, STAGE 1
Anesthesia: Monitor Anesthesia Care

## 2023-12-05 ENCOUNTER — Ambulatory Visit (HOSPITAL_BASED_OUTPATIENT_CLINIC_OR_DEPARTMENT_OTHER): Payer: Self-pay | Admitting: Physical Therapy

## 2023-12-05 ENCOUNTER — Encounter (HOSPITAL_BASED_OUTPATIENT_CLINIC_OR_DEPARTMENT_OTHER): Payer: Self-pay

## 2023-12-07 ENCOUNTER — Encounter (HOSPITAL_BASED_OUTPATIENT_CLINIC_OR_DEPARTMENT_OTHER)

## 2023-12-07 ENCOUNTER — Other Ambulatory Visit (HOSPITAL_BASED_OUTPATIENT_CLINIC_OR_DEPARTMENT_OTHER): Payer: Self-pay

## 2023-12-07 ENCOUNTER — Encounter (HOSPITAL_BASED_OUTPATIENT_CLINIC_OR_DEPARTMENT_OTHER): Payer: Self-pay | Admitting: Orthopaedic Surgery

## 2023-12-07 DIAGNOSIS — L570 Actinic keratosis: Secondary | ICD-10-CM | POA: Diagnosis not present

## 2023-12-07 DIAGNOSIS — L821 Other seborrheic keratosis: Secondary | ICD-10-CM | POA: Diagnosis not present

## 2023-12-07 DIAGNOSIS — L814 Other melanin hyperpigmentation: Secondary | ICD-10-CM | POA: Diagnosis not present

## 2023-12-07 DIAGNOSIS — D1801 Hemangioma of skin and subcutaneous tissue: Secondary | ICD-10-CM | POA: Diagnosis not present

## 2023-12-07 DIAGNOSIS — C44319 Basal cell carcinoma of skin of other parts of face: Secondary | ICD-10-CM | POA: Diagnosis not present

## 2023-12-07 MED ORDER — FLUOROURACIL 5 % EX CREA
TOPICAL_CREAM | CUTANEOUS | 0 refills | Status: DC
  Filled 2023-12-07 – 2023-12-19 (×2): qty 40, 30d supply, fill #0

## 2023-12-10 ENCOUNTER — Ambulatory Visit (HOSPITAL_BASED_OUTPATIENT_CLINIC_OR_DEPARTMENT_OTHER): Admitting: Physical Therapy

## 2023-12-17 ENCOUNTER — Other Ambulatory Visit (HOSPITAL_BASED_OUTPATIENT_CLINIC_OR_DEPARTMENT_OTHER): Payer: Self-pay

## 2023-12-17 ENCOUNTER — Encounter (HOSPITAL_BASED_OUTPATIENT_CLINIC_OR_DEPARTMENT_OTHER): Admitting: Physical Therapy

## 2023-12-18 ENCOUNTER — Ambulatory Visit
Admission: RE | Admit: 2023-12-18 | Discharge: 2023-12-18 | Disposition: A | Discharge status: Home / Self Care | Source: Ambulatory Visit | Attending: Orthopaedic Surgery

## 2023-12-18 DIAGNOSIS — M7918 Myalgia, other site: Secondary | ICD-10-CM

## 2023-12-18 DIAGNOSIS — M4316 Spondylolisthesis, lumbar region: Secondary | ICD-10-CM | POA: Diagnosis not present

## 2023-12-18 DIAGNOSIS — M25551 Pain in right hip: Secondary | ICD-10-CM | POA: Diagnosis not present

## 2023-12-18 DIAGNOSIS — M4726 Other spondylosis with radiculopathy, lumbar region: Secondary | ICD-10-CM | POA: Diagnosis not present

## 2023-12-18 DIAGNOSIS — M7061 Trochanteric bursitis, right hip: Secondary | ICD-10-CM | POA: Diagnosis not present

## 2023-12-18 DIAGNOSIS — M25552 Pain in left hip: Secondary | ICD-10-CM | POA: Diagnosis not present

## 2023-12-18 NOTE — Telephone Encounter (Signed)
 Surgery rescheduled. KD

## 2023-12-19 ENCOUNTER — Encounter: Payer: Self-pay | Admitting: Obstetrics and Gynecology

## 2023-12-19 ENCOUNTER — Ambulatory Visit (HOSPITAL_BASED_OUTPATIENT_CLINIC_OR_DEPARTMENT_OTHER): Admitting: Orthopaedic Surgery

## 2023-12-19 ENCOUNTER — Other Ambulatory Visit (HOSPITAL_BASED_OUTPATIENT_CLINIC_OR_DEPARTMENT_OTHER): Payer: Self-pay

## 2023-12-19 ENCOUNTER — Encounter (HOSPITAL_COMMUNITY): Payer: Self-pay | Admitting: Obstetrics and Gynecology

## 2023-12-19 ENCOUNTER — Encounter (HOSPITAL_BASED_OUTPATIENT_CLINIC_OR_DEPARTMENT_OTHER): Payer: Self-pay | Admitting: Orthopaedic Surgery

## 2023-12-19 ENCOUNTER — Encounter (HOSPITAL_BASED_OUTPATIENT_CLINIC_OR_DEPARTMENT_OTHER)

## 2023-12-19 DIAGNOSIS — M5136 Other intervertebral disc degeneration, lumbar region with discogenic back pain only: Secondary | ICD-10-CM | POA: Diagnosis not present

## 2023-12-19 NOTE — Progress Notes (Signed)
 Chief Complaint: Lower back pain, left scapular pain     History of Present Illness:   12/19/2023: Presents today for follow-up of her back and lower hips.  Vanessa Trujillo is a 83 y.o. female presents with issues with her left posterior shoulder as well as the left lower back.  She states that she is concerned of a history of lumbar stenosis as her mother suffered this.  She does have pain that radiates down the posterior aspect of both buttocks as well as into both SI joints.  With regard to the left shoulder she is experiencing pain at the inferior angle of the left scapula.  She states this feels like internal shingles although her testing for this was negative.  She has been trialing naproxen with some relief.  She has not had any physical therapy    PMH/PSH/Family History/Social History/Meds/Allergies:    Past Medical History:  Diagnosis Date   Breast cyst    Aspirated   Chest pain    reaction to Codeine   Chronic UTI    Fibroid    GERD (gastroesophageal reflux disease)    Hematuria    Hepatitis    age 10, hospitalized   History of hiatal hernia    large per 06/05/23 CT A/P in Epic   History of kidney stones    Nonpuerperal mastitis 05/1990   OAB (overactive bladder)    Follows w/ [facility].   PONV (postoperative nausea and vomiting)    TMJ (sprain of temporomandibular joint)    around 2018   Past Surgical History:  Procedure Laterality Date   APPENDECTOMY  07/10/1957   BREAST CYST ASPIRATION     CATARACT EXTRACTION Right 09/08/2015   Dr. Danley Dusky   CHOLECYSTECTOMY  07/10/1986   CYSTOSCOPY N/A 07/16/2023   Procedure: CYSTOSCOPY with hydrodistention and intravesical botox  injection (100 units);  Surgeon: Arma Lamp, MD;  Location: [facility];  Service: Gynecology;  Laterality: N/A;   Hysteroscopic resection  04/10/1999   small fibroid polyps   LITHOTRIPSY     TONSILLECTOMY AND ADENOIDECTOMY  07/10/1944   TUBAL LIGATION   07/10/1977   BTL   Social History   Socioeconomic History   Marital status: Widowed    Spouse name: Not on file   Number of children: Not on file   Years of education: Not on file   Highest education level: Not on file  Occupational History   Occupation: Retired.  Tobacco Use   Smoking status: Never   Smokeless tobacco: Never  Vaping Use   Vaping status: Never Used  Substance and Sexual Activity   Alcohol use: No   Drug use: No   Sexual activity: Not Currently    Partners: Male    Birth control/protection: Post-menopausal  Other Topics Concern   Not on file  Social History Narrative   Pt widowed since 2012, lives alone.   Social Drivers of Corporate investment banker Strain: Not on file  Food Insecurity: Not on file  Transportation Needs: Not on file  Physical Activity: Not on file  Stress: Not on file  Social Connections: Not on file   Family History  Problem Relation Age of Onset   Breast cancer Mother 56   Heart failure Father    Emphysema Father        Died age 64   Breast cancer Maternal Grandmother 84   Allergies  Allergen Reactions   Codeine     Chest pain  Atorvastatin     Other reaction(s): muscle   Pravastatin     Other reaction(s): muscle pain   Rosuvastatin Calcium     Other reaction(s): muscle pain   Zoster Vac Recomb Adjuvanted Other (See Comments)    Shingles vaccine - very painful muscles and joints aches   Current Outpatient Medications  Medication Sig Dispense Refill   sulfamethoxazole -trimethoprim  (BACTRIM ) 400-80 MG tablet Take 0.5 tablets by mouth daily. 50 tablet 0   acetaminophen  (TYLENOL ) 500 MG tablet Take 1 tablet (500 mg total) by mouth every 6 (six) hours as needed (pain). 30 tablet 0   Ascorbic Acid (VITAMIN C ) 1000 MG tablet Take 1 tablet (1,000 mg total) by mouth in the morning, at noon, in the evening, and at bedtime. (Patient taking differently: Take 1,000 mg by mouth in the morning and at bedtime.) 120 tablet 2    citalopram  (CELEXA ) 20 MG tablet Take 1 tablet (20 mg total) by mouth daily. 90 tablet 3   Estradiol  10 MCG TABS vaginal tablet PLACE 1 TABLET VAGINALLY 2 TIMES A WEEK. 24 tablet 3   Evolocumab  (REPATHA  SURECLICK) 140 MG/ML SOAJ Inject 140 mg into the skin every 14 (fourteen) days. 6 mL 3   fluorouracil  (EFUDEX ) 5 % cream Apply a small amount twice a day as directed to affected areas on face x 2 weeks 40 g 0   ibuprofen  (ADVIL ) 600 MG tablet Take 1 tablet (600 mg total) by mouth every 6 (six) hours as needed. 30 tablet 0   Multiple Vitamins-Minerals (PRESERVISION AREDS PO) Take by mouth.     NONFORMULARY OR COMPOUNDED ITEM Amitriptyline 2.5%/ gabapentin 2.5%/ baclofen 2.5% in vaginal cream.  Place 1g daily in vaginal/ vulvar area.  Dispense 60g with 5 refills. 60 each 5   omeprazole  (PRILOSEC) 10 MG capsule Take 1 capsule (10 mg total) by mouth daily. 90 capsule 4   polyethylene glycol powder (GLYCOLAX /MIRALAX ) 17 GM/SCOOP powder Take 17 g by mouth daily. Drink 17g (1 scoop) dissolved in water  per day. 238 g 0   Probiotic Product (ALIGN) 4 MG CAPS      vitamin B-12 (CYANOCOBALAMIN ) 100 MCG tablet Take 100 mcg by mouth daily.     No current facility-administered medications for this visit.   MR HIP LEFT WO CONTRAST Result Date: 12/18/2023 EXAM DESCRIPTION: MR HIP LEFT WO CONTRAST CLINICAL HISTORY: Hip pain, chronic, labral tear suspected, xray done COMPARISON: None Available. TECHNIQUE: MRI of the hip is performed according to our usual protocol with multiplanar multi sequence imaging. FINDINGS: No fracture. No erosions. The sacroiliac joints are unremarkable. No significant degenerative change left hip. No labral tear. Trace greater trochanteric bursal fluid. No significant joint effusion. The tendons and musculature are unremarkable. No subcutaneous edema. IMPRESSION: Unremarkable left hip. Trace greater trochanteric bursal fluid. Electronically signed by: Basilio Both MD 12/18/2023 04:50 PM EDT  RP Workstation: [id number]   MR HIP RIGHT WO CONTRAST Result Date: 12/18/2023 EXAM DESCRIPTION: MR HIP RIGHT WO CONTRAST CLINICAL HISTORY: Hip pain, chronic, labral tear suspected, xray done COMPARISON: None Available. TECHNIQUE: MRI of the hip is performed according to our usual protocol with multiplanar multi sequence imaging. FINDINGS: No fracture. No erosions. The sacroiliac joints are unremarkable. No significant degenerative changes to the hips. The marrow signal is unremarkable. No subcutaneous edema. There is mild linear signal adjacent to the superior labrum which I believe is vascular rather than a linear tear. Mild greater trochanteric bursal fluid on the right. Mild chronic-appearing partial tear of the proximal right  hamstring. The tendons and musculature are otherwise unremarkable. IMPRESSION: Mild greater trochanteric bursal fluid on the right. Mild chronic-appearing partial tear proximal right hamstring. Electronically signed by: Basilio Both MD 12/18/2023 04:38 PM EDT RP Workstation: [id number]    Review of Systems:   A ROS was performed including pertinent positives and negatives as documented in the HPI.  Physical Exam :   Constitutional: NAD and appears stated age Neurological: Alert and oriented Psych: Appropriate affect and cooperative Last menstrual period 07/11/1999.   Comprehensive Musculoskeletal Exam:    Right hip with tenderness about the greater trochanter as well as the left as well.  She does have a significant Trendelenburg gait bilaterally.  Range of motion of both hips is to 30 degrees internal/external rotation without pain.  There is pain with resisted abduction bilaterally positive bilateral pain about the SI joints.   Imaging:   Xray (lumbar spine 4 views): Bilateral SI arthritis  MRI lumbar spine, bilateral hips: There is L3 on L4 retrolisthesis with bilateral neuroforaminal herniation   I personally reviewed and interpreted the  radiographs.   Assessment and Plan:   83 y.o. female with evidence of L3 on L4 retrolisthesis with bilateral neuroforaminal disc herniations which I do believe is causing much of her back pain and radiating pain.  At this time I have advised that I would like to start with an injection agrees for imaging for bilateral L3-L4 neuroforaminal injections to assess how much of her symptoms are truly coming from this.  I do believe it would be beneficial to hold off on her nerve stimulator until we can work this up diagnostically and I would also like her to see my partner Dr. Sulema Endo and spinal surgery for at a minimum a surgical discussion of the possibilities  -Return to clinic 4 weeks for reassessment   I personally saw and evaluated the patient, and participated in the management and treatment plan.  Wilhelmenia Harada, MD Attending Physician, Orthopedic Surgery  This document was dictated using Dragon voice recognition software. A reasonable attempt at proof reading has been made to minimize errors.

## 2023-12-19 NOTE — Progress Notes (Signed)
 Had left message with patient today to completed pre-op interview for surgery scheduled on 12-24-2023 by Dr Frutoso Jing.  Patient called me back and stated that her surgery will need to be postponed due to something found with her back (spine) and that she has already called and notified Dr Sunnie England office.

## 2023-12-21 ENCOUNTER — Other Ambulatory Visit

## 2023-12-21 ENCOUNTER — Telehealth (HOSPITAL_BASED_OUTPATIENT_CLINIC_OR_DEPARTMENT_OTHER): Payer: Self-pay | Admitting: Orthopaedic Surgery

## 2023-12-21 NOTE — Telephone Encounter (Signed)
 Patient states she has questions about her back injection  at [facility]. Best contact [phone]

## 2023-12-21 NOTE — Telephone Encounter (Signed)
 Patient returned call asking why you sent the injection referral to GI versus Dr. Daisey Dryer. I explained that you wanted her to receive this injection ASAP and they have better availability versus Dr. Daisey Dryer, however they are great providers there as well. Patient verbally understood and there were no further questions

## 2023-12-21 NOTE — Telephone Encounter (Signed)
 LMOM to return call.

## 2023-12-24 ENCOUNTER — Encounter (HOSPITAL_BASED_OUTPATIENT_CLINIC_OR_DEPARTMENT_OTHER)

## 2023-12-24 ENCOUNTER — Ambulatory Visit (HOSPITAL_COMMUNITY): Admission: RE | Admit: 2023-12-24 | Source: Home / Self Care | Admitting: Obstetrics and Gynecology

## 2023-12-24 HISTORY — DX: Allergic rhinitis, unspecified: J30.9

## 2023-12-24 HISTORY — DX: Unspecified macular degeneration: H35.30

## 2023-12-24 HISTORY — DX: Personal history of other infectious and parasitic diseases: Z86.19

## 2023-12-24 HISTORY — DX: Leiomyoma of uterus, unspecified: D25.9

## 2023-12-24 HISTORY — DX: Urge incontinence: N39.41

## 2023-12-24 HISTORY — DX: Other intervertebral disc degeneration, lumbar region without mention of lumbar back pain or lower extremity pain: M51.369

## 2023-12-24 HISTORY — DX: Vitamin D deficiency, unspecified: E55.9

## 2023-12-24 HISTORY — DX: Deficiency of other specified B group vitamins: E53.8

## 2023-12-24 HISTORY — DX: Hyperlipidemia, unspecified: E78.5

## 2023-12-24 HISTORY — DX: Diverticulosis of large intestine without perforation or abscess without bleeding: K57.30

## 2023-12-24 HISTORY — DX: Other specified disorders of bone density and structure, unspecified site: M85.80

## 2023-12-24 HISTORY — DX: Diaphragmatic hernia without obstruction or gangrene: K44.9

## 2023-12-24 HISTORY — DX: Atherosclerosis of aorta: I70.0

## 2023-12-24 HISTORY — DX: Personal history of adenomatous and serrated colon polyps: Z86.0101

## 2023-12-24 SURGERY — INSERTION, SACRAL NERVE STIMULATOR, INTERSTIM, STAGE 1
Anesthesia: Monitor Anesthesia Care

## 2023-12-26 DIAGNOSIS — C44319 Basal cell carcinoma of skin of other parts of face: Secondary | ICD-10-CM | POA: Diagnosis not present

## 2023-12-27 ENCOUNTER — Encounter (HOSPITAL_BASED_OUTPATIENT_CLINIC_OR_DEPARTMENT_OTHER)

## 2023-12-27 NOTE — Discharge Instructions (Signed)

## 2023-12-28 ENCOUNTER — Ambulatory Visit
Admission: RE | Admit: 2023-12-28 | Discharge: 2023-12-28 | Disposition: A | Discharge status: Home / Self Care | Source: Ambulatory Visit | Attending: Orthopaedic Surgery | Admitting: Orthopaedic Surgery

## 2023-12-28 DIAGNOSIS — M5136 Other intervertebral disc degeneration, lumbar region with discogenic back pain only: Secondary | ICD-10-CM

## 2023-12-28 DIAGNOSIS — M4727 Other spondylosis with radiculopathy, lumbosacral region: Secondary | ICD-10-CM | POA: Diagnosis not present

## 2023-12-28 DIAGNOSIS — M5116 Intervertebral disc disorders with radiculopathy, lumbar region: Secondary | ICD-10-CM | POA: Diagnosis not present

## 2023-12-28 MED ORDER — METHYLPREDNISOLONE ACETATE 40 MG/ML INJ SUSP (RADIOLOG
80.0000 mg | Freq: Once | INTRAMUSCULAR | Status: AC
Start: 2023-12-28 — End: 2023-12-28
  Administered 2023-12-28: 80 mg via EPIDURAL

## 2023-12-28 MED ORDER — IOPAMIDOL (ISOVUE-M 200) INJECTION 41%
1.0000 mL | Freq: Once | INTRAMUSCULAR | Status: AC
Start: 2023-12-28 — End: 2023-12-28
  Administered 2023-12-28: 1 mL via EPIDURAL

## 2023-12-31 ENCOUNTER — Ambulatory Visit (HOSPITAL_BASED_OUTPATIENT_CLINIC_OR_DEPARTMENT_OTHER): Admitting: Physical Therapy

## 2024-01-02 IMAGING — DX DG CHEST 2V
2 series · 2 of 2 positions shown · non-contrast
Comparison: June 16, 2016.

CLINICAL DATA: Chest pain.

EXAM:
CHEST - 2 VIEW

[dg chest 2 view (1 of 2)]
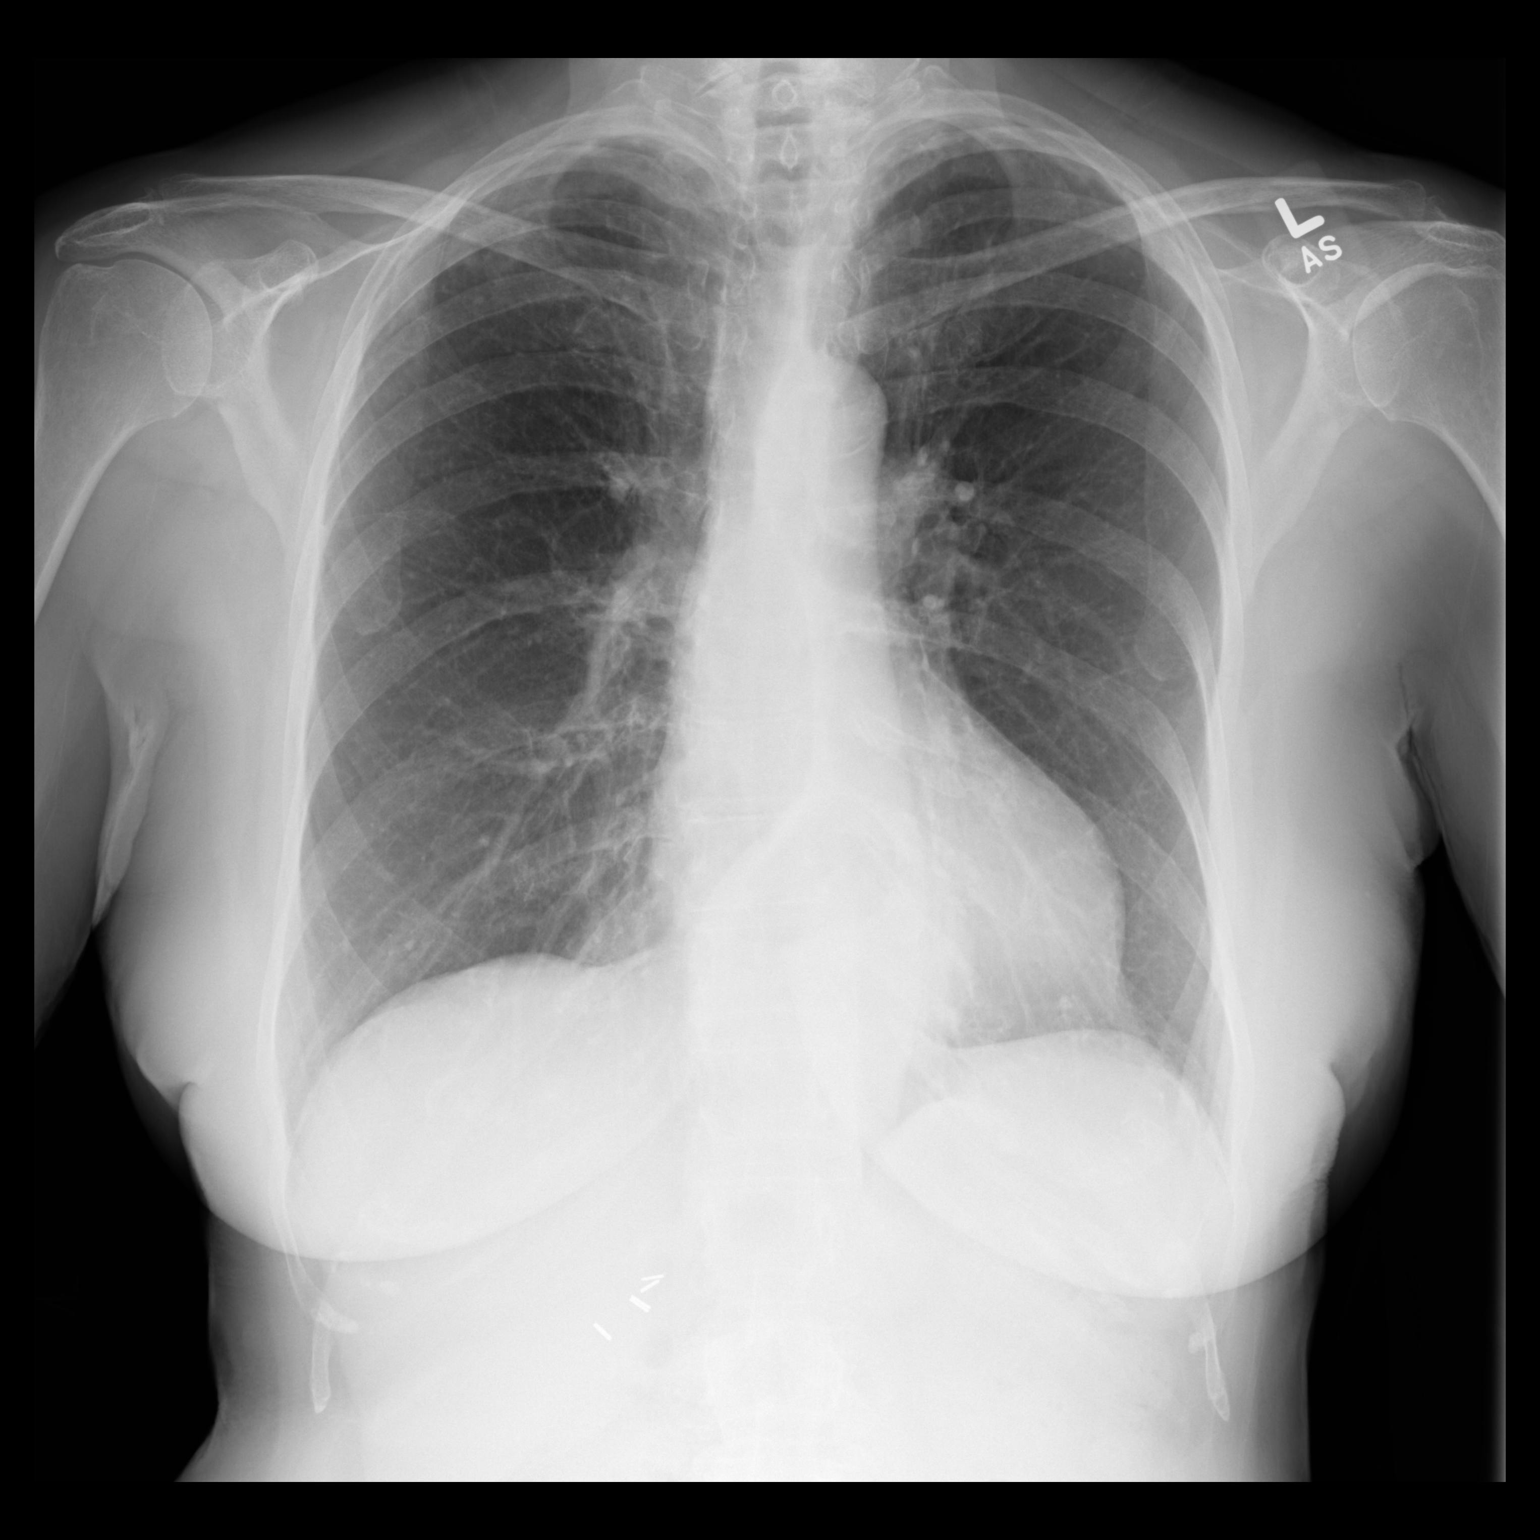

[dg chest 2 view (2 of 2)]
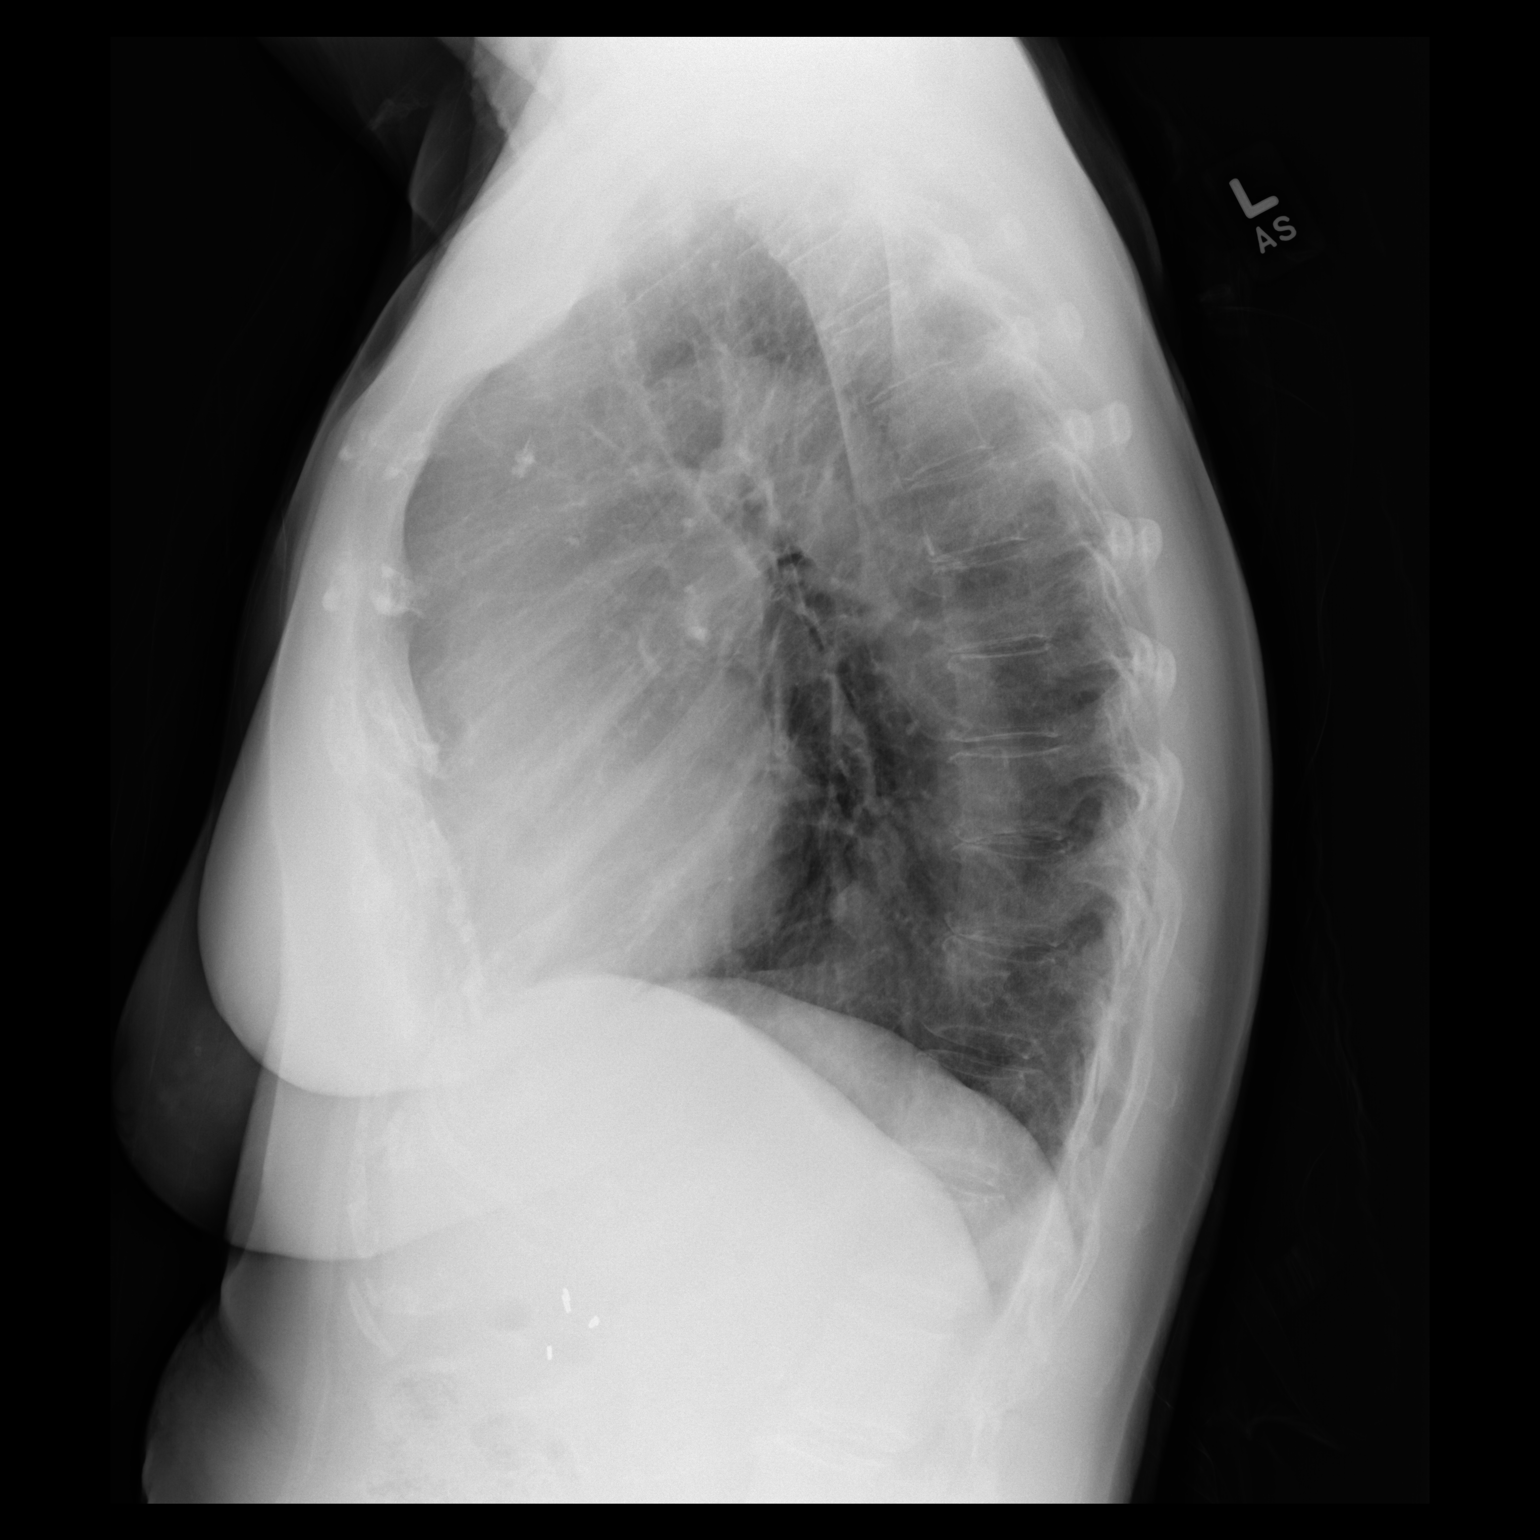

[2 of 2 positions shown; findings below may reference images not displayed]

FINDINGS: The heart size and mediastinal contours are within normal limits.
Moderate size sliding-type hiatal hernia is noted. Both lungs are
clear. The visualized skeletal structures are unremarkable.
IMPRESSION: No active cardiopulmonary disease. Moderate size sliding-type hiatal
hernia.

## 2024-01-03 ENCOUNTER — Encounter (HOSPITAL_BASED_OUTPATIENT_CLINIC_OR_DEPARTMENT_OTHER)

## 2024-01-07 ENCOUNTER — Encounter (HOSPITAL_BASED_OUTPATIENT_CLINIC_OR_DEPARTMENT_OTHER): Admitting: Physical Therapy

## 2024-01-08 DIAGNOSIS — H40023 Open angle with borderline findings, high risk, bilateral: Secondary | ICD-10-CM | POA: Diagnosis not present

## 2024-01-08 DIAGNOSIS — Z961 Presence of intraocular lens: Secondary | ICD-10-CM | POA: Diagnosis not present

## 2024-01-09 ENCOUNTER — Encounter (HOSPITAL_COMMUNITY): Admission: RE | Payer: Self-pay | Source: Home / Self Care

## 2024-01-09 ENCOUNTER — Encounter (HOSPITAL_BASED_OUTPATIENT_CLINIC_OR_DEPARTMENT_OTHER)

## 2024-01-09 ENCOUNTER — Ambulatory Visit (HOSPITAL_COMMUNITY): Admission: RE | Admit: 2024-01-09 | Source: Home / Self Care | Admitting: Obstetrics and Gynecology

## 2024-01-09 SURGERY — INSERTION, SACRAL NERVE STIMULATOR, INTERSTIM, STAGE 2
Anesthesia: General

## 2024-01-10 ENCOUNTER — Ambulatory Visit: Attending: Obstetrics and Gynecology | Admitting: Physical Therapy

## 2024-01-10 ENCOUNTER — Other Ambulatory Visit: Payer: Self-pay

## 2024-01-10 ENCOUNTER — Encounter: Payer: Self-pay | Admitting: Physical Therapy

## 2024-01-10 DIAGNOSIS — M6281 Muscle weakness (generalized): Secondary | ICD-10-CM | POA: Insufficient documentation

## 2024-01-10 DIAGNOSIS — R279 Unspecified lack of coordination: Secondary | ICD-10-CM | POA: Insufficient documentation

## 2024-01-10 DIAGNOSIS — Z1231 Encounter for screening mammogram for malignant neoplasm of breast: Secondary | ICD-10-CM | POA: Diagnosis not present

## 2024-01-10 NOTE — Therapy (Signed)
 OUTPATIENT PHYSICAL THERAPY FEMALE PELVIC EVALUATION   Patient Name: Vanessa Trujillo MRN: [id number] DOB:1940/12/12, 83 y.o., female Today's Date: 01/10/2024  END OF SESSION:  PT End of Session - 01/10/24 1618     Visit Number 1    Number of Visits 10    Date for PT Re-Evaluation 02/07/24    Authorization Type [employer]    PT Start Time 1015    PT Stop Time 1100    PT Time Calculation (min) 45 min    Activity Tolerance Patient tolerated treatment well    Behavior During Therapy WFL for tasks assessed/performed          Past Medical History:  Diagnosis Date   Allergic rhinitis    Aortic atherosclerosis (HCC)    Chronic UTI    DDD (degenerative disc disease), lumbar    Diverticulosis of colon    GERD (gastroesophageal reflux disease)    Hematuria    Hiatal hernia    large per 06/05/23 CT A/P in Epic   History of adenomatous polyp of colon    History of hepatitis    age 15, hospitalized   History of kidney stones    Hyperlipidemia    results in epic;   NUC 06-18-2016  LR w/ nuclear ef 82%;  echo 06-18-2026 normal w/ ef 60-65%;  CTC 08-21-2018  calcium score= 0.9, LR   Macular degeneration of both eyes    OAB (overactive bladder)    Follows w/ [facility].   Osteopenia    PONV (postoperative nausea and vomiting)    TMJ (sprain of temporomandibular joint)    around 2018   Urge incontinence    Uterine fibroid    Vitamin B12 deficiency    Vitamin D  deficiency    Past Surgical History:  Procedure Laterality Date   APPENDECTOMY  1959   CATARACT EXTRACTION W/ INTRAOCULAR LENS IMPLANT Right 09/08/2015   Dr. Camillo   CHOLECYSTECTOMY  1989   COLONOSCOPY WITH PROPOFOL   04/08/2012   dr donnald   CYSTOSCOPY N/A 07/16/2023   Procedure: CYSTOSCOPY with hydrodistention and intravesical botox  injection (100 units);  Surgeon: Marilynne Rosaline SAILOR, MD;  Location: [facility];  Service: Gynecology;  Laterality: N/A;   DILATATION &  CURRETTAGE/HYSTEROSCOPY WITH RESECTOCOPE  04/12/1999   @ [facility] by dr fredrik sharps;    polypectomy   LITHOTRIPSY     TONSILLECTOMY AND ADENOIDECTOMY  1946   TUBAL LIGATION Bilateral 1979   Patient Active Problem List   Diagnosis Date Noted   Medication monitoring encounter 08/09/2022   Overactive bladder 10/18/2021   Recurrent UTI 10/18/2021   Depression 06/17/2016   Gastroesophageal reflux disease without esophagitis    Chest pain at rest 06/16/2016   Cough 06/16/2016   Hyperglycemia 06/16/2016   Calculus of kidney 04/16/2013   Urge incontinence 04/16/2013   Urinary urgency 04/16/2013   Infection of urinary tract 04/16/2013    PCP: Dwight Trula SQUIBB, MD  REFERRING PROVIDER: Zuleta, Kaitlin G, NP   REFERRING DIAG: N81.10 (ICD-10-CM) - Prolapse of anterior vaginal wall N32.81 (ICD-10-CM) - Overactive bladder  THERAPY DIAG:  Muscle weakness (generalized)  Unspecified lack of coordination  Rationale for Evaluation and Treatment: Rehabilitation  ONSET DATE: last year  SUBJECTIVE:  SUBJECTIVE STATEMENT: Patient reports to PFPT with overactive bladder/prolapse symptoms. At the end of last year, her lower back was in a lot of pain and she has been dealing with this since in other PT. She has had injections for her back pain but they do not last. She was scheduled for a sacral stimulator placement yesterday, but that has been cancelled until her back pain is under more control. She deals with urinary incontinence on a regular basis - she has tried pelvic PT, tibial nerve stimulation, botox , and other forms of treatment for her incontinence and has seen no improvement.   PAIN:  Are you having pain? Yes NPRS scale: 7/10 Pain location: low back pain - no pelvic pain at rest  PRECAUTIONS: None  RED  FLAGS: None   WEIGHT BEARING RESTRICTIONS: No  FALLS:  Has patient fallen in last 6 months? No  OCCUPATION: retired  ACTIVITY LEVEL : would like to get back to a regular routine after her back pain is resolved some - enjoys going to [employer] and working on machines. She enjoys walking her dog daily and she has had to slow down.   PLOF: Independent with basic ADLs  PATIENT GOALS: decrease urinary incontinence and address prolapse symptoms   PERTINENT HISTORY:  Is being currently considered to undergo sacral nerve stimulator insertion stage 1 once she learns more about back pain   BOWEL MOVEMENT: no issues with bowel movements  Pain with bowel movement: No Type of bowel movement:Type (Bristol Stool Scale) 4, Frequency within normal limits , Strain no, and Splinting no Fully empty rectum: Yes:   Leakage: No Pads: No Fiber supplement/laxative No  URINATION: Pain with urination: No Fully empty bladder: Nosometimes she feels like there is more that can come out  Stream: Strong Urgency: Yes - but it has changed over the years - urgency comes on more suddenly  Frequency: more than within normal limits - doesn't get up to void anymore - wears depends in the nighttime  Leakage: Walking to the bathroom, Coughing, Exercise, Lifting, and Bending forward Pads: Yes: wears depends at night and wakes up with them soaked - wears pads during the day, changes it 6-8 times per day  Has a hx of chronic UTIs that are managed by medications.  INTERCOURSE: not currently sexually active   PREGNANCY: Vaginal deliveries 1 Episiotomy Yes   PROLAPSE: Pressure  OBJECTIVE:  Note: Objective measures were completed at Evaluation unless otherwise noted.  PATIENT SURVEYS:  PFIQ-7: 42  COGNITION: Overall cognitive status: Within functional limits for tasks assessed     SENSATION: Light touch: Appears intact  LUMBAR SPECIAL TESTS:  Single leg stance test: Positive  FUNCTIONAL TESTS:  Squat:  only able to reach 50% of squat depth, general lumbopelvic stiffness present, bilateral dynamic knee valgus with loading   GAIT: Assistive device utilized: none Comments: mild trendelenburg gait pattern with ambulation   POSTURE: increased thoracic kyphosis  LUMBARAROM/PROM:   A/PROM A/PROM  eval  Flexion 50% limited   Extension 50% limited  Right lateral flexion 25% limited  Left lateral flexion 25% limited  Right rotation 25% limited  Left rotation 25% limited   (Blank rows = not tested)  LOWER EXTREMITY ROM: within functional limits   LOWER EXTREMITY MMT: 4/5 bilateral knees and hips grossly   PALPATION:   General: no significant tenderness to palpation of bilateral adductors or hip flexors   Pelvic Alignment: within normal limits   Abdominal: upper chest breathing, abdominal bracing at rest, decreased  lower rib excursion with inhalation                 External Perineal Exam: internal vaginal exam deferred today due to time limitations, will assess at follow up                              Internal Pelvic Floor:  internal vaginal exam deferred today due to time limitations, will assess at follow up   Patient confirms identification and approves PT to assess internal pelvic floor and treatment Yes No emotional/communication barriers or cognitive limitation. Patient is motivated to learn. Patient understands and agrees with treatment goals and plan. PT explains patient will be examined in standing, sitting, and lying down to see how their muscles and joints work. When they are ready, they will be asked to remove their underwear so PT can examine their perineum. The patient is also given the option of providing their own chaperone as one is not provided in our facility. The patient also has the right and is explained the right to defer or refuse any part of the evaluation or treatment including the internal exam. With the patient's consent, PT will use one gloved finger to gently  assess the muscles of the pelvic floor, seeing how well it contracts and relaxes and if there is muscle symmetry. After, the patient will get dressed and PT and patient will discuss exam findings and plan of care. PT and patient discuss plan of care, schedule, attendance policy and HEP activities.   PELVIC MMT:  internal vaginal exam deferred today due to time limitations, will assess at follow up    MMT eval  Vaginal   Internal Anal Sphincter   External Anal Sphincter   Puborectalis   Diastasis Recti   (Blank rows = not tested)  TONE:  internal vaginal exam deferred today due to time limitations, will assess at follow up   PROLAPSE:  internal vaginal exam deferred today due to time limitations, will assess at follow up   TODAY'S TREATMENT:                                                                                                                              DATE:   EVAL 01/10/24: Examination completed, findings reviewed, pt educated on POC, HEP, and self care: Pt motivated to participate in PT and agreeable to attempt recommendations.  Neuro re-ed: Toileting mechanics for optimal emptying of bladder/bowels  Blow as you go technique for relieving pelvic strain with daily tasks   PATIENT EDUCATION:  Education details: relative anatomy and the connection between the diaphragm and pelvic floor, bladder irritants and water  intake recommendations  Person educated: Patient Education method: Explanation, Demonstration, Tactile cues, Verbal cues, and Handouts Education comprehension: verbalized understanding, returned demonstration, verbal cues required, tactile cues required, and needs further education  HOME EXERCISE PROGRAM: Access Code: [email or web address] URL: [email or web address] Date: 01/10/2024 Prepared  by: Celena Domino  Exercises - Supine Butterfly Groin Stretch  - 1 x daily - 7 x weekly - 2 sets - hold - Supine Lower Trunk Rotation  - 1 x daily - 7 x weekly - 2  sets - hold - Supine Single Knee to Chest Stretch  - 1 x daily - 7 x weekly - 2 sets - hold - Supine Figure 4 Piriformis Stretch  - 1 x daily - 7 x weekly - 2 sets - hold  ASSESSMENT:  CLINICAL IMPRESSION: Patient is a 83 y.o. female  who was seen today for physical therapy evaluation and treatment for vaginal prolapse and urinary incontinence.  At the end of last year, her lower back was in a lot of pain and she has been dealing with this since in other PT. She has had injections for her back pain but they do not last. She was scheduled for a sacral stimulator placement yesterday, but that has been cancelled until her back pain is under more control. She deals with urinary incontinence on a regular basis - she has tried pelvic PT, tibial nerve stimulation, botox , and other forms of treatment for her incontinence and has seen no improvement. Due to time limitations today, we only reviewed lifestyle/habitual techniques to improve bowel and bladder emptying and techniques to limit pelvic floor strain against prolapse. She reports thorough understanding of all techniques introduced today. She demonstrates generalized lumbopelvic weakness, lack of coordination between the diaphragm and pelvic floor. Overall, patient tolerated session well and Pt would benefit from additional PT to further address deficits.    OBJECTIVE IMPAIRMENTS: decreased coordination, decreased endurance, decreased mobility, decreased ROM, and decreased strength.   ACTIVITY LIMITATIONS: continence  PARTICIPATION LIMITATIONS: community activity  PERSONAL FACTORS: Age, Past/current experiences, and Time since onset of injury/illness/exacerbation are also affecting patient's functional outcome.   REHAB POTENTIAL: Good  CLINICAL DECISION MAKING: Stable/uncomplicated  EVALUATION COMPLEXITY: Low   GOALS: Goals reviewed with patient? Yes  SHORT TERM GOALS: Target date: 02/07/2024  Pt will be independent with HEP.   Baseline: Goal status: INITIAL  2.  Pt will be independent with diaphragmatic breathing and down training activities in order to improve pelvic floor relaxation. Baseline:  Goal status: INITIAL  3.  Pt will be independent with the knack, urge suppression technique, and double voiding in order to improve bladder habits and decrease urinary incontinence.   Baseline:  Goal status: INITIAL  4.  Pt will have 50% reduced leakage during a typical day  Baseline:  Goal status: INITIAL  LONG TERM GOALS: Target date: 07/12/2024  Pt will be independent with advanced HEP.  Baseline:  Goal status: INITIAL  2.  Pt to demonstrate improved coordination of pelvic floor and breathing mechanics with 10# squat with appropriate synergistic patterns to decrease pain and leakage at least 75% of the time for improved ability to complete a 30 minute workout with strain at pelvic floor and symptoms.   Baseline:  Goal status: INITIAL  3.  Pt will have 75% reduced leakage during a typical day to improve quality of life and decrease pad usage throughout the day.  Baseline:  Goal status: INITIAL  4.  Pt to demonstrate at least 4+/5 bil hip strength for improved pelvic stability and functional squats without leakage.  Baseline:  Goal status: INITIAL  5.  Pt will be able to correctly perform diaphragmatic breathing and appropriate pressure management in order to prevent worsening vaginal wall laxity and improve pelvic  floor A/ROM.   Baseline:  Goal status: INITIAL  PLAN:  PT FREQUENCY: 1-2x/week  PT DURATION: 12 weeks  PLANNED INTERVENTIONS: 97110-Therapeutic exercises, 97530- Therapeutic activity, 97112- Neuromuscular re-education, 97535- Self Care, [id number]- Manual therapy, Patient/Family education, Taping, Joint mobilization, Spinal mobilization, Scar mobilization, Cryotherapy, and Moist heat  PLAN FOR NEXT SESSION: internal vaginal examination of pelvic floor musculature   Celena JAYSON Domino, PT 01/10/2024,  4:19 PM

## 2024-01-10 NOTE — Patient Instructions (Signed)
 The blow as you go technique: use this technique when you would normally want to strain, brace, or hold your breath with strenuous movement  For example, when standing up from a chair, blow air out as you stand up rather than straining in your abdomen  Apply this to other strenuous tasks in your day to day life   Toileting techniques:  How to ensure you are fully emptying your bladder: Sit all the way down on the toilet, feet flat, to ensure your pelvic floor is fully relaxed Do not push the urine out, rather, let it flow naturally  When you think you are done urinating, take 3 deep belly breaths, directing the air into your abdomen as you inhale. See if you can expel any more urine.  Double voiding technique: gently push to see if you can urinate any more to fully empty your urethra  How to breathe when passing bowel movements to ensure you are fully emptying your bowels and not making the prolapse worse: Sit all the way down on the toilet, feet flat Imagine fogging up a mirror with your breath as you exhale and gently push the stool down and out  Repeat this until you feel empty Then: Try taking 3 deep belly breaths to see if you can relax your rectum further  Try doing a potty dance to see if you can relax further also

## 2024-01-14 ENCOUNTER — Encounter (HOSPITAL_BASED_OUTPATIENT_CLINIC_OR_DEPARTMENT_OTHER): Admitting: Physical Therapy

## 2024-01-16 ENCOUNTER — Other Ambulatory Visit (HOSPITAL_BASED_OUTPATIENT_CLINIC_OR_DEPARTMENT_OTHER): Payer: Self-pay

## 2024-01-16 ENCOUNTER — Other Ambulatory Visit: Payer: Self-pay | Admitting: Obstetrics and Gynecology

## 2024-01-16 ENCOUNTER — Ambulatory Visit (HOSPITAL_BASED_OUTPATIENT_CLINIC_OR_DEPARTMENT_OTHER): Admitting: Orthopaedic Surgery

## 2024-01-16 DIAGNOSIS — N390 Urinary tract infection, site not specified: Secondary | ICD-10-CM

## 2024-01-16 DIAGNOSIS — M5136 Other intervertebral disc degeneration, lumbar region with discogenic back pain only: Secondary | ICD-10-CM | POA: Diagnosis not present

## 2024-01-16 MED ORDER — ESTRADIOL 10 MCG VA TABS
1.0000 | ORAL_TABLET | VAGINAL | 3 refills | Status: AC
  Filled 2024-01-16: qty 8, 28d supply, fill #0
  Filled 2024-03-23: qty 8, 28d supply, fill #1
  Filled 2024-04-27: qty 8, 28d supply, fill #2
  Filled 2024-05-25: qty 8, 28d supply, fill #3
  Filled 2024-07-20: qty 8, 28d supply, fill #4

## 2024-01-16 NOTE — Progress Notes (Signed)
 Chief Complaint: Lower back pain, left scapular pain     History of Present Illness:   01/16/2024: Radiating back pain.  She did get her injection a [place] imaging which did give her 1 day of essentially complete relief although this is subsequently slowly worn off and is now radiating to the lower back as well as the right leg and left leg  Vanessa Trujillo is a 83 y.o. female presents with issues with her left posterior shoulder as well as the left lower back.  She states that she is concerned of a history of lumbar stenosis as her mother suffered this.  She does have pain that radiates down the posterior aspect of both buttocks as well as into both SI joints.  With regard to the left shoulder she is experiencing pain at the inferior angle of the left scapula.  She states this feels like internal shingles although her testing for this was negative.  She has been trialing naproxen with some relief.  She has not had any physical therapy    PMH/PSH/Family History/Social History/Meds/Allergies:    Past Medical History:  Diagnosis Date   Allergic rhinitis    Aortic atherosclerosis (HCC)    Chronic UTI    DDD (degenerative disc disease), lumbar    Diverticulosis of colon    GERD (gastroesophageal reflux disease)    Hematuria    Hiatal hernia    large per 06/05/23 CT A/P in Epic   History of adenomatous polyp of colon    History of hepatitis    age 14, hospitalized   History of kidney stones    Hyperlipidemia    results in epic;   NUC 06-18-2016  LR w/ nuclear ef 82%;  echo 06-18-2026 normal w/ ef 60-65%;  CTC 08-21-2018  calcium score= 0.9, LR   Macular degeneration of both eyes    OAB (overactive bladder)    Follows w/ [facility].   Osteopenia    PONV (postoperative nausea and vomiting)    TMJ (sprain of temporomandibular joint)    around 2018   Urge incontinence    Uterine fibroid    Vitamin B12 deficiency    Vitamin D  deficiency    Past Surgical History:   Procedure Laterality Date   APPENDECTOMY  1959   CATARACT EXTRACTION W/ INTRAOCULAR LENS IMPLANT Right 09/08/2015   Dr. Camillo   CHOLECYSTECTOMY  1989   COLONOSCOPY WITH PROPOFOL   04/08/2012   dr donnald   CYSTOSCOPY N/A 07/16/2023   Procedure: CYSTOSCOPY with hydrodistention and intravesical botox  injection (100 units);  Surgeon: Marilynne Rosaline SAILOR, MD;  Location: [facility];  Service: Gynecology;  Laterality: N/A;   DILATATION & CURRETTAGE/HYSTEROSCOPY WITH RESECTOCOPE  04/12/1999   @ [facility] by dr fredrik sharps;    polypectomy   LITHOTRIPSY     TONSILLECTOMY AND ADENOIDECTOMY  1946   TUBAL LIGATION Bilateral 1979   Social History   Socioeconomic History   Marital status: Widowed    Spouse name: Not on file   Number of children: Not on file   Years of education: Not on file   Highest education level: Not on file  Occupational History   Occupation: Retired.  Tobacco Use   Smoking status: Never   Smokeless tobacco: Never  Vaping Use   Vaping status: Never Used  Substance and Sexual Activity   Alcohol use: No   Drug use: No   Sexual activity: Not Currently    Partners: Male  Birth control/protection: Post-menopausal  Other Topics Concern   Not on file  Social History Narrative   Pt widowed since 2012, lives alone.   Social Drivers of Corporate investment banker Strain: Not on file  Food Insecurity: Not on file  Transportation Needs: Not on file  Physical Activity: Not on file  Stress: Not on file  Social Connections: Not on file   Family History  Problem Relation Age of Onset   Breast cancer Mother 45   Heart failure Father    Emphysema Father        Died age 81   Breast cancer Maternal Grandmother 64   Allergies  Allergen Reactions   Codeine     Chest pain   Atorvastatin     Other reaction(s): muscle   Pravastatin     Other reaction(s): muscle pain   Rosuvastatin Calcium     Other reaction(s): muscle pain   Zoster Vac Recomb Adjuvanted  Other (See Comments)    Shingles vaccine - very painful muscles and joints aches   Current Outpatient Medications  Medication Sig Dispense Refill   acetaminophen  (TYLENOL ) 500 MG tablet Take 1 tablet (500 mg total) by mouth every 6 (six) hours as needed (pain). 30 tablet 0   Ascorbic Acid (VITAMIN C ) 1000 MG tablet Take 1 tablet (1,000 mg total) by mouth in the morning, at noon, in the evening, and at bedtime. (Patient taking differently: Take 1,000 mg by mouth in the morning and at bedtime.) 120 tablet 2   citalopram  (CELEXA ) 20 MG tablet Take 1 tablet (20 mg total) by mouth daily. 90 tablet 3   Estradiol  10 MCG TABS vaginal tablet PLACE 1 TABLET VAGINALLY 2 TIMES A WEEK. 24 tablet 3   Evolocumab  (REPATHA  SURECLICK) 140 MG/ML SOAJ Inject 140 mg into the skin every 14 (fourteen) days. 6 mL 3   fluorouracil  (EFUDEX ) 5 % cream Apply a small amount twice a day as directed to affected areas on face x 2 weeks 40 g 0   ibuprofen  (ADVIL ) 600 MG tablet Take 1 tablet (600 mg total) by mouth every 6 (six) hours as needed. 30 tablet 0   Multiple Vitamins-Minerals (PRESERVISION AREDS PO) Take by mouth.     NONFORMULARY OR COMPOUNDED ITEM Amitriptyline 2.5%/ gabapentin 2.5%/ baclofen 2.5% in vaginal cream.  Place 1g daily in vaginal/ vulvar area.  Dispense 60g with 5 refills. 60 each 5   omeprazole  (PRILOSEC) 10 MG capsule Take 1 capsule (10 mg total) by mouth daily. 90 capsule 4   polyethylene glycol powder (GLYCOLAX /MIRALAX ) 17 GM/SCOOP powder Take 17 g by mouth daily. Drink 17g (1 scoop) dissolved in water  per day. 238 g 0   Probiotic Product (ALIGN) 4 MG CAPS      sulfamethoxazole -trimethoprim  (BACTRIM ) 400-80 MG tablet Take 0.5 tablets by mouth daily. 50 tablet 0   vitamin B-12 (CYANOCOBALAMIN ) 100 MCG tablet Take 100 mcg by mouth daily.     No current facility-administered medications for this visit.   No results found.   Review of Systems:   A ROS was performed including pertinent positives and  negatives as documented in the HPI.  Physical Exam :   Constitutional: NAD and appears stated age Neurological: Alert and oriented Psych: Appropriate affect and cooperative Last menstrual period 07/11/1999.   Comprehensive Musculoskeletal Exam:    Right hip with tenderness about the greater trochanter as well as the left as well.  She does have a significant Trendelenburg gait bilaterally.  Range of motion  of both hips is to 30 degrees internal/external rotation without pain.  There is pain with resisted abduction bilaterally positive bilateral pain about the SI joints.   Imaging:   Xray (lumbar spine 4 views): Bilateral SI arthritis  MRI lumbar spine, bilateral hips: There is L3 on L4 retrolisthesis with bilateral neuroforaminal herniation   I personally reviewed and interpreted the radiographs.   Assessment and Plan:   83 y.o. female with evidence of L3 on L4 retrolisthesis with bilateral neuroforaminal disc herniations which I do believe is causing much of her back pain and radiating pain.  At this time I have advised that I would like to start with an injection agrees for imaging for bilateral L3-L4 neuroforaminal injections to assess how much of her symptoms are truly coming from this.  She has had this injection which did give her relief and she is feeling much better although this is starting to wear off.  She has been participating in physical therapy.  At this time I do ultimately believe that she would benefit from some continued physical therapy although I would also like her to see Dr. Georgina as she does have a possible plan for nerve stimulator and I would like to see if she is a candidate for surgical intervention prior to this  -Return to clinic as needed   I personally saw and evaluated the patient, and participated in the management and treatment plan.  Elspeth Parker, MD Attending Physician, Orthopedic Surgery  This document was dictated using Dragon voice  recognition software. A reasonable attempt at proof reading has been made to minimize errors.

## 2024-01-18 NOTE — Therapy (Signed)
 OUTPATIENT PHYSICAL THERAPY EVALUATION   Patient Name: Vanessa Trujillo MRN: [id number] DOB:December 18, 1940, 83 y.o., female Today's Date: 01/19/2024  END OF SESSION:  PT End of Session - 01/19/24 0837     Visit Number 2   visit 1 for ortho   Number of Visits 10    Date for PT Re-Evaluation 02/07/24    Authorization Type [employer]    PT Start Time [phone]    PT Stop Time 0913    PT Time Calculation (min) 40 min    Activity Tolerance Patient tolerated treatment well    Behavior During Therapy [facility] for tasks assessed/performed          Past Medical History:  Diagnosis Date   Allergic rhinitis    Aortic atherosclerosis (HCC)    Chronic UTI    DDD (degenerative disc disease), lumbar    Diverticulosis of colon    GERD (gastroesophageal reflux disease)    Hematuria    Hiatal hernia    large per 06/05/23 CT A/P in Epic   History of adenomatous polyp of colon    History of hepatitis    age 42, hospitalized   History of kidney stones    Hyperlipidemia    results in epic;   NUC 06-18-2016  LR w/ nuclear ef 82%;  echo 06-18-2026 normal w/ ef 60-65%;  CTC 08-21-2018  calcium score= 0.9, LR   Macular degeneration of both eyes    OAB (overactive bladder)    Follows w/ [facility].   Osteopenia    PONV (postoperative nausea and vomiting)    TMJ (sprain of temporomandibular joint)    around 2018   Urge incontinence    Uterine fibroid    Vitamin B12 deficiency    Vitamin D  deficiency    Past Surgical History:  Procedure Laterality Date   APPENDECTOMY  1959   CATARACT EXTRACTION W/ INTRAOCULAR LENS IMPLANT Right 09/08/2015   Dr. Camillo   CHOLECYSTECTOMY  1989   COLONOSCOPY WITH PROPOFOL   04/08/2012   dr donnald   CYSTOSCOPY N/A 07/16/2023   Procedure: CYSTOSCOPY with hydrodistention and intravesical botox  injection (100 units);  Surgeon: Marilynne Rosaline SAILOR, MD;  Location: [facility];  Service: Gynecology;  Laterality: N/A;   DILATATION &  CURRETTAGE/HYSTEROSCOPY WITH RESECTOCOPE  04/12/1999   @ [facility] by dr fredrik sharps;    polypectomy   LITHOTRIPSY     TONSILLECTOMY AND ADENOIDECTOMY  1946   TUBAL LIGATION Bilateral 1979   Patient Active Problem List   Diagnosis Date Noted   Medication monitoring encounter 08/09/2022   Overactive bladder 10/18/2021   Recurrent UTI 10/18/2021   Depression 06/17/2016   Gastroesophageal reflux disease without esophagitis    Chest pain at rest 06/16/2016   Cough 06/16/2016   Hyperglycemia 06/16/2016   Calculus of kidney 04/16/2013   Urge incontinence 04/16/2013   Urinary urgency 04/16/2013   Infection of urinary tract 04/16/2013    REFERRING PROVIDER: Genelle Standing, MD   REFERRING DIAG:  M53.3,G89.29 (ICD-10-CM) - Chronic left SI joint pain   Rationale for Evaluation and Treatment: Rehabilitation  THERAPY DIAG:  Muscle weakness (generalized)  Other low back pain  Chronic left shoulder pain  ONSET DATE: Dec 2024/Jan 2025   SUBJECTIVE:  SUBJECTIVE STATEMENT: Re-evaluation performed on 01/19/2024 for dx of SIJD.  Very stiff across the back. AM stiffness- not as bad as it was before the injection but still bad. What worries me the most is the right leg- like it won't support me. I can tell I have lost a lot of strength. I do my exercises every morning to decrease stiffness.  The pain in my left shoulder region is really back. Upper back tends to cramp as I am doing things.   PERTINENT HISTORY:  Chronic back pain  PAIN:  Are you having pain? Yes: NPRS scale: severe Pain location: across low back Pain description: tight Aggravating factors: AM Relieving factors: movement  PRECAUTIONS:  None  RED FLAGS: None   WEIGHT BEARING RESTRICTIONS:  No  FALLS:  Has patient fallen in last 6 months?  No   PLOF:  Independent  PATIENT GOALS:  Decrease pain   OBJECTIVE:  Note: Objective measures were completed at Evaluation unless otherwise noted.  DIAGNOSTIC FINDINGS:  MRI 6/10 IMPRESSION: Mild retrolisthesis of L3 on L4 and moderate degenerative disc disease with mild endplate marrow edema.   Multilevel degenerative spondylosis with levels described in detail above.   No disc protrusion or evidence of impingement   PATIENT SURVEYS:  Modified Oswestry:  MODIFIED OSWESTRY DISABILITY SCALE  Date: 7/12 Score  Pain intensity 4 =  Pain medication provides me with little relief from pain.  2. Personal care (washing, dressing, etc.) 2 =  It is painful to take care of myself, and I am slow and careful.  3. Lifting 4 = I can lift only very light weights  4. Walking 3 =  Pain prevents me from walking more than  mile.  5. Sitting 3 =  Pain prevents me from sitting more than  hour.  6. Standing 4 =  Pain prevents me from standing more than 10 minutes.  7. Sleeping 2 =  Even when I take pain medication, I sleep less than 6 hours  8. Social Life 3 =  Pain prevents me from going out very often.  9. Traveling 4 = My pain restricts my travel to short necessary journeys under 1/2 hour.  10. Employment/ Homemaking 3 = Pain prevents me from doing anything but light duties.  Total 32/50   Interpretation of scores: Score Category Description  0-20% Minimal Disability The patient can cope with most living activities. Usually no treatment is indicated apart from advice on lifting, sitting and exercise  21-40% Moderate Disability The patient experiences more pain and difficulty with sitting, lifting and standing. Travel and social life are more difficult and they may be disabled from work. Personal care, sexual activity and sleeping are not grossly affected, and the patient can usually be managed by conservative means  41-60% Severe Disability Pain remains the main problem in this group, but  activities of daily living are affected. These patients require a detailed investigation  61-80% Crippled Back pain impinges on all aspects of the patient's life. Positive intervention is required  81-100% Bed-bound  These patients are either bed-bound or exaggerating their symptoms  Bluford FORBES Zoe DELENA Karon DELENA, et al. Surgery versus conservative management of stable thoracolumbar fracture: the PRESTO feasibility RCT. [place] ([place]): [employer]; 2021 Nov. [facility] Technology Assessment, No. 25.62.) Appendix 3, Oswestry Disability Index category descriptors. Available from: Find[employer]el[employer]cz  Minimally Clinically Important Difference (MCID) = 12.8%  COGNITIVE STATUS: Within functional limits for tasks assessed   SENSATION: WFL   POSTURE:  Eval-  seated: shifted to the left with left shoulder elevation, bilateral rounded shoulders. Standing: Rt ASIS elevation, left scapular elevation, incr thoracic kyphosis, Rt scapular winging  GAIT: 01/19/24 Comments: Rt trendelenburg, slow cadence, lacking trunk rotation   Body Part #1 Hip    LOWER EXTREMITY MMT:     MMT (lb) Right eval Left eval Rt/Lt 4/9 Rt/Lt 5/5  Hip flexion          Hip extension          Hip abduction (SL at knee) 15.9 6.4 12.5/11.1 14.9/21.2  Hip adduction          Hip internal rotation          Hip external rotation          Knee flexion 12.0 10.2 16.4/12.2 23.9/20.5  Knee extension 26.3 11.0 32.1/12.7 29.2/21.4   (Blank rows = not tested)                                                                                                                              TREATMENT DATE:   01/19/24 Lumbar shift- left shoulder on wall hips move right to left Gait- lacking progression of left shoulder in trunk rotation Breathing with Lt lower rib cage depression- expansion of right lateral, inf ribs Lift in left shoe, 3 layer   PATIENT EDUCATION:  Education details: Anatomy  of condition, POC, HEP, exercise form/rationale Person educated: Patient Education method: Explanation, Demonstration, Tactile cues, Verbal cues, and Handouts Education comprehension: verbalized understanding, returned demonstration, verbal cues required, tactile cues required, and needs further education  HOME EXERCISE PROGRAM: Wear lift, Rt rib cage expansion/Lt anchor   ASSESSMENT:  CLINICAL IMPRESSION: Patient is a 83 y.o. F who was seen today for physical therapy re-evaluation and treatment for chronic LBP and Lt shoulder pain. Lt shoudler pain is consistent with spasm around scapula from elevated posture. Added heel lift to left shoe and began correcting lumbar shift. Pt was able to improve trunk rotation in gait today with tactile cues and will continue to work on this.     OBJECTIVE IMPAIRMENTS: Abnormal gait, decreased activity tolerance, decreased balance, decreased endurance, difficulty walking, decreased ROM, decreased strength, increased muscle spasms, improper body mechanics, and pain.   ACTIVITY LIMITATIONS: carrying, lifting, bending, sitting, standing, squatting, sleeping, stairs, transfers, bed mobility, dressing, reach over head, and locomotion level  PARTICIPATION LIMITATIONS: meal prep, cleaning, laundry, driving, shopping, community activity, and yard work  PERSONAL FACTORS: Fitness and Time since onset of injury/illness/exacerbation are also affecting patient's functional outcome.   REHAB POTENTIAL: Good  CLINICAL DECISION MAKING: Evolving/moderate complexity  EVALUATION COMPLEXITY: Moderate   GOALS: Goals reviewed with patient? Yes  SHORT TERM GOALS: Target date: 8/2  Able to demo stacked posture without shift Baseline: Goal status: INITIAL   LONG TERM GOALS: Target date: POC date  Ambulation with trunk rotation and resolution of trendelenburg Baseline:  Goal status: INITIAL  2.  Mod oswestry to improve by  MDC Baseline:  Goal status:  INITIAL  3.  Hip abd strength 85% right to left Baseline:  Goal status: INITIAL  4.  Pt will verbalize improvement in ability to get out of bed in the morning with reduced stiffness Baseline:  Goal status: INITIAL  5.  Return to walking program Baseline:  Goal status: INITIAL    PLAN:  PT FREQUENCY: 1-2x/week  PT DURATION: POC Date  PLANNED INTERVENTIONS: 97164- PT Re-evaluation, 97750- Physical Performance Testing, 97110-Therapeutic exercises, 97530- Therapeutic activity, [id number]- Neuromuscular re-education, 97535- Self Care, [id number]- Manual therapy, [id number]- Gait training, [phone]- Aquatic Therapy, [phone] (1-2 muscles), [id number] (3+ muscles)- Dry Needling, Patient/Family education, Balance training, Stair training, Taping, Joint mobilization, Spinal mobilization, and Cryotherapy.  PLAN FOR NEXT SESSION: hip MMT with hand dyno, check for need for shift correction, manual in bilateral periscapular regions- will DN when with a PT   Harlene Cordon, PT, DPT 01/19/2024, 11:54 AM  [employer] Medicare Authorization form:  Date 7/12  What was this (referring dx) caused by? Unspecified  Nature of Condition: Chronic (continuous duration > 3 months)  Current Functional Measure Score: See objective section of note  Objective measurements identify impairments when they are compared to normal values, the uninvolved extremity, and prior level of function.  [x]  Yes  []  No  Objective assessment of functional ability: Moderate functional limitations   Briefly describe symptoms: see subjective  How did symptoms start: insidious  Average pain intensity:  Last 24 hours: 8/10  Past week: 8/10  How often does the pt experience symptoms? Frequently  How much have the symptoms interfered with usual daily activities? Quite a bit  How is the condition changing,since beginning care at this facility? 5 A little better (7/12 is re-eval dae)  In general, how is the patients overall health?  Good   BACK PAIN (STarT Back Screening Tool) Has pain spread down the leg(s) at some time in the last 2 weeks? no Has there been pain in the shoulder or neck at some time in the last 2 weeks? yes Has the pt only walked short distances because of back pain? yes Has patient dressed more slowly because of back pain in the past 2 weeks? yes Does patient think it's not safe for a person with this condition to be physically active? yes Does patient have worrying thoughts a lot of the time? no Does patient feel back pain is terrible and will never get any better? no Has patient stopped enjoying things they usually enjoy? yes Overall, how bothersome has back pain been in the last 2 weeks?                    Very Much

## 2024-01-19 ENCOUNTER — Encounter (HOSPITAL_BASED_OUTPATIENT_CLINIC_OR_DEPARTMENT_OTHER): Payer: Self-pay | Admitting: Physical Therapy

## 2024-01-19 ENCOUNTER — Ambulatory Visit (HOSPITAL_BASED_OUTPATIENT_CLINIC_OR_DEPARTMENT_OTHER): Attending: Orthopaedic Surgery | Admitting: Physical Therapy

## 2024-01-19 DIAGNOSIS — M5136 Other intervertebral disc degeneration, lumbar region with discogenic back pain only: Secondary | ICD-10-CM | POA: Insufficient documentation

## 2024-01-19 DIAGNOSIS — M5459 Other low back pain: Secondary | ICD-10-CM | POA: Insufficient documentation

## 2024-01-19 DIAGNOSIS — M6281 Muscle weakness (generalized): Secondary | ICD-10-CM | POA: Insufficient documentation

## 2024-01-19 DIAGNOSIS — G8929 Other chronic pain: Secondary | ICD-10-CM | POA: Diagnosis not present

## 2024-01-19 DIAGNOSIS — M25512 Pain in left shoulder: Secondary | ICD-10-CM | POA: Insufficient documentation

## 2024-01-21 ENCOUNTER — Encounter: Admitting: Obstetrics and Gynecology

## 2024-01-21 ENCOUNTER — Other Ambulatory Visit (HOSPITAL_BASED_OUTPATIENT_CLINIC_OR_DEPARTMENT_OTHER): Payer: Self-pay | Admitting: Orthopaedic Surgery

## 2024-01-21 DIAGNOSIS — M5136 Other intervertebral disc degeneration, lumbar region with discogenic back pain only: Secondary | ICD-10-CM

## 2024-01-22 ENCOUNTER — Encounter (HOSPITAL_BASED_OUTPATIENT_CLINIC_OR_DEPARTMENT_OTHER)

## 2024-01-22 ENCOUNTER — Encounter: Admitting: Obstetrics and Gynecology

## 2024-01-24 ENCOUNTER — Ambulatory Visit: Admitting: Orthopedic Surgery

## 2024-01-24 ENCOUNTER — Encounter (HOSPITAL_BASED_OUTPATIENT_CLINIC_OR_DEPARTMENT_OTHER): Payer: Self-pay | Admitting: Obstetrics & Gynecology

## 2024-01-24 ENCOUNTER — Encounter (HOSPITAL_BASED_OUTPATIENT_CLINIC_OR_DEPARTMENT_OTHER)

## 2024-01-24 ENCOUNTER — Other Ambulatory Visit (HOSPITAL_BASED_OUTPATIENT_CLINIC_OR_DEPARTMENT_OTHER): Payer: Self-pay

## 2024-01-24 VITALS — BP 123/70 | HR 90 | Ht 64.0 in | Wt 136.6 lb

## 2024-01-24 DIAGNOSIS — M5416 Radiculopathy, lumbar region: Secondary | ICD-10-CM | POA: Diagnosis not present

## 2024-01-24 MED ORDER — PREGABALIN 75 MG PO CAPS
75.0000 mg | ORAL_CAPSULE | Freq: Two times a day (BID) | ORAL | 0 refills | Status: DC
  Filled 2024-01-24: qty 60, 30d supply, fill #0

## 2024-01-24 NOTE — Progress Notes (Signed)
 Orthopedic Spine Surgery Office Note  Assessment: Patient is a 83 y.o. female with low back pain that radiates into the bilateral lateral thighs (R>L).  Has DDD with mild foraminal stenosis on the right at L3/4   Plan: -Patient has tried PT, tylenol , ibuprofen , lumbar steroid injection -Given her mild stenosis, did not recommend any kind of surgical intervention.  Talked about trial of Lyrica  and pain management as other treatment options.  We decided to try Lyrica  which was prescribed to her today.  Told her to give it at least a week before deciding if it is working or not. Continue with PT.  Patient also going to try an inversion table -She did not get lasting relief with a lumbar steroid injection so would not do this in the future -Patient should return to office in 3 months, x-rays at next visit: none   Patient expressed understanding of the plan and all questions were answered to the patient's satisfaction.   ___________________________________________________________________________   History:  Patient is a 83 y.o. female who presents today for lumbar spine.  Patient has had over 6 months of low back pain that radiates into her bilateral extremities.  She feels the going into the right more so than the left.  She feels along the lateral aspect of the leg.  She at times feels that her right leg cannot support her and feels like she is going to fall.  She has not actually fallen or needed any assistive devices.  She did get an injection at L3/4 which she found helpful but it only lasted for a few weeks time.  There is no trauma or injury that preceded the onset of the pain.   Weakness: Yes, at times her right leg feels weaker and it feels like it is going to give out.  No other weakness noted Symptoms of imbalance: Yes, when her leg is about to give out she does feel off balance.  Otherwise no consistent unsteadiness or Paresthesias and numbness: Denies Bowel or bladder incontinence:  Yes, has a long history of urge incontinence.  No recent changes in bowel or bladder habits Saddle anesthesia: Denies  Treatments tried: PT, tylenol , ibuprofen , lumbar steroid injection  Review of systems: Denies fevers and chills, night sweats, unexplained weight loss, history of cancer.  Has had pain that wakes her at night  Past medical history: Allergic rhinitis Vitamin d  deficiency Osteopenia GERD HLD Urge incontinence  Allergies: codeine, atorvastatin, pravastatin, rosuvastatin, zoster vaccine  Past surgical history:  Appendectomy Cataract surgery Cholecystectomy Tonsillectomy Tubal ligation  Social history: Denies use of nicotine product (smoking, vaping, patches, smokeless) Alcohol use: denies Denies recreational drug use   Physical Exam:  BMI of 23.5  General: no acute distress, appears stated age Neurologic: alert, answering questions appropriately, following commands Respiratory: unlabored breathing on room air, symmetric chest rise Psychiatric: appropriate affect, normal cadence to speech   MSK (spine):  -Strength exam      Left  Right EHL    5/5  5/5 TA    5/5  5/5 GSC    5/5  5/5 Knee extension  5/5  5/5 Hip flexion   5/5  5/5  -Sensory exam    Sensation intact to light touch in L3-S1 nerve distributions of bilateral lower extremities  -Achilles DTR: 2/4 on the left, 2/4 on the right -Patellar tendon DTR: 2/4 on the left, 2/4 on the right  -Straight leg raise: negative bilaterally -Clonus: no beats bilaterally  -Left hip exam: No pain  through range of motion, negative Stinchfield, negative FABER -Right hip exam: No pain through range of motion, negative Stinchfield, negative FABER  Imaging: XR of the lumbar spine from 01/24/2024 were independently reviewed and interpreted, showing disc height loss at L3/4 and L5/S1.  No other significant degenerative changes seen.  No evidence of instability on flexion/tension views.  No fracture or  dislocation seen.  MRI of the lumbar spine from 12/18/2023 was independently reviewed and interpreted, showing DDD at L3/4 and L5/S1.  No significant central, lateral recess, or foraminal stenosis seen.   Patient name: Vanessa Trujillo Patient MRN: [id number] Date of visit: 01/24/24

## 2024-01-24 NOTE — Progress Notes (Signed)
 Scanned document

## 2024-01-25 ENCOUNTER — Encounter (HOSPITAL_BASED_OUTPATIENT_CLINIC_OR_DEPARTMENT_OTHER): Payer: Self-pay

## 2024-01-25 ENCOUNTER — Other Ambulatory Visit (HOSPITAL_BASED_OUTPATIENT_CLINIC_OR_DEPARTMENT_OTHER): Payer: Self-pay

## 2024-01-25 ENCOUNTER — Other Ambulatory Visit: Payer: Self-pay

## 2024-01-25 ENCOUNTER — Ambulatory Visit (HOSPITAL_BASED_OUTPATIENT_CLINIC_OR_DEPARTMENT_OTHER)

## 2024-01-25 DIAGNOSIS — G8929 Other chronic pain: Secondary | ICD-10-CM | POA: Diagnosis not present

## 2024-01-25 DIAGNOSIS — M6281 Muscle weakness (generalized): Secondary | ICD-10-CM

## 2024-01-25 DIAGNOSIS — M5136 Other intervertebral disc degeneration, lumbar region with discogenic back pain only: Secondary | ICD-10-CM | POA: Diagnosis not present

## 2024-01-25 DIAGNOSIS — M5459 Other low back pain: Secondary | ICD-10-CM | POA: Diagnosis not present

## 2024-01-25 DIAGNOSIS — M25512 Pain in left shoulder: Secondary | ICD-10-CM | POA: Diagnosis not present

## 2024-01-25 NOTE — Therapy (Addendum)
 OUTPATIENT PHYSICAL THERAPY TREATMENT   Patient Name: Vanessa Trujillo MRN: [id number] DOB:Nov 23, 1940, 83 y.o., female Today's Date: 01/25/2024  END OF SESSION:  PT End of Session - 01/25/24 1027     Visit Number 3    Number of Visits 10    Date for PT Re-Evaluation 02/07/24    Authorization Type [employer]    Authorization Time Period --    Authorization - Number of Visits --    PT Start Time 1023    PT Stop Time 1101    PT Time Calculation (min) 38 min    Activity Tolerance Patient tolerated treatment well    Behavior During Therapy WFL for tasks assessed/performed           Past Medical History:  Diagnosis Date   Allergic rhinitis    Aortic atherosclerosis (HCC)    Chronic UTI    DDD (degenerative disc disease), lumbar    Diverticulosis of colon    GERD (gastroesophageal reflux disease)    Hematuria    Hiatal hernia    large per 06/05/23 CT A/P in Epic   History of adenomatous polyp of colon    History of hepatitis    age 35, hospitalized   History of kidney stones    Hyperlipidemia    results in epic;   NUC 06-18-2016  LR w/ nuclear ef 82%;  echo 06-18-2026 normal w/ ef 60-65%;  CTC 08-21-2018  calcium score= 0.9, LR   Macular degeneration of both eyes    OAB (overactive bladder)    Follows w/ [facility].   Osteopenia    PONV (postoperative nausea and vomiting)    TMJ (sprain of temporomandibular joint)    around 2018   Urge incontinence    Uterine fibroid    Vitamin B12 deficiency    Vitamin D  deficiency    Past Surgical History:  Procedure Laterality Date   APPENDECTOMY  1959   CATARACT EXTRACTION W/ INTRAOCULAR LENS IMPLANT Right 09/08/2015   Dr. Camillo   CHOLECYSTECTOMY  1989   COLONOSCOPY WITH PROPOFOL   04/08/2012   dr donnald   CYSTOSCOPY N/A 07/16/2023   Procedure: CYSTOSCOPY with hydrodistention and intravesical botox  injection (100 units);  Surgeon: Marilynne Rosaline SAILOR, MD;  Location: [facility];   Service: Gynecology;  Laterality: N/A;   DILATATION & CURRETTAGE/HYSTEROSCOPY WITH RESECTOCOPE  04/12/1999   @ [facility] by dr fredrik sharps;    polypectomy   LITHOTRIPSY     TONSILLECTOMY AND ADENOIDECTOMY  1946   TUBAL LIGATION Bilateral 1979   Patient Active Problem List   Diagnosis Date Noted   Medication monitoring encounter 08/09/2022   Overactive bladder 10/18/2021   Recurrent UTI 10/18/2021   Depression 06/17/2016   Gastroesophageal reflux disease without esophagitis    Chest pain at rest 06/16/2016   Cough 06/16/2016   Hyperglycemia 06/16/2016   Calculus of kidney 04/16/2013   Urge incontinence 04/16/2013   Urinary urgency 04/16/2013   Infection of urinary tract 04/16/2013    REFERRING PROVIDER: Genelle Standing, MD   REFERRING DIAG:  M53.3,G89.29 (ICD-10-CM) - Chronic left SI joint pain   Rationale for Evaluation and Treatment: Rehabilitation  THERAPY DIAG:  Muscle weakness (generalized)  Other low back pain  Chronic left shoulder pain  ONSET DATE: Dec 2024/Jan 2025   SUBJECTIVE:  SUBJECTIVE STATEMENT:  Pt reports she saw the MD yesterday who told her she won't have to have surgery. He feels therapy will be helpful and prescribed Lyrica  which she will pick up today. Back continues to be stiff and R leg feels weak. L shoulder blade area has been hurting, no improvement after MT last session. Using heel lift without issue.    Re-evaluation performed on 01/19/2024 for dx of SIJD.  Very stiff across the back. AM stiffness- not as bad as it was before the injection but still bad. What worries me the most is the right leg- like it won't support me. I can tell I have lost a lot of strength. I do my exercises every morning to decrease stiffness.  The pain in my left shoulder region is really back.  Upper back tends to cramp as I am doing things.   PERTINENT HISTORY:  Chronic back pain  PAIN:  Are you having pain? Yes: NPRS scale: severe Pain location: across low back Pain description: tight Aggravating factors: AM Relieving factors: movement  PRECAUTIONS:  None  RED FLAGS: None   WEIGHT BEARING RESTRICTIONS:  No  FALLS:  Has patient fallen in last 6 months? No   PLOF:  Independent  PATIENT GOALS:  Decrease pain   OBJECTIVE:  Note: Objective measures were completed at Evaluation unless otherwise noted.  DIAGNOSTIC FINDINGS:  MRI 6/10 IMPRESSION: Mild retrolisthesis of L3 on L4 and moderate degenerative disc disease with mild endplate marrow edema.   Multilevel degenerative spondylosis with levels described in detail above.   No disc protrusion or evidence of impingement   PATIENT SURVEYS:  Modified Oswestry:  MODIFIED OSWESTRY DISABILITY SCALE  Date: 7/12 Score  Pain intensity 4 =  Pain medication provides me with little relief from pain.  2. Personal care (washing, dressing, etc.) 2 =  It is painful to take care of myself, and I am slow and careful.  3. Lifting 4 = I can lift only very light weights  4. Walking 3 =  Pain prevents me from walking more than  mile.  5. Sitting 3 =  Pain prevents me from sitting more than  hour.  6. Standing 4 =  Pain prevents me from standing more than 10 minutes.  7. Sleeping 2 =  Even when I take pain medication, I sleep less than 6 hours  8. Social Life 3 =  Pain prevents me from going out very often.  9. Traveling 4 = My pain restricts my travel to short necessary journeys under 1/2 hour.  10. Employment/ Homemaking 3 = Pain prevents me from doing anything but light duties.  Total 32/50   Interpretation of scores: Score Category Description  0-20% Minimal Disability The patient can cope with most living activities. Usually no treatment is indicated apart from advice on lifting, sitting and exercise  21-40%  Moderate Disability The patient experiences more pain and difficulty with sitting, lifting and standing. Travel and social life are more difficult and they may be disabled from work. Personal care, sexual activity and sleeping are not grossly affected, and the patient can usually be managed by conservative means  41-60% Severe Disability Pain remains the main problem in this group, but activities of daily living are affected. These patients require a detailed investigation  61-80% Crippled Back pain impinges on all aspects of the patient's life. Positive intervention is required  81-100% Bed-bound  These patients are either bed-bound or exaggerating their symptoms  Bluford BRAVO, Zoe DELENA Britain  A, et al. Surgery versus conservative management of stable thoracolumbar fracture: the PRESTO feasibility RCT. [place] ([place]): [employer]; 2021 Nov. [facility] Technology Assessment, No. 25.62.) Appendix 3, Oswestry Disability Index category descriptors. Available from: FindJewel[employer].cz  Minimally Clinically Important Difference (MCID) = 12.8%  COGNITIVE STATUS: Within functional limits for tasks assessed   SENSATION: WFL   POSTURE:  Eval- seated: shifted to the left with left shoulder elevation, bilateral rounded shoulders. Standing: Rt ASIS elevation, left scapular elevation, incr thoracic kyphosis, Rt scapular winging  GAIT: 01/19/24 Comments: Rt trendelenburg, slow cadence, lacking trunk rotation   Body Part #1 Hip    LOWER EXTREMITY MMT:     MMT (lb) Right eval Left eval Rt/Lt 4/9 Rt/Lt 5/5  Hip flexion          Hip extension          Hip abduction (SL at knee) 15.9 6.4 12.5/11.1 14.9/21.2  Hip adduction          Hip internal rotation          Hip external rotation          Knee flexion 12.0 10.2 16.4/12.2 23.9/20.5  Knee extension 26.3 11.0 32.1/12.7 29.2/21.4   (Blank rows = not tested)                                                                                                                               TREATMENT DATE:   01/25/24 STM and TPR to L scapular region in s/l with pillow b/w knees -S/l clams x20ea -UT stretching 2x30seconds L -seated rhomboid stretching 2x4breaths -sit to stands from elevated plinth 2x5 (cues for breath) -Lumbar shift- left shoulder on wall hips move right to left    01/19/24 Lumbar shift- left shoulder on wall hips move right to left Gait- lacking progression of left shoulder in trunk rotation Breathing with Lt lower rib cage depression- expansion of right lateral, inf ribs Lift in left shoe, 3 layer   PATIENT EDUCATION:  Education details: Anatomy of condition, POC, HEP, exercise form/rationale Person educated: Patient Education method: Explanation, Demonstration, Tactile cues, Verbal cues, and Handouts Education comprehension: verbalized understanding, returned demonstration, verbal cues required, tactile cues required, and needs further education  HOME EXERCISE PROGRAM: Wear lift, Rt rib cage expansion/Lt anchor   ASSESSMENT:  CLINICAL IMPRESSION:  Worked on TPR and STM to L periscapular mm. And thoracic ps to address significant tightness and triggerpoints present in this area. She reported relief following MT. Did report UT tightness/discomfort so worked on UT stretching with good performance. Verbally instructed in tennis ball use for self IASTM. Mild difficulty with STS and required cues to avoid Valsalva. Will continue to progress as tolerated.   Re-eval: Patient is a 83 y.o. F who was seen today for physical therapy re-evaluation and treatment for chronic LBP and Lt shoulder pain. Lt shoudler pain is consistent with spasm around scapula from elevated posture. Added heel lift to left  shoe and began correcting lumbar shift. Pt was able to improve trunk rotation in gait today with tactile cues and will continue to work on this.     OBJECTIVE IMPAIRMENTS: Abnormal gait,  decreased activity tolerance, decreased balance, decreased endurance, difficulty walking, decreased ROM, decreased strength, increased muscle spasms, improper body mechanics, and pain.   ACTIVITY LIMITATIONS: carrying, lifting, bending, sitting, standing, squatting, sleeping, stairs, transfers, bed mobility, dressing, reach over head, and locomotion level  PARTICIPATION LIMITATIONS: meal prep, cleaning, laundry, driving, shopping, community activity, and yard work  PERSONAL FACTORS: Fitness and Time since onset of injury/illness/exacerbation are also affecting patient's functional outcome.   REHAB POTENTIAL: Good  CLINICAL DECISION MAKING: Evolving/moderate complexity  EVALUATION COMPLEXITY: Moderate   GOALS: Goals reviewed with patient? Yes  SHORT TERM GOALS: Target date: 8/2  Able to demo stacked posture without shift Baseline: Goal status: INITIAL   LONG TERM GOALS: Target date: POC date  Ambulation with trunk rotation and resolution of trendelenburg Baseline:  Goal status: INITIAL  2.  Mod oswestry to improve by MDC Baseline:  Goal status: INITIAL  3.  Hip abd strength 85% right to left Baseline:  Goal status: INITIAL  4.  Pt will verbalize improvement in ability to get out of bed in the morning with reduced stiffness Baseline:  Goal status: INITIAL  5.  Return to walking program Baseline:  Goal status: INITIAL    PLAN:  PT FREQUENCY: 1-2x/week  PT DURATION: POC Date  PLANNED INTERVENTIONS: 97164- PT Re-evaluation, 97750- Physical Performance Testing, 97110-Therapeutic exercises, 97530- Therapeutic activity, [id number]- Neuromuscular re-education, 97535- Self Care, [id number]- Manual therapy, [id number]- Gait training, [id number]- Aquatic Therapy, [phone] (1-2 muscles), 20561 (3+ muscles)- Dry Needling, Patient/Family education, Balance training, Stair training, Taping, Joint mobilization, Spinal mobilization, and Cryotherapy.  PLAN FOR NEXT SESSION: hip MMT with hand dyno,  check for need for shift correction, manual in bilateral periscapular regions- will DN when with a PT   Asberry FORBES Rodes, PTA 01/25/2024, 1:39 PM  [employer] Medicare Authorization form:  Date 7/12  What was this (referring dx) caused by? Unspecified  Nature of Condition: Chronic (continuous duration > 3 months)  Current Functional Measure Score: See objective section of note  Objective measurements identify impairments when they are compared to normal values, the uninvolved extremity, and prior level of function.  [x]  Yes  []  No  Objective assessment of functional ability: Moderate functional limitations   Briefly describe symptoms: see subjective  How did symptoms start: insidious  Average pain intensity:  Last 24 hours: 8/10  Past week: 8/10  How often does the pt experience symptoms? Frequently  How much have the symptoms interfered with usual daily activities? Quite a bit  How is the condition changing,since beginning care at this facility? 5 A little better (7/12 is re-eval dae)  In general, how is the patients overall health? Good   BACK PAIN (STarT Back Screening Tool) Has pain spread down the leg(s) at some time in the last 2 weeks? no Has there been pain in the shoulder or neck at some time in the last 2 weeks? yes Has the pt only walked short distances because of back pain? yes Has patient dressed more slowly because of back pain in the past 2 weeks? yes Does patient think it's not safe for a person with this condition to be physically active? yes Does patient have worrying thoughts a lot of the time? no Does patient feel back pain is terrible and will never get any better? no  Has patient stopped enjoying things they usually enjoy? yes Overall, how bothersome has back pain been in the last 2 weeks?                    Very Much

## 2024-01-28 ENCOUNTER — Encounter (HOSPITAL_BASED_OUTPATIENT_CLINIC_OR_DEPARTMENT_OTHER): Admitting: Physical Therapy

## 2024-01-28 ENCOUNTER — Ambulatory Visit (HOSPITAL_BASED_OUTPATIENT_CLINIC_OR_DEPARTMENT_OTHER): Admitting: Physical Therapy

## 2024-01-28 ENCOUNTER — Encounter (HOSPITAL_BASED_OUTPATIENT_CLINIC_OR_DEPARTMENT_OTHER): Payer: Self-pay | Admitting: Physical Therapy

## 2024-01-28 ENCOUNTER — Encounter: Payer: Self-pay | Admitting: Orthopedic Surgery

## 2024-01-28 ENCOUNTER — Other Ambulatory Visit (HOSPITAL_BASED_OUTPATIENT_CLINIC_OR_DEPARTMENT_OTHER): Payer: Self-pay

## 2024-01-28 DIAGNOSIS — M5459 Other low back pain: Secondary | ICD-10-CM

## 2024-01-28 DIAGNOSIS — M6281 Muscle weakness (generalized): Secondary | ICD-10-CM

## 2024-01-28 DIAGNOSIS — G8929 Other chronic pain: Secondary | ICD-10-CM

## 2024-01-28 DIAGNOSIS — M25512 Pain in left shoulder: Secondary | ICD-10-CM | POA: Diagnosis not present

## 2024-01-28 DIAGNOSIS — M5136 Other intervertebral disc degeneration, lumbar region with discogenic back pain only: Secondary | ICD-10-CM | POA: Diagnosis not present

## 2024-01-28 MED ORDER — PREGABALIN 25 MG PO CAPS
25.0000 mg | ORAL_CAPSULE | Freq: Two times a day (BID) | ORAL | 0 refills | Status: DC
  Filled 2024-01-28: qty 60, 30d supply, fill #0

## 2024-01-28 NOTE — Therapy (Signed)
 OUTPATIENT PHYSICAL THERAPY TREATMENT   Patient Name: Vanessa Trujillo MRN: [id number] DOB:[birth year], 83 y.o., female Today's Date: 01/28/2024  END OF SESSION:  PT End of Session - 01/28/24 1320     Visit Number 4   #3 for ortho   Number of Visits 10    Date for PT Re-Evaluation 02/07/24    Authorization Type [employer]    PT Start Time 1320    PT Stop Time 1358    PT Time Calculation (min) 38 min    Activity Tolerance Patient tolerated treatment well    Behavior During Therapy WFL for tasks assessed/performed            Past Medical History:  Diagnosis Date   Allergic rhinitis    Aortic atherosclerosis (HCC)    Chronic UTI    DDD (degenerative disc disease), lumbar    Diverticulosis of colon    GERD (gastroesophageal reflux disease)    Hematuria    Hiatal hernia    large per 06/05/23 CT A/P in Epic   History of adenomatous polyp of colon    History of hepatitis    age 22, hospitalized   History of kidney stones    Hyperlipidemia    results in epic;   NUC 06-18-2016  LR w/ nuclear ef 82%;  echo 06-18-2026 normal w/ ef 60-65%;  CTC 08-21-2018  calcium score= 0.9, LR   Macular degeneration of both eyes    OAB (overactive bladder)    Follows w/ [facility].   Osteopenia    PONV (postoperative nausea and vomiting)    TMJ (sprain of temporomandibular joint)    around 2018   Urge incontinence    Uterine fibroid    Vitamin B12 deficiency    Vitamin D  deficiency    Past Surgical History:  Procedure Laterality Date   APPENDECTOMY  1959   CATARACT EXTRACTION W/ INTRAOCULAR LENS IMPLANT Right 09/08/2015   Dr. Camillo   CHOLECYSTECTOMY  1989   COLONOSCOPY WITH PROPOFOL   04/08/2012   dr donnald   CYSTOSCOPY N/A 07/16/2023   Procedure: CYSTOSCOPY with hydrodistention and intravesical botox  injection (100 units);  Surgeon: Marilynne Rosaline SAILOR, MD;  Location: [facility];  Service: Gynecology;  Laterality: N/A;   DILATATION &  CURRETTAGE/HYSTEROSCOPY WITH RESECTOCOPE  04/12/1999   @ [facility] by dr fredrik sharps;    polypectomy   LITHOTRIPSY     TONSILLECTOMY AND ADENOIDECTOMY  1946   TUBAL LIGATION Bilateral 1979   Patient Active Problem List   Diagnosis Date Noted   Medication monitoring encounter 08/09/2022   Overactive bladder 10/18/2021   Recurrent UTI 10/18/2021   Depression 06/17/2016   Gastroesophageal reflux disease without esophagitis    Chest pain at rest 06/16/2016   Cough 06/16/2016   Hyperglycemia 06/16/2016   Calculus of kidney 04/16/2013   Urge incontinence 04/16/2013   Urinary urgency 04/16/2013   Infection of urinary tract 04/16/2013    REFERRING PROVIDER: Genelle Standing, MD   REFERRING DIAG:  M53.3,G89.29 (ICD-10-CM) - Chronic left SI joint pain   Rationale for Evaluation and Treatment: Rehabilitation  THERAPY DIAG:  Muscle weakness (generalized)  Other low back pain  Chronic left shoulder pain  ONSET DATE: Dec 2024/Jan 2025   SUBJECTIVE:  SUBJECTIVE STATEMENT:  I am having a hard time with the lyrica  and plan to go talk to pharmacist again who suggested 1 pill dose to start. I have a HA from the lyrica . Legs are weak and still do not feel confident stepping up on left foot. What rebecca did to the shoulder/blade area was helpful.    Re-evaluation performed on 01/19/2024 for dx of SIJD.  Very stiff across the back. AM stiffness- not as bad as it was before the injection but still bad. What worries me the most is the right leg- like it won't support me. I can tell I have lost a lot of strength. I do my exercises every morning to decrease stiffness.  The pain in my left shoulder region is really back. Upper back tends to cramp as I am doing things.   PERTINENT HISTORY:  Chronic back pain  PAIN:  Are  you having pain? Yes: NPRS scale: severe Pain location: across low back Pain description: tight Aggravating factors: AM Relieving factors: movement  PRECAUTIONS:  None  RED FLAGS: None   WEIGHT BEARING RESTRICTIONS:  No  FALLS:  Has patient fallen in last 6 months? No   PLOF:  Independent  PATIENT GOALS:  Decrease pain   OBJECTIVE:  Note: Objective measures were completed at Evaluation unless otherwise noted.  DIAGNOSTIC FINDINGS:  MRI 6/10 IMPRESSION: Mild retrolisthesis of L3 on L4 and moderate degenerative disc disease with mild endplate marrow edema.   Multilevel degenerative spondylosis with levels described in detail above.   No disc protrusion or evidence of impingement   PATIENT SURVEYS:  Modified Oswestry:  MODIFIED OSWESTRY DISABILITY SCALE  Date: 7/12 Score  Pain intensity 4 =  Pain medication provides me with little relief from pain.  2. Personal care (washing, dressing, etc.) 2 =  It is painful to take care of myself, and I am slow and careful.  3. Lifting 4 = I can lift only very light weights  4. Walking 3 =  Pain prevents me from walking more than  mile.  5. Sitting 3 =  Pain prevents me from sitting more than  hour.  6. Standing 4 =  Pain prevents me from standing more than 10 minutes.  7. Sleeping 2 =  Even when I take pain medication, I sleep less than 6 hours  8. Social Life 3 =  Pain prevents me from going out very often.  9. Traveling 4 = My pain restricts my travel to short necessary journeys under 1/2 hour.  10. Employment/ Homemaking 3 = Pain prevents me from doing anything but light duties.  Total 32/50   Interpretation of scores: Score Category Description  0-20% Minimal Disability The patient can cope with most living activities. Usually no treatment is indicated apart from advice on lifting, sitting and exercise  21-40% Moderate Disability The patient experiences more pain and difficulty with sitting, lifting and standing.  Travel and social life are more difficult and they may be disabled from work. Personal care, sexual activity and sleeping are not grossly affected, and the patient can usually be managed by conservative means  41-60% Severe Disability Pain remains the main problem in this group, but activities of daily living are affected. These patients require a detailed investigation  61-80% Crippled Back pain impinges on all aspects of the patient's life. Positive intervention is required  81-100% Bed-bound  These patients are either bed-bound or exaggerating their symptoms  Bluford BRAVO, Zoe DELENA Karon DELENA, et al. Surgery versus  conservative management of stable thoracolumbar fracture: the PRESTO feasibility RCT. [place] ([place]): [employer]; 2021 Nov. [facility] Technology Assessment, No. 25.62.) Appendix 3, Oswestry Disability Index category descriptors. Available from: Find[employer]el[employer]cz  Minimally Clinically Important Difference (MCID) = 12.8%  COGNITIVE STATUS: Within functional limits for tasks assessed   SENSATION: WFL   POSTURE:  Eval- seated: shifted to the left with left shoulder elevation, bilateral rounded shoulders. Standing: Rt ASIS elevation, left scapular elevation, incr thoracic kyphosis, Rt scapular winging  GAIT: 01/19/24 Comments: Rt trendelenburg, slow cadence, lacking trunk rotation   Body Part #1 Hip    LOWER EXTREMITY MMT:     MMT (lb) Right eval Left eval Rt/Lt 4/9 Rt/Lt 5/5  Hip flexion          Hip extension          Hip abduction (SL at knee) 15.9 6.4 12.5/11.1 14.9/21.2  Hip adduction          Hip internal rotation          Hip external rotation          Knee flexion 12.0 10.2 16.4/12.2 23.9/20.5  Knee extension 26.3 11.0 32.1/12.7 29.2/21.4   (Blank rows = not tested)                                                                                                                              TREATMENT DATE:    01/28/24:  Manual:  TPDN YES Trigger Point Dry Needling  Subsequent Treatment: Instructions provided previously at initial dry needling treatment.   Patient Verbal Consent Given: Yes Education Handout Provided: Previously Provided Muscles Treated: Lt upper trap & levator Electrical Stimulation Performed: No Treatment Response/Outcome: twitch with depression of scapula and decreased concordant pain  Supine rib cage expansion breathing- circumferential - added yellow tband around ribs for tactile cues  Added iso add with exhale  Added mini bridge on heels with exhale   01/25/24 STM and TPR to L scapular region in s/l with pillow b/w knees -S/l clams x20ea -UT stretching 2x30seconds L -seated rhomboid stretching 2x4breaths -sit to stands from elevated plinth 2x5 (cues for breath) -Lumbar shift- left shoulder on wall hips move right to left    01/19/24 Lumbar shift- left shoulder on wall hips move right to left Gait- lacking progression of left shoulder in trunk rotation Breathing with Lt lower rib cage depression- expansion of right lateral, inf ribs Lift in left shoe, 3 layer   PATIENT EDUCATION:  Education details: Anatomy of condition, POC, HEP, exercise form/rationale Person educated: Patient Education method: Programmer, multimedia, Demonstration, Tactile cues, Verbal cues, and Handouts Education comprehension: verbalized understanding, returned demonstration, verbal cues required, tactile cues required, and needs further education  HOME EXERCISE PROGRAM: Wear lift, Rt rib cage expansion/Lt anchor   ASSESSMENT:  CLINICAL IMPRESSION:  Lt scapular elevation due to increased thoracic kyphosis and lack of lower rib cage mobility- able to engage adductors, gluts, obliques for trunk mobility and  allow for more upright posture and scapular depression. Excellent twitch response with TPDN.   Re-eval: Patient is a 83 y.o. F who was seen today for physical therapy re-evaluation and  treatment for chronic LBP and Lt shoulder pain. Lt shoudler pain is consistent with spasm around scapula from elevated posture. Added heel lift to left shoe and began correcting lumbar shift. Pt was able to improve trunk rotation in gait today with tactile cues and will continue to work on this.     OBJECTIVE IMPAIRMENTS: Abnormal gait, decreased activity tolerance, decreased balance, decreased endurance, difficulty walking, decreased ROM, decreased strength, increased muscle spasms, improper body mechanics, and pain.   ACTIVITY LIMITATIONS: carrying, lifting, bending, sitting, standing, squatting, sleeping, stairs, transfers, bed mobility, dressing, reach over head, and locomotion level  PARTICIPATION LIMITATIONS: meal prep, cleaning, laundry, driving, shopping, community activity, and yard work  PERSONAL FACTORS: Fitness and Time since onset of injury/illness/exacerbation are also affecting patient's functional outcome.   REHAB POTENTIAL: Good  CLINICAL DECISION MAKING: Evolving/moderate complexity  EVALUATION COMPLEXITY: Moderate   GOALS: Goals reviewed with patient? Yes  SHORT TERM GOALS: Target date: 8/2  Able to demo stacked posture without shift Baseline: Goal status: INITIAL   LONG TERM GOALS: Target date: POC date  Ambulation with trunk rotation and resolution of trendelenburg Baseline:  Goal status: INITIAL  2.  Mod oswestry to improve by MDC Baseline:  Goal status: INITIAL  3.  Hip abd strength 85% right to left Baseline:  Goal status: INITIAL  4.  Pt will verbalize improvement in ability to get out of bed in the morning with reduced stiffness Baseline:  Goal status: INITIAL  5.  Return to walking program Baseline:  Goal status: INITIAL    PLAN:  PT FREQUENCY: 1-2x/week  PT DURATION: POC Date  PLANNED INTERVENTIONS: 97164- PT Re-evaluation, 97750- Physical Performance Testing, 97110-Therapeutic exercises, 97530- Therapeutic activity, [id number]-  Neuromuscular re-education, 97535- Self Care, [id number]- Manual therapy, [id number]- Gait training, [id number]- Aquatic Therapy, [id number] (1-2 muscles), 20561 (3+ muscles)- Dry Needling, Patient/Family education, Balance training, Stair training, Taping, Joint mobilization, Spinal mobilization, and Cryotherapy.  PLAN FOR NEXT SESSION: hip MMT with hand dyno, check for need for shift correction, manual in bilateral periscapular regions- will DN when with a PT   Harlene Cordon, PT, DPT 01/28/2024, 4:17 PM  [employer] Medicare Authorization form:  Date 7/12  What was this (referring dx) caused by? Unspecified  Nature of Condition: Chronic (continuous duration > 3 months)  Current Functional Measure Score: See objective section of note  Objective measurements identify impairments when they are compared to normal values, the uninvolved extremity, and prior level of function.  [x]  Yes  []  No  Objective assessment of functional ability: Moderate functional limitations   Briefly describe symptoms: see subjective  How did symptoms start: insidious  Average pain intensity:  Last 24 hours: 8/10  Past week: 8/10  How often does the pt experience symptoms? Frequently  How much have the symptoms interfered with usual daily activities? Quite a bit  How is the condition changing,since beginning care at this facility? 5 A little better (7/12 is re-eval dae)  In general, how is the patients overall health? Good   BACK PAIN (STarT Back Screening Tool) Has pain spread down the leg(s) at some time in the last 2 weeks? no Has there been pain in the shoulder or neck at some time in the last 2 weeks? yes Has the pt only walked short distances because of back pain? yes Has  patient dressed more slowly because of back pain in the past 2 weeks? yes Does patient think it's not safe for a person with this condition to be physically active? yes Does patient have worrying thoughts a lot of the time? no Does patient feel  back pain is terrible and will never get any better? no Has patient stopped enjoying things they usually enjoy? yes Overall, how bothersome has back pain been in the last 2 weeks?                    Very Much

## 2024-02-04 ENCOUNTER — Encounter (HOSPITAL_BASED_OUTPATIENT_CLINIC_OR_DEPARTMENT_OTHER): Admitting: Physical Therapy

## 2024-02-05 ENCOUNTER — Encounter: Payer: Self-pay | Admitting: Obstetrics and Gynecology

## 2024-02-05 ENCOUNTER — Ambulatory Visit (INDEPENDENT_AMBULATORY_CARE_PROVIDER_SITE_OTHER): Admitting: Obstetrics and Gynecology

## 2024-02-05 ENCOUNTER — Ambulatory Visit (HOSPITAL_BASED_OUTPATIENT_CLINIC_OR_DEPARTMENT_OTHER): Admitting: Physical Therapy

## 2024-02-05 ENCOUNTER — Other Ambulatory Visit (HOSPITAL_BASED_OUTPATIENT_CLINIC_OR_DEPARTMENT_OTHER): Payer: Self-pay

## 2024-02-05 ENCOUNTER — Encounter (HOSPITAL_BASED_OUTPATIENT_CLINIC_OR_DEPARTMENT_OTHER): Payer: Self-pay | Admitting: Physical Therapy

## 2024-02-05 VITALS — BP 111/66 | HR 106

## 2024-02-05 DIAGNOSIS — M5459 Other low back pain: Secondary | ICD-10-CM

## 2024-02-05 DIAGNOSIS — M25512 Pain in left shoulder: Secondary | ICD-10-CM | POA: Diagnosis not present

## 2024-02-05 DIAGNOSIS — M5136 Other intervertebral disc degeneration, lumbar region with discogenic back pain only: Secondary | ICD-10-CM | POA: Diagnosis not present

## 2024-02-05 DIAGNOSIS — Z87440 Personal history of urinary (tract) infections: Secondary | ICD-10-CM

## 2024-02-05 DIAGNOSIS — N3281 Overactive bladder: Secondary | ICD-10-CM | POA: Diagnosis not present

## 2024-02-05 DIAGNOSIS — G8929 Other chronic pain: Secondary | ICD-10-CM

## 2024-02-05 DIAGNOSIS — M6281 Muscle weakness (generalized): Secondary | ICD-10-CM | POA: Diagnosis not present

## 2024-02-05 DIAGNOSIS — N390 Urinary tract infection, site not specified: Secondary | ICD-10-CM

## 2024-02-05 MED ORDER — SULFAMETHOXAZOLE-TRIMETHOPRIM 400-80 MG PO TABS
0.5000 | ORAL_TABLET | Freq: Every day | ORAL | 0 refills | Status: DC
  Filled 2024-02-05 – 2024-02-25 (×3): qty 50, 100d supply, fill #0

## 2024-02-05 NOTE — Progress Notes (Signed)
  Urogynecology Return Visit  SUBJECTIVE  History of Present Illness: Vanessa Trujillo is a 83 y.o. female seen in follow-up for OAB, prolapse and recurrent UTI.  Started low dose bactrim  prophylaxis daily in May due to rUTIs. Has also been using vaginal estrogen. Not having any UTI symptoms.   Currently on lyrica  and physical therapy for her back. Still in a lot of pain. Also had an assessment with pelvic PT that soon as well.    Continues to wear a lot of pads during the day and pullups at night due to incontinence. Not having any pressure/ prolapse symptoms.   Prior therapies: pelvic PT, myrbetriq , gemtesa , tolterodine, trospium , PTNS (at [facility]), PNE trial for SNM in Dec 2024, Cystoscopy with hydrodistention and intravesical botox  injection (100 units) in Jan 2025, botox  x3 at [facility].   Past Medical History: Patient  has a past medical history of Allergic rhinitis, Aortic atherosclerosis (HCC), Chronic UTI, DDD (degenerative disc disease), lumbar, Diverticulosis of colon, GERD (gastroesophageal reflux disease), Hematuria, Hiatal hernia, History of adenomatous polyp of colon, History of hepatitis, History of kidney stones, Hyperlipidemia, Macular degeneration of both eyes, OAB (overactive bladder), Osteopenia, PONV (postoperative nausea and vomiting), TMJ (sprain of temporomandibular joint), Urge incontinence, Uterine fibroid, Vitamin B12 deficiency, and Vitamin D  deficiency.   Past Surgical History: She  has a past surgical history that includes Tonsillectomy and adenoidectomy (1946); Appendectomy (1959); Tubal ligation (Bilateral, 1979); Cholecystectomy (1989); Lithotripsy; Cataract extraction w/ intraocular lens implant (Right, 09/08/2015); Cystoscopy (N/A, 07/16/2023); Dilatation & currettage/hysteroscopy with resectoscope (04/12/1999); and Colonoscopy with propofol  (04/08/2012).   Medications: She has a current medication list which includes the following  prescription(s): citalopram , estradiol , repatha  sureclick, fluorouracil , multiple vitamins-minerals, NONFORMULARY OR COMPOUNDED ITEM, omeprazole , pregabalin , align, sulfamethoxazole -trimethoprim , vitamin b-12, vitamin c , and polyethylene glycol powder.   Allergies: Patient is allergic to codeine, atorvastatin, pravastatin, rosuvastatin calcium, and zoster vac recomb adjuvanted.   Social History: Patient  reports that she has never smoked. She has never used smokeless tobacco. She reports that she does not drink alcohol and does not use drugs.     OBJECTIVE     Physical Exam: Vitals:   02/05/24 1404  BP: 111/66  Pulse: (!) 106    Gen: No apparent distress, A&O x 3.  Detailed Urogynecologic Evaluation:  Deferred. Prior exam showed:     ASSESSMENT AND PLAN    Vanessa Trujillo is a 82 y.o. with:  1. Recurrent UTI   2. Overactive bladder    - Continue with bactrim  prophylaxis for 3 more months, refill provided. Continue vaginal estrogen.  - She wants to reassess symptoms after PT is completed and hopefully feeling better with her back.  - We reviewed options again of SNM stage 1 procedure in OR for trial or botox  200 units. She is considering these.  Follow up 3 months or sooner if needed   Vanessa LOISE Caper, MD  Time spent: I spent 20 minutes dedicated to the care of this patient on the date of this encounter to include pre-visit review of records, face-to-face time with the patient and post visit documentation.

## 2024-02-05 NOTE — Therapy (Signed)
 OUTPATIENT PHYSICAL THERAPY TREATMENT   Patient Name: Vanessa Trujillo MRN: [id number] DOB:Feb 18, 1941, 84 y.o., female Today's Date: 02/05/2024  END OF SESSION:  PT End of Session - 02/05/24 1103     Visit Number 5   5 for ortho   Number of Visits 10    Date for PT Re-Evaluation 02/07/24    Authorization Type [employer]    PT Start Time 1102    PT Stop Time 1141    PT Time Calculation (min) 39 min    Activity Tolerance Patient tolerated treatment well    Behavior During Therapy WFL for tasks assessed/performed             Past Medical History:  Diagnosis Date   Allergic rhinitis    Aortic atherosclerosis (HCC)    Chronic UTI    DDD (degenerative disc disease), lumbar    Diverticulosis of colon    GERD (gastroesophageal reflux disease)    Hematuria    Hiatal hernia    large per 06/05/23 CT A/P in Epic   History of adenomatous polyp of colon    History of hepatitis    age 44, hospitalized   History of kidney stones    Hyperlipidemia    results in epic;   NUC 06-18-2016  LR w/ nuclear ef 82%;  echo 06-18-2026 normal w/ ef 60-65%;  CTC 08-21-2018  calcium score= 0.9, LR   Macular degeneration of both eyes    OAB (overactive bladder)    Follows w/ [facility].   Osteopenia    PONV (postoperative nausea and vomiting)    TMJ (sprain of temporomandibular joint)    around 2018   Urge incontinence    Uterine fibroid    Vitamin B12 deficiency    Vitamin D  deficiency    Past Surgical History:  Procedure Laterality Date   APPENDECTOMY  1959   CATARACT EXTRACTION W/ INTRAOCULAR LENS IMPLANT Right 09/08/2015   Dr. Camillo   CHOLECYSTECTOMY  1989   COLONOSCOPY WITH PROPOFOL   04/08/2012   dr donnald   CYSTOSCOPY N/A 07/16/2023   Procedure: CYSTOSCOPY with hydrodistention and intravesical botox  injection (100 units);  Surgeon: Marilynne Rosaline SAILOR, MD;  Location: [facility];  Service: Gynecology;  Laterality: N/A;   DILATATION &  CURRETTAGE/HYSTEROSCOPY WITH RESECTOCOPE  04/12/1999   @ [facility] by dr fredrik sharps;    polypectomy   LITHOTRIPSY     TONSILLECTOMY AND ADENOIDECTOMY  1946   TUBAL LIGATION Bilateral 1979   Patient Active Problem List   Diagnosis Date Noted   Medication monitoring encounter 08/09/2022   Overactive bladder 10/18/2021   Recurrent UTI 10/18/2021   Depression 06/17/2016   Gastroesophageal reflux disease without esophagitis    Chest pain at rest 06/16/2016   Cough 06/16/2016   Hyperglycemia 06/16/2016   Calculus of kidney 04/16/2013   Urge incontinence 04/16/2013   Urinary urgency 04/16/2013   Infection of urinary tract 04/16/2013    REFERRING PROVIDER: Genelle Standing, MD   REFERRING DIAG:  M53.3,G89.29 (ICD-10-CM) - Chronic left SI joint pain   Rationale for Evaluation and Treatment: Rehabilitation  THERAPY DIAG:  Muscle weakness (generalized)  Other low back pain  Chronic left shoulder pain  ONSET DATE: Dec 2024/Jan 2025   SUBJECTIVE:  SUBJECTIVE STATEMENT:  I think the lyrica  is finally starting to help. I am walking better, still lacking strength/confidence to step up with left leg. The left shoulder blade is still so bad and almost debilitating sometimes.    Re-evaluation performed on 01/19/2024 for dx of SIJD.  Very stiff across the back. AM stiffness- not as bad as it was before the injection but still bad. What worries me the most is the right leg- like it won't support me. I can tell I have lost a lot of strength. I do my exercises every morning to decrease stiffness.  The pain in my left shoulder region is really back. Upper back tends to cramp as I am doing things.   PERTINENT HISTORY:  Chronic back pain  PAIN:  Are you having pain? Yes: NPRS scale: severe Pain location: across low  back Pain description: tight Aggravating factors: AM Relieving factors: movement  PRECAUTIONS:  None  RED FLAGS: None   WEIGHT BEARING RESTRICTIONS:  No  FALLS:  Has patient fallen in last 6 months? No   PLOF:  Independent  PATIENT GOALS:  Decrease pain   OBJECTIVE:  Note: Objective measures were completed at Evaluation unless otherwise noted.  DIAGNOSTIC FINDINGS:  MRI 6/10 IMPRESSION: Mild retrolisthesis of L3 on L4 and moderate degenerative disc disease with mild endplate marrow edema.   Multilevel degenerative spondylosis with levels described in detail above.   No disc protrusion or evidence of impingement   PATIENT SURVEYS:  Modified Oswestry:  MODIFIED OSWESTRY DISABILITY SCALE  Date: 7/12 Score  Pain intensity 4 =  Pain medication provides me with little relief from pain.  2. Personal care (washing, dressing, etc.) 2 =  It is painful to take care of myself, and I am slow and careful.  3. Lifting 4 = I can lift only very light weights  4. Walking 3 =  Pain prevents me from walking more than  mile.  5. Sitting 3 =  Pain prevents me from sitting more than  hour.  6. Standing 4 =  Pain prevents me from standing more than 10 minutes.  7. Sleeping 2 =  Even when I take pain medication, I sleep less than 6 hours  8. Social Life 3 =  Pain prevents me from going out very often.  9. Traveling 4 = My pain restricts my travel to short necessary journeys under 1/2 hour.  10. Employment/ Homemaking 3 = Pain prevents me from doing anything but light duties.  Total 32/50   Interpretation of scores: Score Category Description  0-20% Minimal Disability The patient can cope with most living activities. Usually no treatment is indicated apart from advice on lifting, sitting and exercise  21-40% Moderate Disability The patient experiences more pain and difficulty with sitting, lifting and standing. Travel and social life are more difficult and they may be disabled from  work. Personal care, sexual activity and sleeping are not grossly affected, and the patient can usually be managed by conservative means  41-60% Severe Disability Pain remains the main problem in this group, but activities of daily living are affected. These patients require a detailed investigation  61-80% Crippled Back pain impinges on all aspects of the patient's life. Positive intervention is required  81-100% Bed-bound  These patients are either bed-bound or exaggerating their symptoms  Bluford FORBES Zoe DELENA Karon DELENA, et al. Surgery versus conservative management of stable thoracolumbar fracture: the PRESTO feasibility RCT. [place] ([place]): [employer]; 2021 Nov. [facility] Technology Assessment,  No. 25.62.) Appendix 3, Oswestry Disability Index category descriptors. Available from: [employer]el[employer]cz  Minimally Clinically Important Difference (MCID) = 12.8%  COGNITIVE STATUS: Within functional limits for tasks assessed   SENSATION: WFL   POSTURE:  Eval- seated: shifted to the left with left shoulder elevation, bilateral rounded shoulders. Standing: Rt ASIS elevation, left scapular elevation, incr thoracic kyphosis, Rt scapular winging  GAIT: 01/19/24 Comments: Rt trendelenburg, slow cadence, lacking trunk rotation   Body Part #1 Hip    LOWER EXTREMITY MMT:     MMT (lb) Right eval Left eval Rt/Lt 4/9 Rt/Lt 5/5  Hip flexion          Hip extension          Hip abduction (SL at knee) 15.9 6.4 12.5/11.1 14.9/21.2  Hip adduction          Hip internal rotation          Hip external rotation          Knee flexion 12.0 10.2 16.4/12.2 23.9/20.5  Knee extension 26.3 11.0 32.1/12.7 29.2/21.4   (Blank rows = not tested)                                                                                                                              TREATMENT DATE:   02/05/24: Discussed anatomy of condition & nervous pain vs MSK pain & input of  lyrica /other meds Sidelying STM along scapular medial border, deep breathing + scap distraciton, scap mobilizations Sidelying scap protraction/retraction Supine protraction reach at 90 deg flexion- required some manual work in this position as pt c/o referred pain into lower thoracic region Wall push ups +  01/28/24:  Manual:  TPDN YES Trigger Point Dry Needling  Subsequent Treatment: Instructions provided previously at initial dry needling treatment.   Patient Verbal Consent Given: Yes Education Handout Provided: Previously Provided Muscles Treated: Lt upper trap & levator Electrical Stimulation Performed: No Treatment Response/Outcome: twitch with depression of scapula and decreased concordant pain  Supine rib cage expansion breathing- circumferential - added yellow tband around ribs for tactile cues  Added iso add with exhale  Added mini bridge on heels with exhale   01/25/24 STM and TPR to L scapular region in s/l with pillow b/w knees -S/l clams x20ea -UT stretching 2x30seconds L -seated rhomboid stretching 2x4breaths -sit to stands from elevated plinth 2x5 (cues for breath) -Lumbar shift- left shoulder on wall hips move right to left    01/19/24 Lumbar shift- left shoulder on wall hips move right to left Gait- lacking progression of left shoulder in trunk rotation Breathing with Lt lower rib cage depression- expansion of right lateral, inf ribs Lift in left shoe, 3 layer   PATIENT EDUCATION:  Education details: Anatomy of condition, POC, HEP, exercise form/rationale Person educated: Patient Education method: Explanation, Demonstration, Tactile cues, Verbal cues, and Handouts Education comprehension: verbalized understanding, returned demonstration, verbal cues required, tactile cues required, and needs further education  HOME EXERCISE  PROGRAM: Wear lift, Rt rib cage expansion/Lt anchor  [id number]   ASSESSMENT:  CLINICAL IMPRESSION:  Tendency to move humerus  while holding scapula still, resulting in pain and trigger points in rhomboids and middle trap. Able to improve mobility and performance of retraction/protraction today- will practice with HEP. Will require further training/strengthening for long term gains. Discussed regular massage therapy to maintain mobility of fascial tissue.   Re-eval: Patient is a 83 y.o. F who was seen today for physical therapy re-evaluation and treatment for chronic LBP and Lt shoulder pain. Lt shoudler pain is consistent with spasm around scapula from elevated posture. Added heel lift to left shoe and began correcting lumbar shift. Pt was able to improve trunk rotation in gait today with tactile cues and will continue to work on this.     OBJECTIVE IMPAIRMENTS: Abnormal gait, decreased activity tolerance, decreased balance, decreased endurance, difficulty walking, decreased ROM, decreased strength, increased muscle spasms, improper body mechanics, and pain.   ACTIVITY LIMITATIONS: carrying, lifting, bending, sitting, standing, squatting, sleeping, stairs, transfers, bed mobility, dressing, reach over head, and locomotion level  PARTICIPATION LIMITATIONS: meal prep, cleaning, laundry, driving, shopping, community activity, and yard work  PERSONAL FACTORS: Fitness and Time since onset of injury/illness/exacerbation are also affecting patient's functional outcome.   REHAB POTENTIAL: Good  CLINICAL DECISION MAKING: Evolving/moderate complexity  EVALUATION COMPLEXITY: Moderate   GOALS: Goals reviewed with patient? Yes  SHORT TERM GOALS: Target date: 8/2  Able to demo stacked posture without shift Baseline: Goal status: MET 7/29   LONG TERM GOALS: Target date: POC date  Ambulation with trunk rotation and resolution of trendelenburg Baseline:  Goal status: INITIAL  2.  Mod oswestry to improve by MDC Baseline:  Goal status: INITIAL  3.  Hip abd strength 85% right to left Baseline:  Goal status:  INITIAL  4.  Pt will verbalize improvement in ability to get out of bed in the morning with reduced stiffness Baseline:  Goal status: INITIAL  5.  Return to walking program Baseline:  Goal status: INITIAL    PLAN:  PT FREQUENCY: 1-2x/week  PT DURATION: POC Date  PLANNED INTERVENTIONS: [id number]- PT Re-evaluation, 97750- Physical Performance Testing, 97110-Therapeutic exercises, 97530- Therapeutic activity, [id number]- Neuromuscular re-education, 97535- Self Care, [id number]- Manual therapy, [id number]- Gait training, [phone]- Aquatic Therapy, [phone] (1-2 muscles), [id number] (3+ muscles)- Dry Needling, Patient/Family education, Balance training, Stair training, Taping, Joint mobilization, Spinal mobilization, and Cryotherapy.  PLAN FOR NEXT SESSION: hip MMT with hand dyno, check for need for shift correction, manual in bilateral periscapular regions- will DN when with a PT   Harlene Cordon, PT, DPT 02/05/2024, 11:44 AM  [employer] Medicare Authorization form:  Date 7/12  What was this (referring dx) caused by? Unspecified  Nature of Condition: Chronic (continuous duration > 3 months)  Current Functional Measure Score: See objective section of note  Objective measurements identify impairments when they are compared to normal values, the uninvolved extremity, and prior level of function.  [x]  Yes  []  No  Objective assessment of functional ability: Moderate functional limitations   Briefly describe symptoms: see subjective  How did symptoms start: insidious  Average pain intensity:  Last 24 hours: 8/10  Past week: 8/10  How often does the pt experience symptoms? Frequently  How much have the symptoms interfered with usual daily activities? Quite a bit  How is the condition changing,since beginning care at this facility? 5 A little better (7/12 is re-eval dae)  In general, how is the patients overall health?  Good   BACK PAIN (STarT Back Screening Tool) Has pain spread down the leg(s) at some  time in the last 2 weeks? no Has there been pain in the shoulder or neck at some time in the last 2 weeks? yes Has the pt only walked short distances because of back pain? yes Has patient dressed more slowly because of back pain in the past 2 weeks? yes Does patient think it's not safe for a person with this condition to be physically active? yes Does patient have worrying thoughts a lot of the time? no Does patient feel back pain is terrible and will never get any better? no Has patient stopped enjoying things they usually enjoy? yes Overall, how bothersome has back pain been in the last 2 weeks?                    Very Much

## 2024-02-06 ENCOUNTER — Other Ambulatory Visit (HOSPITAL_BASED_OUTPATIENT_CLINIC_OR_DEPARTMENT_OTHER): Payer: Self-pay

## 2024-02-11 ENCOUNTER — Ambulatory Visit (HOSPITAL_BASED_OUTPATIENT_CLINIC_OR_DEPARTMENT_OTHER): Attending: Orthopaedic Surgery

## 2024-02-11 ENCOUNTER — Encounter (HOSPITAL_BASED_OUTPATIENT_CLINIC_OR_DEPARTMENT_OTHER): Payer: Self-pay

## 2024-02-11 DIAGNOSIS — M6281 Muscle weakness (generalized): Secondary | ICD-10-CM | POA: Insufficient documentation

## 2024-02-11 DIAGNOSIS — M5459 Other low back pain: Secondary | ICD-10-CM | POA: Insufficient documentation

## 2024-02-11 DIAGNOSIS — G8929 Other chronic pain: Secondary | ICD-10-CM | POA: Diagnosis not present

## 2024-02-11 DIAGNOSIS — M25512 Pain in left shoulder: Secondary | ICD-10-CM | POA: Diagnosis not present

## 2024-02-11 NOTE — Therapy (Addendum)
 OUTPATIENT PHYSICAL THERAPY TREATMENT Progress Note Reporting Period 01/19/2024 to 02/11/2024  See note below for Objective Data and Assessment of Progress/Goals.       Patient Name: Vanessa Trujillo MRN: [id number] DOB:[birth year], 83 y.o., female Today's Date: 02/11/2024  END OF SESSION:  PT End of Session - 02/11/24 1322     Visit Number 6    Number of Visits 10    Date for PT Re-Evaluation 02/07/24    Authorization Type [employer]    Authorization Time Period 12 visits approved  01/19/24-02/27/24    Authorization - Visit Number 5    Authorization - Number of Visits 12    Progress Note Due on Visit 16    PT Start Time 1303    PT Stop Time 1345    PT Time Calculation (min) 42 min    Activity Tolerance Patient tolerated treatment well    Behavior During Therapy WFL for tasks assessed/performed              Past Medical History:  Diagnosis Date   Allergic rhinitis    Aortic atherosclerosis (HCC)    Chronic UTI    DDD (degenerative disc disease), lumbar    Diverticulosis of colon    GERD (gastroesophageal reflux disease)    Hematuria    Hiatal hernia    large per 06/05/23 CT A/P in Epic   History of adenomatous polyp of colon    History of hepatitis    age 83, hospitalized   History of kidney stones    Hyperlipidemia    results in epic;   NUC 06-18-2016  LR w/ nuclear ef 82%;  echo 06-18-2026 normal w/ ef 60-65%;  CTC 08-21-2018  calcium score= 0.9, LR   Macular degeneration of both eyes    OAB (overactive bladder)    Follows w/ [facility].   Osteopenia    PONV (postoperative nausea and vomiting)    TMJ (sprain of temporomandibular joint)    around 2018   Urge incontinence    Uterine fibroid    Vitamin B12 deficiency    Vitamin D  deficiency    Past Surgical History:  Procedure Laterality Date   APPENDECTOMY  1959   CATARACT EXTRACTION W/ INTRAOCULAR LENS IMPLANT Right 09/08/2015   Dr. Camillo   CHOLECYSTECTOMY  1989    COLONOSCOPY WITH PROPOFOL   04/08/2012   dr donnald   CYSTOSCOPY N/A 07/16/2023   Procedure: CYSTOSCOPY with hydrodistention and intravesical botox  injection (100 units);  Surgeon: Vanessa Rosaline SAILOR, MD;  Location: [facility];  Service: Gynecology;  Laterality: N/A;   DILATATION & CURRETTAGE/HYSTEROSCOPY WITH RESECTOCOPE  04/12/1999   @ [facility] by dr fredrik sharps;    polypectomy   LITHOTRIPSY     TONSILLECTOMY AND ADENOIDECTOMY  1946   TUBAL LIGATION Bilateral 1979   Patient Active Problem List   Diagnosis Date Noted   Medication monitoring encounter 08/09/2022   Overactive bladder 10/18/2021   Recurrent UTI 10/18/2021   Depression 06/17/2016   Gastroesophageal reflux disease without esophagitis    Chest pain at rest 06/16/2016   Cough 06/16/2016   Hyperglycemia 06/16/2016   Calculus of kidney 04/16/2013   Urge incontinence 04/16/2013   Urinary urgency 04/16/2013   Infection of urinary tract 04/16/2013    REFERRING PROVIDER: Genelle Standing, MD   REFERRING DIAG:  M53.3,G89.29 (ICD-10-CM) - Chronic left SI joint pain   Rationale for Evaluation and Treatment: Rehabilitation  THERAPY DIAG:  Muscle weakness (generalized)  Other low back  pain  Chronic left shoulder pain  ONSET DATE: Dec 2024/Jan 2025   SUBJECTIVE:                                                                                                                                                                                           SUBJECTIVE STATEMENT:  Pt reports improvement in shoulder blade pain over the last few sessions. The exercises Vanessa Trujillo gave me really are helping.    Re-evaluation performed on 01/19/2024 for dx of SIJD.  Very stiff across the back. AM stiffness- not as bad as it was before the injection but still bad. What worries me the most is the right leg- like it won't support me. I can tell I have lost a lot of strength. I do my exercises every morning to decrease stiffness.   The pain in my left shoulder region is really back. Upper back tends to cramp as I am doing things.   PERTINENT HISTORY:  Chronic back pain  PAIN:  Are you having pain? Yes: NPRS scale: severe Pain location: across low back Pain description: tight Aggravating factors: AM Relieving factors: movement  PRECAUTIONS:  None  RED FLAGS: None   WEIGHT BEARING RESTRICTIONS:  No  FALLS:  Has patient fallen in last 6 months? No   PLOF:  Independent  PATIENT GOALS:  Decrease pain   OBJECTIVE:  Note: Objective measures were completed at Evaluation unless otherwise noted.  DIAGNOSTIC FINDINGS:  MRI 6/10 IMPRESSION: Mild retrolisthesis of L3 on L4 and moderate degenerative disc disease with mild endplate marrow edema.   Multilevel degenerative spondylosis with levels described in detail above.   No disc protrusion or evidence of impingement   PATIENT SURVEYS:  Modified Oswestry:  MODIFIED OSWESTRY DISABILITY SCALE   Date: 7/12 Score 02/11/24  Pain intensity 4 =  Pain medication provides me with little relief from pain. 3  2. Personal care (washing, dressing, etc.) 2 =  It is painful to take care of myself, and I am slow and careful. 1  3. Lifting 4 = I can lift only very light weights 2  4. Walking 3 =  Pain prevents me from walking more than  mile. 1  5. Sitting 3 =  Pain prevents me from sitting more than  hour. 0  6. Standing 4 =  Pain prevents me from standing more than 10 minutes. 4  7. Sleeping 2 =  Even when I take pain medication, I sleep less than 6 hours 1  8. Social Life 3 =  Pain prevents me from going out very often. 2  9. Traveling 4 = My pain restricts  my travel to short necessary journeys under 1/2 hour. 2  10. Employment/ Homemaking 3 = Pain prevents me from doing anything but light duties. 2  Total 32/50 34%   Interpretation of scores: Score Category Description  0-20% Minimal Disability The patient can cope with most living activities. Usually no  treatment is indicated apart from advice on lifting, sitting and exercise  21-40% Moderate Disability The patient experiences more pain and difficulty with sitting, lifting and standing. Travel and social life are more difficult and they may be disabled from work. Personal care, sexual activity and sleeping are not grossly affected, and the patient can usually be managed by conservative means  41-60% Severe Disability Pain remains the main problem in this group, but activities of daily living are affected. These patients require a detailed investigation  61-80% Crippled Back pain impinges on all aspects of the patient's life. Positive intervention is required  81-100% Bed-bound  These patients are either bed-bound or exaggerating their symptoms  Vanessa Trujillo, et al. Surgery versus conservative management of stable thoracolumbar fracture: the PRESTO feasibility RCT. [place] ([place]): [employer]; 2021 Nov. [facility] Technology Assessment, No. 25.62.) Appendix 3, Oswestry Disability Index category descriptors. Available from: [employer]cz  Minimally Clinically Important Difference (MCID) = 12.8%  COGNITIVE STATUS: Within functional limits for tasks assessed   SENSATION: WFL   POSTURE:  Eval- seated: shifted to the left with left shoulder elevation, bilateral rounded shoulders. Standing: Rt ASIS elevation, left scapular elevation, incr thoracic kyphosis, Rt scapular winging  GAIT: 01/19/24 Comments: Rt trendelenburg, slow cadence, lacking trunk rotation   Body Part #1 Hip    LOWER EXTREMITY MMT:     MMT (lb) Right eval Left eval Rt/Lt 4/9 Rt/Lt 5/5 Rt/lt 8/4  Hip flexion           Hip extension           Hip abduction (SL at knee) 15.9 6.4 12.5/11.1 14.9/21.2 25.4/27.0  Hip adduction           Hip internal rotation           Hip external rotation           Knee flexion 12.0 10.2 16.4/12.2 23.9/20.5 29.5/23.3  Knee  extension 26.3 11.0 32.1/12.7 29.2/21.4 31.1/28.2   (Blank rows = not tested)                                                                                                                              TREATMENT DATE:   02/11/24 STM to periscapular mm Sidelying scapulothoracic mobilizations Row RTB 2x15 (tactile cues) Protraction in s/l 2x10 with cuing Supine protraction 2x10 Updated MMT Updated goals MODI   02/05/24: Discussed anatomy of condition & nervous pain vs MSK pain & input of lyrica /other meds Sidelying STM along scapular medial border, deep breathing + scap distraciton, scap mobilizations Sidelying scap protraction/retraction Supine protraction reach at 90 deg flexion- required some manual work  in this position as pt c/o referred pain into lower thoracic region Wall push ups +  01/28/24:  Manual:  TPDN YES Trigger Point Dry Needling  Subsequent Treatment: Instructions provided previously at initial dry needling treatment.   Patient Verbal Consent Given: Yes Education Handout Provided: Previously Provided Muscles Treated: Lt upper trap & levator Electrical Stimulation Performed: No Treatment Response/Outcome: twitch with depression of scapula and decreased concordant pain  Supine rib cage expansion breathing- circumferential - added yellow tband around ribs for tactile cues  Added iso add with exhale  Added mini bridge on heels with exhale   01/25/24 STM and TPR to L scapular region in s/l with pillow b/w knees -S/l clams x20ea -UT stretching 2x30seconds L -seated rhomboid stretching 2x4breaths -sit to stands from elevated plinth 2x5 (cues for breath) -Lumbar shift- left shoulder on wall hips move right to left    01/19/24 Lumbar shift- left shoulder on wall hips move right to left Gait- lacking progression of left shoulder in trunk rotation Breathing with Lt lower rib cage depression- expansion of right lateral, inf ribs Lift in left shoe, 3  layer   PATIENT EDUCATION:  Education details: Anatomy of condition, POC, HEP, exercise form/rationale Person educated: Patient Education method: Explanation, Demonstration, Tactile cues, Verbal cues, and Handouts Education comprehension: verbalized understanding, returned demonstration, verbal cues required, tactile cues required, and needs further education  HOME EXERCISE PROGRAM: Wear lift, Rt rib cage expansion/Lt anchor  [id number]   ASSESSMENT:  CLINICAL IMPRESSION:  Pt has attended 6 visits of PT thus far and is making steady progress. Pt has met 1/1 STG and 1/5LTG. She continues to be limited by pain in periscapular area which affects ADLS. Subjective improvement in back and SIJ discomfort. Updated strength testing shows improvement in all areas of LE strength. She did have decreased score on MODI despite subjective improvements. She will benefit from continued PT to work on neuro re-ed and strengthening of periscapular strengthening in addition to hip strengthening/gait training to correct ongoing deviations. Will continue to progress as tolerated.    Re-eval: Patient is a 83 y.o. F who was seen today for physical therapy re-evaluation and treatment for chronic LBP and Lt shoulder pain. Lt shoudler pain is consistent with spasm around scapula from elevated posture. Added heel lift to left shoe and began correcting lumbar shift. Pt was able to improve trunk rotation in gait today with tactile cues and will continue to work on this.     OBJECTIVE IMPAIRMENTS: Abnormal gait, decreased activity tolerance, decreased balance, decreased endurance, difficulty walking, decreased ROM, decreased strength, increased muscle spasms, improper body mechanics, and pain.   ACTIVITY LIMITATIONS: carrying, lifting, bending, sitting, standing, squatting, sleeping, stairs, transfers, bed mobility, dressing, reach over head, and locomotion level  PARTICIPATION LIMITATIONS: meal prep, cleaning, laundry,  driving, shopping, community activity, and yard work  PERSONAL FACTORS: Fitness and Time since onset of injury/illness/exacerbation are also affecting patient's functional outcome.   REHAB POTENTIAL: Good  CLINICAL DECISION MAKING: Evolving/moderate complexity  EVALUATION COMPLEXITY: Moderate   GOALS: Goals reviewed with patient? Yes  SHORT TERM GOALS: Target date: 8/2  Able to demo stacked posture without shift Baseline: Goal status: MET 7/29   LONG TERM GOALS: Target date: POC date  Ambulation with trunk rotation and resolution of trendelenburg Baseline:  Goal status: IN PROGRESS 02/11/24  2.  Mod oswestry to improve by MDC Baseline:  Goal status: NOT MET 8/4  3.  Hip abd strength 85% right to left Baseline:  Goal  status: MET (94.1% on 8/4)  4.  Pt will verbalize improvement in ability to get out of bed in the morning with reduced stiffness Baseline:  Goal status: IN PROGRESS (Improving, not quite full improvement)8/4  5.  Return to walking program Baseline:  Goal status: IN PROGRESS (walked a mile with rest breaks 8/4)    PLAN:  PT FREQUENCY: 1-2x/week  PT DURATION: POC Date  PLANNED INTERVENTIONS: 97164- PT Re-evaluation, 97750- Physical Performance Testing, 97110-Therapeutic exercises, 97530- Therapeutic activity, [id number]- Neuromuscular re-education, 97535- Self Care, [id number]- Manual therapy, [id number]- Gait training, [phone]- Aquatic Therapy, [phone] (1-2 muscles), [id number] (3+ muscles)- Dry Needling, Patient/Family education, Balance training, Stair training, Taping, Joint mobilization, Spinal mobilization, and Cryotherapy.  PLAN FOR NEXT SESSION: hip MMT with hand dyno, check for need for shift correction, manual in bilateral periscapular regions- will DN when with a PT   Vanessa Trujillo, PTA 02/11/2024, 5:41 PM  [employer] Medicare Authorization form:  Date 7/12  What was this (referring dx) caused by? Unspecified  Nature of Condition: Chronic (continuous duration > 3  months)  Current Functional Measure Score: See objective section of note  Objective measurements identify impairments when they are compared to normal values, the uninvolved extremity, and prior level of function.  [x]  Yes  []  No  Objective assessment of functional ability: Moderate functional limitations   Briefly describe symptoms: see subjective  How did symptoms start: insidious  Average pain intensity:  Last 24 hours: 8/10  Past week: 8/10  How often does the pt experience symptoms? Frequently  How much have the symptoms interfered with usual daily activities? Quite a bit  How is the condition changing,since beginning care at this facility? 5 A little better (7/12 is re-eval dae)  In general, how is the patients overall health? Good   BACK PAIN (STarT Back Screening Tool) Has pain spread down the leg(s) at some time in the last 2 weeks? no Has there been pain in the shoulder or neck at some time in the last 2 weeks? yes Has the pt only walked short distances because of back pain? yes Has patient dressed more slowly because of back pain in the past 2 weeks? yes Does patient think it's not safe for a person with this condition to be physically active? yes Does patient have worrying thoughts a lot of the time? no Does patient feel back pain is terrible and will never get any better? no Has patient stopped enjoying things they usually enjoy? yes Overall, how bothersome has back pain been in the last 2 weeks?                    Very Much

## 2024-02-13 DIAGNOSIS — L720 Epidermal cyst: Secondary | ICD-10-CM | POA: Diagnosis not present

## 2024-02-13 DIAGNOSIS — L820 Inflamed seborrheic keratosis: Secondary | ICD-10-CM | POA: Diagnosis not present

## 2024-02-18 ENCOUNTER — Encounter (HOSPITAL_BASED_OUTPATIENT_CLINIC_OR_DEPARTMENT_OTHER): Payer: Self-pay

## 2024-02-18 ENCOUNTER — Ambulatory Visit (HOSPITAL_BASED_OUTPATIENT_CLINIC_OR_DEPARTMENT_OTHER)

## 2024-02-21 ENCOUNTER — Ambulatory Visit: Attending: Obstetrics and Gynecology | Admitting: Physical Therapy

## 2024-02-21 DIAGNOSIS — M6281 Muscle weakness (generalized): Secondary | ICD-10-CM | POA: Insufficient documentation

## 2024-02-21 DIAGNOSIS — M5459 Other low back pain: Secondary | ICD-10-CM | POA: Diagnosis not present

## 2024-02-21 NOTE — Therapy (Signed)
 OUTPATIENT PHYSICAL THERAPY FEMALE PELVIC TREATMENT   Patient Name: Vanessa Trujillo MRN: [id number] DOB:30-Apr-1941, 83 y.o., female Today's Date: 02/21/2024  END OF SESSION:  PT End of Session - 02/21/24 1530     Visit Number 7   PF 1   Number of Visits 10    Date for PT Re-Evaluation 02/07/24    Authorization Type [employer]    Authorization Time Period 12 visits approved  01/19/24-02/27/24    Authorization - Visit Number 7    Authorization - Number of Visits 12    Progress Note Due on Visit 16    PT Start Time 0245    PT Stop Time 0330    PT Time Calculation (min) 45 min    Activity Tolerance Patient tolerated treatment well    Behavior During Therapy WFL for tasks assessed/performed           Past Medical History:  Diagnosis Date   Allergic rhinitis    Aortic atherosclerosis (HCC)    Chronic UTI    DDD (degenerative disc disease), lumbar    Diverticulosis of colon    GERD (gastroesophageal reflux disease)    Hematuria    Hiatal hernia    large per 06/05/23 CT A/P in Epic   History of adenomatous polyp of colon    History of hepatitis    age 78, hospitalized   History of kidney stones    Hyperlipidemia    results in epic;   NUC 06-18-2016  LR w/ nuclear ef 82%;  echo 06-18-2026 normal w/ ef 60-65%;  CTC 08-21-2018  calcium score= 0.9, LR   Macular degeneration of both eyes    OAB (overactive bladder)    Follows w/ [facility].   Osteopenia    PONV (postoperative nausea and vomiting)    TMJ (sprain of temporomandibular joint)    around 2018   Urge incontinence    Uterine fibroid    Vitamin B12 deficiency    Vitamin D  deficiency    Past Surgical History:  Procedure Laterality Date   APPENDECTOMY  1959   CATARACT EXTRACTION W/ INTRAOCULAR LENS IMPLANT Right 09/08/2015   Dr. Camillo   CHOLECYSTECTOMY  1989   COLONOSCOPY WITH PROPOFOL   04/08/2012   dr donnald   CYSTOSCOPY N/A 07/16/2023   Procedure: CYSTOSCOPY with hydrodistention  and intravesical botox  injection (100 units);  Surgeon: Marilynne Rosaline SAILOR, MD;  Location: [facility];  Service: Gynecology;  Laterality: N/A;   DILATATION & CURRETTAGE/HYSTEROSCOPY WITH RESECTOCOPE  04/12/1999   @ [facility] by dr fredrik sharps;    polypectomy   LITHOTRIPSY     TONSILLECTOMY AND ADENOIDECTOMY  1946   TUBAL LIGATION Bilateral 1979   Patient Active Problem List   Diagnosis Date Noted   Medication monitoring encounter 08/09/2022   Overactive bladder 10/18/2021   Recurrent UTI 10/18/2021   Depression 06/17/2016   Gastroesophageal reflux disease without esophagitis    Chest pain at rest 06/16/2016   Cough 06/16/2016   Hyperglycemia 06/16/2016   Calculus of kidney 04/16/2013   Urge incontinence 04/16/2013   Urinary urgency 04/16/2013   Infection of urinary tract 04/16/2013    PCP: Dwight Trula SQUIBB, MD  REFERRING PROVIDER: Zuleta, Kaitlin G, NP   REFERRING DIAG: N81.10 (ICD-10-CM) - Prolapse of anterior vaginal wall N32.81 (ICD-10-CM) - Overactive bladder  THERAPY DIAG:  Other low back pain  Muscle weakness (generalized)  Rationale for Evaluation and Treatment: Rehabilitation  ONSET DATE: last year  SUBJECTIVE:  SUBJECTIVE STATEMENT: Patient reports back is feeling okay today. She hasn't noticed many changes in her pelvic floor. She has been using the complete emptying techniques from last visit. She is still leaking urine constantly - still changing 7 times per day, wearing depends at night. She hasn't been feeling the prolapse much recently.    From eval: Patient reports to PFPT with overactive bladder/prolapse symptoms. At the end of last year, her lower back was in a lot of pain and she has been dealing with this since in other PT. She has had injections for her back pain  but they do not last. She was scheduled for a sacral stimulator placement yesterday, but that has been cancelled until her back pain is under more control. She deals with urinary incontinence on a regular basis - she has tried pelvic PT, tibial nerve stimulation, botox , and other forms of treatment for her incontinence and has seen no improvement.   PAIN:  Are you having pain? Yes NPRS scale: 7/10 Pain location: low back pain - no pelvic pain at rest  PRECAUTIONS: None  RED FLAGS: None   WEIGHT BEARING RESTRICTIONS: No  FALLS:  Has patient fallen in last 6 months? No  OCCUPATION: retired  ACTIVITY LEVEL : would like to get back to a regular routine after her back pain is resolved some - enjoys going to [employer] and working on machines. She enjoys walking her dog daily and she has had to slow down.   PLOF: Independent with basic ADLs  PATIENT GOALS: decrease urinary incontinence and address prolapse symptoms   PERTINENT HISTORY:  Is being currently considered to undergo sacral nerve stimulator insertion stage 1 once she learns more about back pain   BOWEL MOVEMENT: no issues with bowel movements  Pain with bowel movement: No Type of bowel movement:Type (Bristol Stool Scale) 4, Frequency within normal limits , Strain no, and Splinting no Fully empty rectum: Yes:   Leakage: No Pads: No Fiber supplement/laxative No  URINATION: Pain with urination: No Fully empty bladder: Nosometimes she feels like there is more that can come out  Stream: Strong Urgency: Yes - but it has changed over the years - urgency comes on more suddenly  Frequency: more than within normal limits - doesn't get up to void anymore - wears depends in the nighttime  Leakage: Walking to the bathroom, Coughing, Exercise, Lifting, and Bending forward Pads: Yes: wears depends at night and wakes up with them soaked - wears pads during the day, changes it 6-8 times per day  Has a hx of chronic UTIs that are managed  by medications.  INTERCOURSE: not currently sexually active   PREGNANCY: Vaginal deliveries 1 Episiotomy Yes   PROLAPSE: Pressure  OBJECTIVE:  Note: Objective measures were completed at Evaluation unless otherwise noted.  PATIENT SURVEYS:  PFIQ-7: 42  COGNITION: Overall cognitive status: Within functional limits for tasks assessed     SENSATION: Light touch: Appears intact  LUMBAR SPECIAL TESTS:  Single leg stance test: Positive  FUNCTIONAL TESTS:  Squat: only able to reach 50% of squat depth, general lumbopelvic stiffness present, bilateral dynamic knee valgus with loading   GAIT: Assistive device utilized: none Comments: mild trendelenburg gait pattern with ambulation   POSTURE: increased thoracic kyphosis  LUMBARAROM/PROM:   A/PROM A/PROM  eval  Flexion 50% limited   Extension 50% limited  Right lateral flexion 25% limited  Left lateral flexion 25% limited  Right rotation 25% limited  Left rotation 25% limited   (Blank  rows = not tested)  LOWER EXTREMITY ROM: within functional limits   LOWER EXTREMITY MMT: 4/5 bilateral knees and hips grossly   PALPATION:   General: no significant tenderness to palpation of bilateral adductors or hip flexors   Pelvic Alignment: within normal limits   Abdominal: upper chest breathing, abdominal bracing at rest, decreased lower rib excursion with inhalation                 External Perineal Exam: internal vaginal exam deferred today due to time limitations, will assess at follow up                              Internal Pelvic Floor: weakness throughout superficial and deep pelvic floor musculature bilaterally. Lack of coordination present throughout with diaphragmatic breathing interventions.  Patient confirms identification and approves PT to assess internal pelvic floor and treatment Yes No emotional/communication barriers or cognitive limitation. Patient is motivated to learn. Patient understands and agrees with  treatment goals and plan. PT explains patient will be examined in standing, sitting, and lying down to see how their muscles and joints work. When they are ready, they will be asked to remove their underwear so PT can examine their perineum. The patient is also given the option of providing their own chaperone as one is not provided in our facility. The patient also has the right and is explained the right to defer or refuse any part of the evaluation or treatment including the internal exam. With the patient's consent, PT will use one gloved finger to gently assess the muscles of the pelvic floor, seeing how well it contracts and relaxes and if there is muscle symmetry. After, the patient will get dressed and PT and patient will discuss exam findings and plan of care. PT and patient discuss plan of care, schedule, attendance policy and HEP activities.   PELVIC MMT:     MMT eval  Vaginal 3/5, 10 quick flicks, 5 sec hold  Internal Anal Sphincter   External Anal Sphincter   Puborectalis   Diastasis Recti   (Blank rows = not tested)  TONE: Low bilaterally   PROLAPSE: None present in hooklying position   TODAY'S TREATMENT:                                                                                                                              DATE:   EVAL 01/10/24: Examination completed, findings reviewed, pt educated on POC, HEP, and self care: Pt motivated to participate in PT and agreeable to attempt recommendations.  Neuro re-ed: Toileting mechanics for optimal emptying of bladder/bowels  Blow as you go technique for relieving pelvic strain with daily tasks   02/21/24: Neuro re-ed/manual therapy/self care Toileting mechanics for optimal emptying of bladder/bowels  Blow as you go technique for relieving pelvic strain with daily tasks  Internal vaginal examination and assessment of pelvic floor musculature  -  Supine Pelvic Floor Contraction  - 1 x daily - 7 x weekly - 2 sets - 10 reps -  Quick Flick Pelvic Floor Contractions in Hooklying  - 1 x daily - 7 x weekly - 2 sets - 10 reps - Supine Butterfly Groin Stretch  - 1 x daily - 7 x weekly - 2 sets - hold - Supine Lower Trunk Rotation  - 1 x daily - 7 x weekly - 2 sets - hold - Supine Single Knee to Chest Stretch  - 1 x daily - 7 x weekly - 2 sets - hold - Supine Figure 4 Piriformis Stretch  - 1 x daily - 7 x weekly - 2 sets - hold  Patient Education - Urinary Urge Control Techniques - Urinary Urge Control Techniques  PATIENT EDUCATION:  Education details: relative anatomy and the connection between the diaphragm and pelvic floor, bladder irritants and water  intake recommendations  Person educated: Patient Education method: Explanation, Demonstration, Tactile cues, Verbal cues, and Handouts Education comprehension: verbalized understanding, returned demonstration, verbal cues required, tactile cues required, and needs further education  HOME EXERCISE PROGRAM: Access Code: [email or web address] URL: [email or web address] Date: 02/21/2024 Prepared by: Celena Domino  Exercises - Supine Pelvic Floor Contraction  - 1 x daily - 7 x weekly - 2 sets - 10 reps - Quick Flick Pelvic Floor Contractions in Hooklying  - 1 x daily - 7 x weekly - 2 sets - 10 reps - Supine Butterfly Groin Stretch  - 1 x daily - 7 x weekly - 2 sets - hold - Supine Lower Trunk Rotation  - 1 x daily - 7 x weekly - 2 sets - hold - Supine Single Knee to Chest Stretch  - 1 x daily - 7 x weekly - 2 sets - hold - Supine Figure 4 Piriformis Stretch  - 1 x daily - 7 x weekly - 2 sets - hold  Patient Education - Urinary Urge Control Techniques - Urinary Urge Control Techniques  ASSESSMENT:  CLINICAL IMPRESSION: Patient is a 83 y.o. female  who was seen today for physical therapy evaluation and treatment for vaginal prolapse and urinary incontinence. Patient fully consents to today's internal examination which revealed  weakness throughout superficial and deep pelvic floor musculature bilaterally. Lack of coordination present throughout with diaphragmatic breathing interventions. Introduction to deep diaphragmatic breathing with pelvic floor range of motion training required max cueing from PT, but patient reports thorough understanding of HEP. Pt would benefit from additional PT to further address deficits.    OBJECTIVE IMPAIRMENTS: decreased coordination, decreased endurance, decreased mobility, decreased ROM, and decreased strength.   ACTIVITY LIMITATIONS: continence  PARTICIPATION LIMITATIONS: community activity  PERSONAL FACTORS: Age, Past/current experiences, and Time since onset of injury/illness/exacerbation are also affecting patient's functional outcome.   REHAB POTENTIAL: Good  CLINICAL DECISION MAKING: Stable/uncomplicated  EVALUATION COMPLEXITY: Low   GOALS: Goals reviewed with patient? Yes  SHORT TERM GOALS: Target date: 02/07/2024  Pt will be independent with HEP.  Baseline: Goal status: INITIAL  2.  Pt will be independent with diaphragmatic breathing and down training activities in order to improve pelvic floor relaxation. Baseline:  Goal status: INITIAL  3.  Pt will be independent with the knack, urge suppression technique, and double voiding in order to improve bladder habits and decrease urinary incontinence.   Baseline:  Goal status: INITIAL  4.  Pt will have 50% reduced leakage during a typical day  Baseline:  Goal status:  INITIAL  LONG TERM GOALS: Target date: 07/12/2024  Pt will be independent with advanced HEP.  Baseline:  Goal status: INITIAL  2.  Pt to demonstrate improved coordination of pelvic floor and breathing mechanics with 10# squat with appropriate synergistic patterns to decrease pain and leakage at least 75% of the time for improved ability to complete a 30 minute workout with strain at pelvic floor and symptoms.   Baseline:  Goal status: INITIAL  3.   Pt will have 75% reduced leakage during a typical day to improve quality of life and decrease pad usage throughout the day.  Baseline:  Goal status: INITIAL  4.  Pt to demonstrate at least 4+/5 bil hip strength for improved pelvic stability and functional squats without leakage.  Baseline:  Goal status: INITIAL  5.  Pt will be able to correctly perform diaphragmatic breathing and appropriate pressure management in order to prevent worsening vaginal wall laxity and improve pelvic floor A/ROM.   Baseline:  Goal status: INITIAL  PLAN:  PT FREQUENCY: 1-2x/week  PT DURATION: 12 weeks  PLANNED INTERVENTIONS: 97110-Therapeutic exercises, 97530- Therapeutic activity, 97112- Neuromuscular re-education, 97535- Self Care, [id number]- Manual therapy, Patient/Family education, Taping, Joint mobilization, Spinal mobilization, Scar mobilization, Cryotherapy, and Moist heat  PLAN FOR NEXT SESSION: internal vaginal examination of pelvic floor musculature   Celena JAYSON Domino, PT 02/21/2024, 3:33 PM

## 2024-02-25 ENCOUNTER — Other Ambulatory Visit: Payer: Self-pay | Admitting: Orthopedic Surgery

## 2024-02-25 ENCOUNTER — Ambulatory Visit (HOSPITAL_BASED_OUTPATIENT_CLINIC_OR_DEPARTMENT_OTHER)

## 2024-02-25 ENCOUNTER — Other Ambulatory Visit: Payer: Self-pay | Admitting: Obstetrics and Gynecology

## 2024-02-25 ENCOUNTER — Other Ambulatory Visit: Payer: Self-pay

## 2024-02-25 ENCOUNTER — Encounter (HOSPITAL_BASED_OUTPATIENT_CLINIC_OR_DEPARTMENT_OTHER): Payer: Self-pay

## 2024-02-25 ENCOUNTER — Other Ambulatory Visit (HOSPITAL_BASED_OUTPATIENT_CLINIC_OR_DEPARTMENT_OTHER): Payer: Self-pay

## 2024-02-25 DIAGNOSIS — M6281 Muscle weakness (generalized): Secondary | ICD-10-CM | POA: Diagnosis not present

## 2024-02-25 DIAGNOSIS — G8929 Other chronic pain: Secondary | ICD-10-CM | POA: Diagnosis not present

## 2024-02-25 DIAGNOSIS — N390 Urinary tract infection, site not specified: Secondary | ICD-10-CM

## 2024-02-25 DIAGNOSIS — M5459 Other low back pain: Secondary | ICD-10-CM | POA: Diagnosis not present

## 2024-02-25 DIAGNOSIS — M25512 Pain in left shoulder: Secondary | ICD-10-CM | POA: Diagnosis not present

## 2024-02-25 MED ORDER — PREGABALIN 25 MG PO CAPS
25.0000 mg | ORAL_CAPSULE | Freq: Two times a day (BID) | ORAL | 0 refills | Status: DC
  Filled 2024-03-03: qty 60, 30d supply, fill #0

## 2024-02-25 MED ORDER — SULFAMETHOXAZOLE-TRIMETHOPRIM 400-80 MG PO TABS
0.5000 | ORAL_TABLET | Freq: Every day | ORAL | 0 refills | Status: DC
  Filled 2024-03-03: qty 50, 100d supply, fill #0

## 2024-02-25 NOTE — Addendum Note (Signed)
 Addended by: GERIANNE HARLENE BROCKS on: 02/25/2024 05:54 PM   Modules accepted: Orders

## 2024-02-25 NOTE — Therapy (Signed)
 OUTPATIENT PHYSICAL THERAPY TREATMENT      Patient Name: Vanessa Trujillo MRN: [id number] DOB:10-Dec-1940, 83 y.o., female Today's Date: 02/25/2024  END OF SESSION:  PT End of Session - 02/25/24 1152     Visit Number 8    Number of Visits 10    Date for PT Re-Evaluation 02/07/24    Authorization Type [employer]    Authorization Time Period 12 visits approved  01/19/24-02/27/24    Authorization - Visit Number 8    Authorization - Number of Visits 12    Progress Note Due on Visit 16    PT Start Time 1154   pt arrived late   PT Stop Time 1228    PT Time Calculation (min) 34 min    Activity Tolerance Patient tolerated treatment well;Patient limited by pain    Behavior During Therapy [facility] for tasks assessed/performed               Past Medical History:  Diagnosis Date   Allergic rhinitis    Aortic atherosclerosis (HCC)    Chronic UTI    DDD (degenerative disc disease), lumbar    Diverticulosis of colon    GERD (gastroesophageal reflux disease)    Hematuria    Hiatal hernia    large per 06/05/23 CT A/P in Epic   History of adenomatous polyp of colon    History of hepatitis    age 84, hospitalized   History of kidney stones    Hyperlipidemia    results in epic;   NUC 06-18-2016  LR w/ nuclear ef 82%;  echo 06-18-2026 normal w/ ef 60-65%;  CTC 08-21-2018  calcium score= 0.9, LR   Macular degeneration of both eyes    OAB (overactive bladder)    Follows w/ [facility].   Osteopenia    PONV (postoperative nausea and vomiting)    TMJ (sprain of temporomandibular joint)    around 2018   Urge incontinence    Uterine fibroid    Vitamin B12 deficiency    Vitamin D  deficiency    Past Surgical History:  Procedure Laterality Date   APPENDECTOMY  1959   CATARACT EXTRACTION W/ INTRAOCULAR LENS IMPLANT Right 09/08/2015   Dr. Camillo   CHOLECYSTECTOMY  1989   COLONOSCOPY WITH PROPOFOL   04/08/2012   dr donnald   CYSTOSCOPY N/A 07/16/2023   Procedure:  CYSTOSCOPY with hydrodistention and intravesical botox  injection (100 units);  Surgeon: Marilynne Rosaline SAILOR, MD;  Location: [facility];  Service: Gynecology;  Laterality: N/A;   DILATATION & CURRETTAGE/HYSTEROSCOPY WITH RESECTOCOPE  04/12/1999   @ [facility] by dr fredrik sharps;    polypectomy   LITHOTRIPSY     TONSILLECTOMY AND ADENOIDECTOMY  1946   TUBAL LIGATION Bilateral 1979   Patient Active Problem List   Diagnosis Date Noted   Medication monitoring encounter 08/09/2022   Overactive bladder 10/18/2021   Recurrent UTI 10/18/2021   Depression 06/17/2016   Gastroesophageal reflux disease without esophagitis    Chest pain at rest 06/16/2016   Cough 06/16/2016   Hyperglycemia 06/16/2016   Calculus of kidney 04/16/2013   Urge incontinence 04/16/2013   Urinary urgency 04/16/2013   Infection of urinary tract 04/16/2013    REFERRING PROVIDER: Genelle Standing, MD   REFERRING DIAG:  M53.3,G89.29 (ICD-10-CM) - Chronic left SI joint pain   Rationale for Evaluation and Treatment: Rehabilitation  THERAPY DIAG:  Chronic left shoulder pain  Muscle weakness (generalized)  Other low back pain  ONSET DATE: Dec 2024/Jan  2025   SUBJECTIVE:                                                                                                                                                                                           SUBJECTIVE STATEMENT:  Pt reports she feels her progress has been up and down. Whenever I bend down to pick something up, it sends my back into spasms. Feels that sometimes the exercises flare her up.   Re-evaluation performed on 01/19/2024 for dx of SIJD.  Very stiff across the back. AM stiffness- not as bad as it was before the injection but still bad. What worries me the most is the right leg- like it won't support me. I can tell I have lost a lot of strength. I do my exercises every morning to decrease stiffness.  The pain in my left shoulder region is  really back. Upper back tends to cramp as I am doing things.   PERTINENT HISTORY:  Chronic back pain  PAIN:  Are you having pain? Yes: NPRS scale: severe Pain location: across low back Pain description: tight Aggravating factors: AM Relieving factors: movement  PRECAUTIONS:  None  RED FLAGS: None   WEIGHT BEARING RESTRICTIONS:  No  FALLS:  Has patient fallen in last 6 months? No   PLOF:  Independent  PATIENT GOALS:  Decrease pain   OBJECTIVE:  Note: Objective measures were completed at Evaluation unless otherwise noted.  DIAGNOSTIC FINDINGS:  MRI 6/10 IMPRESSION: Mild retrolisthesis of L3 on L4 and moderate degenerative disc disease with mild endplate marrow edema.   Multilevel degenerative spondylosis with levels described in detail above.   No disc protrusion or evidence of impingement   PATIENT SURVEYS:  Modified Oswestry:  MODIFIED OSWESTRY DISABILITY SCALE   Date: 7/12 Score 02/11/24  Pain intensity 4 =  Pain medication provides me with little relief from pain. 3  2. Personal care (washing, dressing, etc.) 2 =  It is painful to take care of myself, and I am slow and careful. 1  3. Lifting 4 = I can lift only very light weights 2  4. Walking 3 =  Pain prevents me from walking more than  mile. 1  5. Sitting 3 =  Pain prevents me from sitting more than  hour. 0  6. Standing 4 =  Pain prevents me from standing more than 10 minutes. 4  7. Sleeping 2 =  Even when I take pain medication, I sleep less than 6 hours 1  8. Social Life 3 =  Pain prevents me from going out very often. 2  9. Traveling 4 = My pain  restricts my travel to short necessary journeys under 1/2 hour. 2  10. Employment/ Homemaking 3 = Pain prevents me from doing anything but light duties. 2  Total 32/50 34%   Interpretation of scores: Score Category Description  0-20% Minimal Disability The patient can cope with most living activities. Usually no treatment is indicated apart from advice  on lifting, sitting and exercise  21-40% Moderate Disability The patient experiences more pain and difficulty with sitting, lifting and standing. Travel and social life are more difficult and they may be disabled from work. Personal care, sexual activity and sleeping are not grossly affected, and the patient can usually be managed by conservative means  41-60% Severe Disability Pain remains the main problem in this group, but activities of daily living are affected. These patients require a detailed investigation  61-80% Crippled Back pain impinges on all aspects of the patient's life. Positive intervention is required  81-100% Bed-bound  These patients are either bed-bound or exaggerating their symptoms  Bluford FORBES Zoe DELENA Karon DELENA, et al. Surgery versus conservative management of stable thoracolumbar fracture: the PRESTO feasibility RCT. [place]): [employer]; 2021 Nov. [facility] Technology Assessment, No. 25.62.) Appendix 3, Oswestry Disability Index category descriptors. Available from: FindJewel[employer].cz  Minimally Clinically Important Difference (MCID) = 12.8%  COGNITIVE STATUS: Within functional limits for tasks assessed   SENSATION: WFL   POSTURE:  Eval- seated: shifted to the left with left shoulder elevation, bilateral rounded shoulders. Standing: Rt ASIS elevation, left scapular elevation, incr thoracic kyphosis, Rt scapular winging  GAIT: 01/19/24 Comments: Rt trendelenburg, slow cadence, lacking trunk rotation   Body Part #1 Hip    LOWER EXTREMITY MMT:     MMT (lb) Right eval Left eval Rt/Lt 4/9 Rt/Lt 5/5 Rt/lt 8/4  Hip flexion           Hip extension           Hip abduction (SL at knee) 15.9 6.4 12.5/11.1 14.9/21.2 25.4/27.0  Hip adduction           Hip internal rotation           Hip external rotation           Knee flexion 12.0 10.2 16.4/12.2 23.9/20.5 29.5/23.3  Knee extension 26.3 11.0 32.1/12.7 29.2/21.4  31.1/28.2   (Blank rows = not tested)                                                                                                                              TREATMENT DATE:   8/18 STM to periscapular mm STM to thoracic paraspinals in prone Quadruped cat stretch 5 x5 (bothered wrist and arms) Posterior shoulder rolls x10 Seated thoracic rotation with deep breathing 5 x5ea OH reach with deep breathing x3ea Scapular depression isometric- seated on plinth pushing hand into yoga block 5 x5ea Seated W x10 Seated rhomboid stretch Thoracic extension seated in chair 5 x5 Pec stretch at wall 2x15sec L only  02/11/24 STM to periscapular mm Sidelying scapulothoracic mobilizations Row RTB 2x15 (tactile cues) Protraction in s/l 2x10 with cuing Supine protraction 2x10 Updated MMT Updated goals MODI   02/05/24: Discussed anatomy of condition & nervous pain vs MSK pain & input of lyrica /other meds Sidelying STM along scapular medial border, deep breathing + scap distraciton, scap mobilizations Sidelying scap protraction/retraction Supine protraction reach at 90 deg flexion- required some manual work in this position as pt c/o referred pain into lower thoracic region Wall push ups +  01/28/24:  Manual:  TPDN YES Trigger Point Dry Needling  Subsequent Treatment: Instructions provided previously at initial dry needling treatment.   Patient Verbal Consent Given: Yes Education Handout Provided: Previously Provided Muscles Treated: Lt upper trap & levator Electrical Stimulation Performed: No Treatment Response/Outcome: twitch with depression of scapula and decreased concordant pain  Supine rib cage expansion breathing- circumferential - added yellow tband around ribs for tactile cues  Added iso add with exhale  Added mini bridge on heels with exhale   01/25/24 STM and TPR to L scapular region in s/l with pillow b/w knees -S/l clams x20ea -UT stretching 2x30seconds  L -seated rhomboid stretching 2x4breaths -sit to stands from elevated plinth 2x5 (cues for breath) -Lumbar shift- left shoulder on wall hips move right to left    01/19/24 Lumbar shift- left shoulder on wall hips move right to left Gait- lacking progression of left shoulder in trunk rotation Breathing with Lt lower rib cage depression- expansion of right lateral, inf ribs Lift in left shoe, 3 layer   PATIENT EDUCATION:  Education details: Anatomy of condition, POC, HEP, exercise form/rationale Person educated: Patient Education method: Explanation, Demonstration, Tactile cues, Verbal cues, and Handouts Education comprehension: verbalized understanding, returned demonstration, verbal cues required, tactile cues required, and needs further education  HOME EXERCISE PROGRAM: Wear lift, Rt rib cage expansion/Lt anchor  [id number]   ASSESSMENT:  CLINICAL IMPRESSION:  Pt remains tight and tender within thoracic and periscapular mm. Spent time on STM to these areas to decrease restrictions. Worked on [employer] of scapular depressors as well as thoracic mobility exercises. Pt with significant pec tightness so instructed her in gentle wall pec stretch . Will assess response to interventions at next visit and progress as tolerated.   Re-eval: Patient is a 83 y.o. F who was seen today for physical therapy re-evaluation and treatment for chronic LBP and Lt shoulder pain. Lt shoudler pain is consistent with spasm around scapula from elevated posture. Added heel lift to left shoe and began correcting lumbar shift. Pt was able to improve trunk rotation in gait today with tactile cues and will continue to work on this.     OBJECTIVE IMPAIRMENTS: Abnormal gait, decreased activity tolerance, decreased balance, decreased endurance, difficulty walking, decreased ROM, decreased strength, increased muscle spasms, improper body mechanics, and pain.   ACTIVITY LIMITATIONS: carrying, lifting, bending, sitting,  standing, squatting, sleeping, stairs, transfers, bed mobility, dressing, reach over head, and locomotion level  PARTICIPATION LIMITATIONS: meal prep, cleaning, laundry, driving, shopping, community activity, and yard work  PERSONAL FACTORS: Fitness and Time since onset of injury/illness/exacerbation are also affecting patient's functional outcome.   REHAB POTENTIAL: Good  CLINICAL DECISION MAKING: Evolving/moderate complexity  EVALUATION COMPLEXITY: Moderate   GOALS: Goals reviewed with patient? Yes  SHORT TERM GOALS: Target date: 8/2  Able to demo stacked posture without shift Baseline: Goal status: MET 7/29   LONG TERM GOALS: Target date: POC date  Ambulation with trunk rotation and resolution of trendelenburg Baseline:  Goal status: IN PROGRESS 02/11/24  2.  Mod oswestry to improve by MDC Baseline:  Goal status: NOT MET 8/4  3.  Hip abd strength 85% right to left Baseline:  Goal status: MET (94.1% on 8/4)  4.  Pt will verbalize improvement in ability to get out of bed in the morning with reduced stiffness Baseline:  Goal status: IN PROGRESS (Improving, not quite full improvement)8/4  5.  Return to walking program Baseline:  Goal status: IN PROGRESS (walked a mile with rest breaks 8/4)    PLAN:  PT FREQUENCY: 1-2x/week  PT DURATION: POC Date  PLANNED INTERVENTIONS: 97164- PT Re-evaluation, 97750- Physical Performance Testing, 97110-Therapeutic exercises, 97530- Therapeutic activity, [id number]- Neuromuscular re-education, 97535- Self Care, [id number]- Manual therapy, [id number]- Gait training, [phone]- Aquatic Therapy, [phone] (1-2 muscles), [id number] (3+ muscles)- Dry Needling, Patient/Family education, Balance training, Stair training, Taping, Joint mobilization, Spinal mobilization, and Cryotherapy.  PLAN FOR NEXT SESSION: hip MMT with hand dyno, check for need for shift correction, manual in bilateral periscapular regions- will DN when with a PT   Asberry FORBES Rodes,  PTA 02/25/2024, 1:58 PM  [employer] Medicare Authorization form:  Date 7/12  What was this (referring dx) caused by? Unspecified  Nature of Condition: Chronic (continuous duration > 3 months)  Current Functional Measure Score: See objective section of note  Objective measurements identify impairments when they are compared to normal values, the uninvolved extremity, and prior level of function.  [x]  Yes  []  No  Objective assessment of functional ability: Moderate functional limitations   Briefly describe symptoms: see subjective  How did symptoms start: insidious  Average pain intensity:  Last 24 hours: 8/10  Past week: 8/10  How often does the pt experience symptoms? Frequently  How much have the symptoms interfered with usual daily activities? Quite a bit  How is the condition changing,since beginning care at this facility? 5 A little better (7/12 is re-eval dae)  In general, how is the patients overall health? Good   BACK PAIN (STarT Back Screening Tool) Has pain spread down the leg(s) at some time in the last 2 weeks? no Has there been pain in the shoulder or neck at some time in the last 2 weeks? yes Has the pt only walked short distances because of back pain? yes Has patient dressed more slowly because of back pain in the past 2 weeks? yes Does patient think it's not safe for a person with this condition to be physically active? yes Does patient have worrying thoughts a lot of the time? no Does patient feel back pain is terrible and will never get any better? no Has patient stopped enjoying things they usually enjoy? yes Overall, how bothersome has back pain been in the last 2 weeks?                    Very Much

## 2024-02-26 ENCOUNTER — Telehealth: Payer: Self-pay

## 2024-02-26 ENCOUNTER — Other Ambulatory Visit (HOSPITAL_BASED_OUTPATIENT_CLINIC_OR_DEPARTMENT_OTHER): Payer: Self-pay

## 2024-02-26 ENCOUNTER — Encounter (HOSPITAL_BASED_OUTPATIENT_CLINIC_OR_DEPARTMENT_OTHER): Payer: Self-pay

## 2024-02-26 MED ORDER — PREGABALIN 25 MG PO CAPS
25.0000 mg | ORAL_CAPSULE | Freq: Three times a day (TID) | ORAL | 1 refills | Status: DC
  Filled 2024-02-26: qty 90, 30d supply, fill #0
  Filled 2024-03-23 – 2024-03-26 (×2): qty 90, 30d supply, fill #1

## 2024-02-26 NOTE — Telephone Encounter (Signed)
 Rewrote lyrica  for 3x per day

## 2024-02-27 ENCOUNTER — Other Ambulatory Visit: Payer: Self-pay

## 2024-02-27 ENCOUNTER — Other Ambulatory Visit (HOSPITAL_BASED_OUTPATIENT_CLINIC_OR_DEPARTMENT_OTHER): Payer: Self-pay

## 2024-03-03 ENCOUNTER — Other Ambulatory Visit (HOSPITAL_BASED_OUTPATIENT_CLINIC_OR_DEPARTMENT_OTHER): Payer: Self-pay

## 2024-03-03 ENCOUNTER — Other Ambulatory Visit: Payer: Self-pay

## 2024-03-03 ENCOUNTER — Ambulatory Visit (HOSPITAL_BASED_OUTPATIENT_CLINIC_OR_DEPARTMENT_OTHER)

## 2024-03-03 ENCOUNTER — Encounter (HOSPITAL_BASED_OUTPATIENT_CLINIC_OR_DEPARTMENT_OTHER): Payer: Self-pay

## 2024-03-03 DIAGNOSIS — M5459 Other low back pain: Secondary | ICD-10-CM | POA: Diagnosis not present

## 2024-03-03 DIAGNOSIS — G8929 Other chronic pain: Secondary | ICD-10-CM

## 2024-03-03 DIAGNOSIS — M25512 Pain in left shoulder: Secondary | ICD-10-CM | POA: Diagnosis not present

## 2024-03-03 DIAGNOSIS — M6281 Muscle weakness (generalized): Secondary | ICD-10-CM | POA: Diagnosis not present

## 2024-03-03 NOTE — Therapy (Cosign Needed Addendum)
 OUTPATIENT PHYSICAL THERAPY TREATMENT         Patient Name: Vanessa Trujillo MRN: [id number] DOB:[birth year], 83 y.o., female Today's Date: 03/03/2024  END OF SESSION:  PT End of Session - 03/03/24 1210     Visit Number 9    Number of Visits 10    Date for PT Re-Evaluation 03/03/2024   Authorization Type [employer]    Authorization Time Period 12 visits approved  01/19/24-02/27/24    Authorization - Visit Number 9    Authorization - Number of Visits 12    Progress Note Due on Visit 16    PT Start Time 1150    PT Stop Time 1230    PT Time Calculation (min) 40 min    Activity Tolerance Patient limited by pain    Behavior During Therapy [facility] for tasks assessed/performed                Past Medical History:  Diagnosis Date   Allergic rhinitis    Aortic atherosclerosis (HCC)    Chronic UTI    DDD (degenerative disc disease), lumbar    Diverticulosis of colon    GERD (gastroesophageal reflux disease)    Hematuria    Hiatal hernia    large per 06/05/23 CT A/P in Epic   History of adenomatous polyp of colon    History of hepatitis    age 9, hospitalized   History of kidney stones    Hyperlipidemia    results in epic;   NUC 06-18-2016  LR w/ nuclear ef 82%;  echo 06-18-2026 normal w/ ef 60-65%;  CTC 08-21-2018  calcium score= 0.9, LR   Macular degeneration of both eyes    OAB (overactive bladder)    Follows w/ [facility].   Osteopenia    PONV (postoperative nausea and vomiting)    TMJ (sprain of temporomandibular joint)    around 2018   Urge incontinence    Uterine fibroid    Vitamin B12 deficiency    Vitamin D  deficiency    Past Surgical History:  Procedure Laterality Date   APPENDECTOMY  1959   CATARACT EXTRACTION W/ INTRAOCULAR LENS IMPLANT Right 09/08/2015   Dr. Camillo   CHOLECYSTECTOMY  1989   COLONOSCOPY WITH PROPOFOL   04/08/2012   dr donnald   CYSTOSCOPY N/A 07/16/2023   Procedure: CYSTOSCOPY with hydrodistention and  intravesical botox  injection (100 units);  Surgeon: Marilynne Rosaline SAILOR, MD;  Location: [facility];  Service: Gynecology;  Laterality: N/A;   DILATATION & CURRETTAGE/HYSTEROSCOPY WITH RESECTOCOPE  04/12/1999   @ [facility] by dr fredrik sharps;    polypectomy   LITHOTRIPSY     TONSILLECTOMY AND ADENOIDECTOMY  1946   TUBAL LIGATION Bilateral 1979   Patient Active Problem List   Diagnosis Date Noted   Medication monitoring encounter 08/09/2022   Overactive bladder 10/18/2021   Recurrent UTI 10/18/2021   Depression 06/17/2016   Gastroesophageal reflux disease without esophagitis    Chest pain at rest 06/16/2016   Cough 06/16/2016   Hyperglycemia 06/16/2016   Calculus of kidney 04/16/2013   Urge incontinence 04/16/2013   Urinary urgency 04/16/2013   Infection of urinary tract 04/16/2013    REFERRING PROVIDER: Genelle Standing, MD   REFERRING DIAG:  M53.3,G89.29 (ICD-10-CM) - Chronic left SI joint pain   Rationale for Evaluation and Treatment: Rehabilitation  THERAPY DIAG:  Chronic left shoulder pain  Muscle weakness (generalized)  Other low back pain  ONSET DATE: Dec 2024/Jan 2025   SUBJECTIVE:  SUBJECTIVE STATEMENT:  Pt reports ongoing spasms in shoulder blade. Skin is hypersensitive over shoulder. Taking Lyrica .   Re-evaluation performed on 01/19/2024 for dx of SIJD.  Very stiff across the back. AM stiffness- not as bad as it was before the injection but still bad. What worries me the most is the right leg- like it won't support me. I can tell I have lost a lot of strength. I do my exercises every morning to decrease stiffness.  The pain in my left shoulder region is really back. Upper back tends to cramp as I am doing things.   PERTINENT HISTORY:  Chronic back pain  PAIN:  Are you  having pain? Yes: NPRS scale: severe Pain location: across low back Pain description: tight Aggravating factors: AM Relieving factors: movement  PRECAUTIONS:  None  RED FLAGS: None   WEIGHT BEARING RESTRICTIONS:  No  FALLS:  Has patient fallen in last 6 months? No   PLOF:  Independent  PATIENT GOALS:  Decrease pain   OBJECTIVE:  Note: Objective measures were completed at Evaluation unless otherwise noted.  DIAGNOSTIC FINDINGS:  MRI 6/10 IMPRESSION: Mild retrolisthesis of L3 on L4 and moderate degenerative disc disease with mild endplate marrow edema.   Multilevel degenerative spondylosis with levels described in detail above.   No disc protrusion or evidence of impingement   PATIENT SURVEYS:  Modified Oswestry:  MODIFIED OSWESTRY DISABILITY SCALE   Date: 7/12 Score 02/11/24  Pain intensity 4 =  Pain medication provides me with little relief from pain. 3  2. Personal care (washing, dressing, etc.) 2 =  It is painful to take care of myself, and I am slow and careful. 1  3. Lifting 4 = I can lift only very light weights 2  4. Walking 3 =  Pain prevents me from walking more than  mile. 1  5. Sitting 3 =  Pain prevents me from sitting more than  hour. 0  6. Standing 4 =  Pain prevents me from standing more than 10 minutes. 4  7. Sleeping 2 =  Even when I take pain medication, I sleep less than 6 hours 1  8. Social Life 3 =  Pain prevents me from going out very often. 2  9. Traveling 4 = My pain restricts my travel to short necessary journeys under 1/2 hour. 2  10. Employment/ Homemaking 3 = Pain prevents me from doing anything but light duties. 2  Total 32/50 34%   Interpretation of scores: Score Category Description  0-20% Minimal Disability The patient can cope with most living activities. Usually no treatment is indicated apart from advice on lifting, sitting and exercise  21-40% Moderate Disability The patient experiences more pain and difficulty with  sitting, lifting and standing. Travel and social life are more difficult and they may be disabled from work. Personal care, sexual activity and sleeping are not grossly affected, and the patient can usually be managed by conservative means  41-60% Severe Disability Pain remains the main problem in this group, but activities of daily living are affected. These patients require a detailed investigation  61-80% Crippled Back pain impinges on all aspects of the patient's life. Positive intervention is required  81-100% Bed-bound  These patients are either bed-bound or exaggerating their symptoms  Bluford FORBES Zoe DELENA Karon DELENA, et al. Surgery versus conservative management of stable thoracolumbar fracture: the PRESTO feasibility RCT. [place] ([place]): [employer]; 2021 Nov. [facility] Technology Assessment, No. 25.62.) Appendix 3, Oswestry Disability Index category  descriptors. Available from: FindJewel[employer].cz  Minimally Clinically Important Difference (MCID) = 12.8%  COGNITIVE STATUS: Within functional limits for tasks assessed   SENSATION: WFL   POSTURE:  Eval- seated: shifted to the left with left shoulder elevation, bilateral rounded shoulders. Standing: Rt ASIS elevation, left scapular elevation, incr thoracic kyphosis, Rt scapular winging  GAIT: 01/19/24 Comments: Rt trendelenburg, slow cadence, lacking trunk rotation   Body Part #1 Hip    LOWER EXTREMITY MMT:     MMT (lb) Right eval Left eval Rt/Lt 4/9 Rt/Lt 5/5 Rt/lt 8/4  Hip flexion           Hip extension           Hip abduction (SL at knee) 15.9 6.4 12.5/11.1 14.9/21.2 25.4/27.0  Hip adduction           Hip internal rotation           Hip external rotation           Knee flexion 12.0 10.2 16.4/12.2 23.9/20.5 29.5/23.3  Knee extension 26.3 11.0 32.1/12.7 29.2/21.4 31.1/28.2   (Blank rows = not tested)                                                                                                                               TREATMENT DATE:   8/25 STM to periscapular mm STM to thoracic paraspinals in seated position Static cupping to periscapular mm  Posterior shoulder rolls 2x10 Seated thoracic rotation with deep breathing 5 x5ea OH reach with deep breathing x3ea Scapular depression isometric- seated on plinth pushing hand into yoga block 5 x10 Seated W x10 Seated rhomboid stretch Thoracic extension seated in chair 5 x5   8/18 STM to periscapular mm STM to thoracic paraspinals in prone Quadruped cat stretch 5 x5 (bothered wrist and arms) Posterior shoulder rolls x10 Seated thoracic rotation with deep breathing 5 x5ea OH reach with deep breathing x3ea Scapular depression isometric- seated on plinth pushing hand into yoga block 5 x5ea Seated W x10 Seated rhomboid stretch Thoracic extension seated in chair 5 x5 Pec stretch at wall 2x15sec L only     02/11/24 STM to periscapular mm Sidelying scapulothoracic mobilizations Row RTB 2x15 (tactile cues) Protraction in s/l 2x10 with cuing Supine protraction 2x10 Updated MMT Updated goals MODI   02/05/24: Discussed anatomy of condition & nervous pain vs MSK pain & input of lyrica /other meds Sidelying STM along scapular medial border, deep breathing + scap distraciton, scap mobilizations Sidelying scap protraction/retraction Supine protraction reach at 90 deg flexion- required some manual work in this position as pt c/o referred pain into lower thoracic region Wall push ups +    PATIENT EDUCATION:  Education details: Teacher, music of condition, POC, HEP, exercise form/rationale Person educated: Patient Education method: Programmer, multimedia, Demonstration, Tactile cues, Verbal cues, and Handouts Education comprehension: verbalized understanding, returned demonstration, verbal cues required, tactile cues required, and needs further education  HOME EXERCISE PROGRAM: Wear lift, Rt rib cage  expansion/Lt  anchor  [id number]   ASSESSMENT:  CLINICAL IMPRESSION:  Pt remains hypersensitive to periscapular region and pectoral mm. Pt did report improved pain level and tightness following STM and cupping. Continued to work on postural re-ed especially for scapular retraction and depression. Pt will have re-assessment next visit to update current progress and goals. Pt plans to schedule appt with Dr. Genelle regarding her shoulder.   Re-eval: Patient is a 83 y.o. F who was seen today for physical therapy re-evaluation and treatment for chronic LBP and Lt shoulder pain. Lt shoudler pain is consistent with spasm around scapula from elevated posture. Added heel lift to left shoe and began correcting lumbar shift. Pt was able to improve trunk rotation in gait today with tactile cues and will continue to work on this.     OBJECTIVE IMPAIRMENTS: Abnormal gait, decreased activity tolerance, decreased balance, decreased endurance, difficulty walking, decreased ROM, decreased strength, increased muscle spasms, improper body mechanics, and pain.   ACTIVITY LIMITATIONS: carrying, lifting, bending, sitting, standing, squatting, sleeping, stairs, transfers, bed mobility, dressing, reach over head, and locomotion level  PARTICIPATION LIMITATIONS: meal prep, cleaning, laundry, driving, shopping, community activity, and yard work  PERSONAL FACTORS: Fitness and Time since onset of injury/illness/exacerbation are also affecting patient's functional outcome.   REHAB POTENTIAL: Good  CLINICAL DECISION MAKING: Evolving/moderate complexity  EVALUATION COMPLEXITY: Moderate   GOALS: Goals reviewed with patient? Yes  SHORT TERM GOALS: Target date: 8/2  Able to demo stacked posture without shift Baseline: Goal status: MET 7/29   LONG TERM GOALS: Target date: POC date  Ambulation with trunk rotation and resolution of trendelenburg Baseline:  Goal status: IN PROGRESS 02/11/24  2.  Mod oswestry to improve by  MDC Baseline:  Goal status: NOT MET 8/4  3.  Hip abd strength 85% right to left Baseline:  Goal status: MET (94.1% on 8/4)  4.  Pt will verbalize improvement in ability to get out of bed in the morning with reduced stiffness Baseline:  Goal status: IN PROGRESS (Improving, not quite full improvement)8/4  5.  Return to walking program Baseline:  Goal status: IN PROGRESS (walked a mile with rest breaks 8/4)    PLAN:  PT FREQUENCY: 1-2x/week  PT DURATION: POC Date  PLANNED INTERVENTIONS: 97164- PT Re-evaluation, 97750- Physical Performance Testing, 97110-Therapeutic exercises, 97530- Therapeutic activity, [id number]- Neuromuscular re-education, 97535- Self Care, [id number]- Manual therapy, [id number]- Gait training, [phone]- Aquatic Therapy, [phone] (1-2 muscles), [id number] (3+ muscles)- Dry Needling, Patient/Family education, Balance training, Stair training, Taping, Joint mobilization, Spinal mobilization, and Cryotherapy.  PLAN FOR NEXT SESSION: hip MMT with hand dyno, check for need for shift correction, manual in bilateral periscapular regions- will DN when with a PT   Asberry FORBES Rodes, PTA 03/03/2024, 1:26 PM  [employer] Medicare Authorization form:  Date 7/12  What was this (referring dx) caused by? Unspecified  Nature of Condition: Chronic (continuous duration > 3 months)  Current Functional Measure Score: See objective section of note  Objective measurements identify impairments when they are compared to normal values, the uninvolved extremity, and prior level of function.  [x]  Yes  []  No  Objective assessment of functional ability: Moderate functional limitations   Briefly describe symptoms: see subjective  How did symptoms start: insidious  Average pain intensity:  Last 24 hours: 8/10  Past week: 8/10  How often does the pt experience symptoms? Frequently  How much have the symptoms interfered with usual daily activities? Quite a bit  How is the condition changing,since beginning  care at this facility? 5 A little better (7/12 is re-eval dae)  In general, how is the patients overall health? Good   BACK PAIN (STarT Back Screening Tool) Has pain spread down the leg(s) at some time in the last 2 weeks? no Has there been pain in the shoulder or neck at some time in the last 2 weeks? yes Has the pt only walked short distances because of back pain? yes Has patient dressed more slowly because of back pain in the past 2 weeks? yes Does patient think it's not safe for a person with this condition to be physically active? yes Does patient have worrying thoughts a lot of the time? no Does patient feel back pain is terrible and will never get any better? no Has patient stopped enjoying things they usually enjoy? yes Overall, how bothersome has back pain been in the last 2 weeks?                    Very Much  Progress note to be completed by PT next visit.  Jessica C. Hightower PT, DPT 03/07/24 12:55 PM

## 2024-03-06 ENCOUNTER — Encounter: Admitting: Physical Therapy

## 2024-03-07 NOTE — Addendum Note (Signed)
 Addended by: GERIANNE RAISIN C on: 03/07/2024 12:56 PM   Modules accepted: Orders

## 2024-03-11 ENCOUNTER — Encounter (HOSPITAL_BASED_OUTPATIENT_CLINIC_OR_DEPARTMENT_OTHER)

## 2024-03-11 ENCOUNTER — Ambulatory Visit: Attending: Obstetrics and Gynecology | Admitting: Physical Therapy

## 2024-03-11 DIAGNOSIS — R293 Abnormal posture: Secondary | ICD-10-CM | POA: Insufficient documentation

## 2024-03-11 DIAGNOSIS — R279 Unspecified lack of coordination: Secondary | ICD-10-CM | POA: Insufficient documentation

## 2024-03-11 DIAGNOSIS — M6281 Muscle weakness (generalized): Secondary | ICD-10-CM | POA: Insufficient documentation

## 2024-03-11 DIAGNOSIS — G8929 Other chronic pain: Secondary | ICD-10-CM | POA: Diagnosis not present

## 2024-03-11 DIAGNOSIS — M25512 Pain in left shoulder: Secondary | ICD-10-CM | POA: Diagnosis not present

## 2024-03-11 NOTE — Therapy (Addendum)
 OUTPATIENT PHYSICAL THERAPY FEMALE PELVIC TREATMENT (10th Visit Progress note)   Patient Name: Vanessa Trujillo MRN: [id number] DOB:Sep 29, 1940, 83 y.o., female Today's Date: 03/11/2024  END OF SESSION:  PT End of Session - 03/11/24 1706     Visit Number 10    Number of Visits 10    Date for PT Re-Evaluation 03/03/24    Authorization Type [employer]    Authorization Time Period 12 visits approved  01/19/24-02/27/24    Authorization - Visit Number 10    Authorization - Number of Visits 12    Progress Note Due on Visit 16    PT Start Time 0200    PT Stop Time 0245    PT Time Calculation (min) 45 min    Activity Tolerance Patient limited by pain    Behavior During Therapy [facility] for tasks assessed/performed         Past Medical History:  Diagnosis Date   Allergic rhinitis    Aortic atherosclerosis (HCC)    Chronic UTI    DDD (degenerative disc disease), lumbar    Diverticulosis of colon    GERD (gastroesophageal reflux disease)    Hematuria    Hiatal hernia    large per 06/05/23 CT A/P in Epic   History of adenomatous polyp of colon    History of hepatitis    age 28, hospitalized   History of kidney stones    Hyperlipidemia    results in epic;   NUC 06-18-2016  LR w/ nuclear ef 82%;  echo 06-18-2026 normal w/ ef 60-65%;  CTC 08-21-2018  calcium score= 0.9, LR   Macular degeneration of both eyes    OAB (overactive bladder)    Follows w/ [facility].   Osteopenia    PONV (postoperative nausea and vomiting)    TMJ (sprain of temporomandibular joint)    around 2018   Urge incontinence    Uterine fibroid    Vitamin B12 deficiency    Vitamin D  deficiency    Past Surgical History:  Procedure Laterality Date   APPENDECTOMY  1959   CATARACT EXTRACTION W/ INTRAOCULAR LENS IMPLANT Right 09/08/2015   Dr. Camillo   CHOLECYSTECTOMY  1989   COLONOSCOPY WITH PROPOFOL   04/08/2012   dr donnald   CYSTOSCOPY N/A 07/16/2023   Procedure: CYSTOSCOPY with  hydrodistention and intravesical botox  injection (100 units);  Surgeon: Marilynne Rosaline SAILOR, MD;  Location: [facility];  Service: Gynecology;  Laterality: N/A;   DILATATION & CURRETTAGE/HYSTEROSCOPY WITH RESECTOCOPE  04/12/1999   @ [facility] by dr fredrik sharps;    polypectomy   LITHOTRIPSY     TONSILLECTOMY AND ADENOIDECTOMY  1946   TUBAL LIGATION Bilateral 1979   Patient Active Problem List   Diagnosis Date Noted   Medication monitoring encounter 08/09/2022   Overactive bladder 10/18/2021   Recurrent UTI 10/18/2021   Depression 06/17/2016   Gastroesophageal reflux disease without esophagitis    Chest pain at rest 06/16/2016   Cough 06/16/2016   Hyperglycemia 06/16/2016   Calculus of kidney 04/16/2013   Urge incontinence 04/16/2013   Urinary urgency 04/16/2013   Infection of urinary tract 04/16/2013    PCP: Dwight Trula SQUIBB, MD  REFERRING PROVIDER: Zuleta, Kaitlin G, NP   REFERRING DIAG: N81.10 (ICD-10-CM) - Prolapse of anterior vaginal wall N32.81 (ICD-10-CM) - Overactive bladder  THERAPY DIAG:  Unspecified lack of coordination  Muscle weakness (generalized)  Abnormal posture  Rationale for Evaluation and Treatment: Rehabilitation  ONSET DATE: last year  SUBJECTIVE:  SUBJECTIVE STATEMENT: Patient reports that she is doing well today. She reports that she is still feeling her prolapse pressure today. Still leaking urine consistently.   From eval: Patient reports to PFPT with overactive bladder/prolapse symptoms. At the end of last year, her lower back was in a lot of pain and she has been dealing with this since in other PT. She has had injections for her back pain but they do not last. She was scheduled for a sacral stimulator placement yesterday, but that has been cancelled until her  back pain is under more control. She deals with urinary incontinence on a regular basis - she has tried pelvic PT, tibial nerve stimulation, botox , and other forms of treatment for her incontinence and has seen no improvement.   PAIN:  Are you having pain? Yes NPRS scale: 7/10 Pain location: low back pain - no pelvic pain at rest  PRECAUTIONS: None  RED FLAGS: None   WEIGHT BEARING RESTRICTIONS: No  FALLS:  Has patient fallen in last 6 months? No  OCCUPATION: retired  ACTIVITY LEVEL : would like to get back to a regular routine after her back pain is resolved some - enjoys going to [employer] and working on machines. She enjoys walking her dog daily and she has had to slow down.   PLOF: Independent with basic ADLs  PATIENT GOALS: decrease urinary incontinence and address prolapse symptoms   PERTINENT HISTORY:  Is being currently considered to undergo sacral nerve stimulator insertion stage 1 once she learns more about back pain   BOWEL MOVEMENT: no issues with bowel movements  Pain with bowel movement: No Type of bowel movement:Type (Bristol Stool Scale) 4, Frequency within normal limits , Strain no, and Splinting no Fully empty rectum: Yes:   Leakage: No Pads: No Fiber supplement/laxative No  URINATION: Pain with urination: No Fully empty bladder: Nosometimes she feels like there is more that can come out  Stream: Strong Urgency: Yes - but it has changed over the years - urgency comes on more suddenly  Frequency: more than within normal limits - doesn't get up to void anymore - wears depends in the nighttime  Leakage: Walking to the bathroom, Coughing, Exercise, Lifting, and Bending forward Pads: Yes: wears depends at night and wakes up with them soaked - wears pads during the day, changes it 6-8 times per day  Has a hx of chronic UTIs that are managed by medications.  INTERCOURSE: not currently sexually active   PREGNANCY: Vaginal deliveries 1 Episiotomy Yes    PROLAPSE: Pressure  OBJECTIVE:  Note: Objective measures were completed at Evaluation unless otherwise noted.  PATIENT SURVEYS:  PFIQ-7: 42  COGNITION: Overall cognitive status: Within functional limits for tasks assessed     SENSATION: Light touch: Appears intact  LUMBAR SPECIAL TESTS:  Single leg stance test: Positive  FUNCTIONAL TESTS:  Squat: only able to reach 50% of squat depth, general lumbopelvic stiffness present, bilateral dynamic knee valgus with loading   GAIT: Assistive device utilized: none Comments: mild trendelenburg gait pattern with ambulation   POSTURE: increased thoracic kyphosis  LUMBARAROM/PROM:   A/PROM A/PROM  eval  Flexion 50% limited   Extension 50% limited  Right lateral flexion 25% limited  Left lateral flexion 25% limited  Right rotation 25% limited  Left rotation 25% limited   (Blank rows = not tested)  LOWER EXTREMITY ROM: within functional limits   LOWER EXTREMITY MMT: 4/5 bilateral knees and hips grossly   PALPATION:   General: no significant  tenderness to palpation of bilateral adductors or hip flexors   Pelvic Alignment: within normal limits   Abdominal: upper chest breathing, abdominal bracing at rest, decreased lower rib excursion with inhalation                 External Perineal Exam: internal vaginal exam deferred today due to time limitations, will assess at follow up                              Internal Pelvic Floor: weakness throughout superficial and deep pelvic floor musculature bilaterally. Lack of coordination present throughout with diaphragmatic breathing interventions.  Patient confirms identification and approves PT to assess internal pelvic floor and treatment Yes No emotional/communication barriers or cognitive limitation. Patient is motivated to learn. Patient understands and agrees with treatment goals and plan. PT explains patient will be examined in standing, sitting, and lying down to see how their  muscles and joints work. When they are ready, they will be asked to remove their underwear so PT can examine their perineum. The patient is also given the option of providing their own chaperone as one is not provided in our facility. The patient also has the right and is explained the right to defer or refuse any part of the evaluation or treatment including the internal exam. With the patient's consent, PT will use one gloved finger to gently assess the muscles of the pelvic floor, seeing how well it contracts and relaxes and if there is muscle symmetry. After, the patient will get dressed and PT and patient will discuss exam findings and plan of care. PT and patient discuss plan of care, schedule, attendance policy and HEP activities.   PELVIC MMT:     MMT eval  Vaginal 3/5, 10 quick flicks, 5 sec hold  Internal Anal Sphincter   External Anal Sphincter   Puborectalis   Diastasis Recti   (Blank rows = not tested)  TONE: Low bilaterally   PROLAPSE: None present in hooklying position   TODAY'S TREATMENT:                                                                                                                              DATE:   EVAL 01/10/24: Examination completed, findings reviewed, pt educated on POC, HEP, and self care: Pt motivated to participate in PT and agreeable to attempt recommendations.  Neuro re-ed: Toileting mechanics for optimal emptying of bladder/bowels  Blow as you go technique for relieving pelvic strain with daily tasks   02/21/24: Neuro re-ed/manual therapy/self care Toileting mechanics for optimal emptying of bladder/bowels  Blow as you go technique for relieving pelvic strain with daily tasks  Internal vaginal examination and assessment of pelvic floor musculature  - Supine Pelvic Floor Contraction  - 1 x daily - 7 x weekly - 2 sets - 10 reps - Quick Flick Pelvic Floor Contractions in Hooklying  -  1 x daily - 7 x weekly - 2 sets - 10 reps - Supine Butterfly  Groin Stretch  - 1 x daily - 7 x weekly - 2 sets - hold - Supine Lower Trunk Rotation  - 1 x daily - 7 x weekly - 2 sets - hold - Supine Single Knee to Chest Stretch  - 1 x daily - 7 x weekly - 2 sets - hold - Supine Figure 4 Piriformis Stretch  - 1 x daily - 7 x weekly - 2 sets - hold  Patient Education - Urinary Urge Control Techniques - Urinary Urge Control Techniques  03/11/24: Seated pelvic floor contraction + diaphragmatic breathing 2x10  Seated quick flick pelvic floor contraction + diaphragmatic breathing 2x10  Sit to stand + diaphragmatic breathing 2x10  Sidelying clam + reverse clam + diaphragmatic breathing 2x10 each  Bridge + adductor ball squeeze + diaphragmatic breathing 2x10   PATIENT EDUCATION:  Education details: relative anatomy and the connection between the diaphragm and pelvic floor, bladder irritants and water  intake recommendations  Person educated: Patient Education method: Explanation, Demonstration, Tactile cues, Verbal cues, and Handouts Education comprehension: verbalized understanding, returned demonstration, verbal cues required, tactile cues required, and needs further education  HOME EXERCISE PROGRAM: Access Code: [email or web address] URL: [email or web address] Date: 03/11/2024 Prepared by: Celena Domino  Exercises - Seated Pelvic Floor Contraction  - 1 x daily - 7 x weekly - 2 sets - 10 reps - Seated Quick Flick Pelvic Floor Contractions  - 1 x daily - 7 x weekly - 2 sets - 10 reps - Sit to Stand with Pelvic Floor Contraction  - 1 x daily - 7 x weekly - 2 sets - 10 reps - Supine Bridge with Mini Swiss Ball Between Knees  - 1 x daily - 7 x weekly - 2 sets - 10 reps - Clamshell  - 1 x daily - 7 x weekly - 2 sets - 10 reps - Sidelying Reverse Clamshell  - 1 x daily - 7 x weekly - 2 sets - 10 reps  ASSESSMENT:  CLINICAL IMPRESSION: Patient is a 83 y.o. female  who was seen today for physical therapy treatment for vaginal prolapse  and urinary incontinence. She reports minimal progress in prolapse/urinary symptom management. She has been consistent with HEP, so we progressed all pelvic training to the seated position today for increased gravitational loading. She tolerated this very well and could feel a change in her pelvic pressure when doing the exercise correctly vs incorrectly. She reports no pain at end of session, no worsening prolapse symptoms or pelvic pressure. Pt would benefit from additional PT to further address deficits.    OBJECTIVE IMPAIRMENTS: decreased coordination, decreased endurance, decreased mobility, decreased ROM, and decreased strength.   ACTIVITY LIMITATIONS: continence  PARTICIPATION LIMITATIONS: community activity  PERSONAL FACTORS: Age, Past/current experiences, and Time since onset of injury/illness/exacerbation are also affecting patient's functional outcome.   REHAB POTENTIAL: Good  CLINICAL DECISION MAKING: Stable/uncomplicated  EVALUATION COMPLEXITY: Low   GOALS: Goals reviewed with patient? Yes  SHORT TERM GOALS: Target date: 02/07/2024  Pt will be independent with HEP.  Baseline: Goal status: GOAL MET   2.  Pt will be independent with diaphragmatic breathing and down training activities in order to improve pelvic floor relaxation. Baseline:  Goal status: GOAL MET   3.  Pt will be independent with the knack, urge suppression technique, and double voiding in order to improve bladder habits and decrease urinary incontinence.  Baseline:  Goal status: GOAL MET   4.  Pt will have 50% reduced leakage during a typical day  Baseline:  Goal status: GOAL MET   LONG TERM GOALS: Target date: 07/12/2024  Pt will be independent with advanced HEP.  Baseline:  Goal status: ONGOING  2.  Pt to demonstrate improved coordination of pelvic floor and breathing mechanics with 10# squat with appropriate synergistic patterns to decrease pain and leakage at least 75% of the time for improved  ability to complete a 30 minute workout with strain at pelvic floor and symptoms.   Baseline:  Goal status: ONGOING  3.  Pt will have 75% reduced leakage during a typical day to improve quality of life and decrease pad usage throughout the day.  Baseline:  Goal status: ONGOING  4.  Pt to demonstrate at least 4+/5 bil hip strength for improved pelvic stability and functional squats without leakage.  Baseline:  Goal status: ONGOING  5.  Pt will be able to correctly perform diaphragmatic breathing and appropriate pressure management in order to prevent worsening vaginal wall laxity and improve pelvic floor A/ROM.   Baseline:  Goal status: ONGOING  PLAN:  PT FREQUENCY: 1-2x/week  PT DURATION: 12 weeks  PLANNED INTERVENTIONS: 97110-Therapeutic exercises, 97530- Therapeutic activity, 97112- Neuromuscular re-education, 97535- Self Care, [id number]- Manual therapy, Patient/Family education, Taping, Joint mobilization, Spinal mobilization, Scar mobilization, Cryotherapy, and Moist heat  PLAN FOR NEXT SESSION: internal vaginal examination of pelvic floor musculature   Celena JAYSON Domino, PT 03/11/2024, 5:08 PM

## 2024-03-13 ENCOUNTER — Encounter (HOSPITAL_BASED_OUTPATIENT_CLINIC_OR_DEPARTMENT_OTHER): Payer: Self-pay | Admitting: Physical Therapy

## 2024-03-13 ENCOUNTER — Encounter: Admitting: Physical Therapy

## 2024-03-13 ENCOUNTER — Ambulatory Visit (HOSPITAL_BASED_OUTPATIENT_CLINIC_OR_DEPARTMENT_OTHER): Attending: Orthopaedic Surgery | Admitting: Physical Therapy

## 2024-03-13 DIAGNOSIS — M25512 Pain in left shoulder: Secondary | ICD-10-CM | POA: Insufficient documentation

## 2024-03-13 DIAGNOSIS — R293 Abnormal posture: Secondary | ICD-10-CM | POA: Diagnosis not present

## 2024-03-13 DIAGNOSIS — M6281 Muscle weakness (generalized): Secondary | ICD-10-CM | POA: Diagnosis not present

## 2024-03-13 DIAGNOSIS — G8929 Other chronic pain: Secondary | ICD-10-CM | POA: Insufficient documentation

## 2024-03-13 NOTE — Therapy (Signed)
 OUTPATIENT PHYSICAL THERAPY DISCHARGE         Patient Name: LAISA LARRICK MRN: [id number] DOB:10/25/1940, 83 y.o., female Today's Date: 03/13/2024  END OF SESSION:  PT End of Session - 03/18/24 1316     Visit Number 11    Authorization Type [employer]    Authorization Time Period 9/4 needs new auth    PT Start Time 1347    PT Stop Time 1430    PT Time Calculation (min) 43 min    Activity Tolerance Patient tolerated treatment well    Behavior During Therapy WFL for tasks assessed/performed             Past Medical History:  Diagnosis Date   Allergic rhinitis    Aortic atherosclerosis (HCC)    Chronic UTI    DDD (degenerative disc disease), lumbar    Diverticulosis of colon    GERD (gastroesophageal reflux disease)    Hematuria    Hiatal hernia    large per 06/05/23 CT A/P in Epic   History of adenomatous polyp of colon    History of hepatitis    age 35, hospitalized   History of kidney stones    Hyperlipidemia    results in epic;   NUC 06-18-2016  LR w/ nuclear ef 82%;  echo 06-18-2026 normal w/ ef 60-65%;  CTC 08-21-2018  calcium score= 0.9, LR   Macular degeneration of both eyes    OAB (overactive bladder)    Follows w/ [facility].   Osteopenia    PONV (postoperative nausea and vomiting)    TMJ (sprain of temporomandibular joint)    around 2018   Urge incontinence    Uterine fibroid    Vitamin B12 deficiency    Vitamin D  deficiency    Past Surgical History:  Procedure Laterality Date   APPENDECTOMY  1959   CATARACT EXTRACTION W/ INTRAOCULAR LENS IMPLANT Right 09/08/2015   Dr. Camillo   CHOLECYSTECTOMY  1989   COLONOSCOPY WITH PROPOFOL   04/08/2012   dr donnald   CYSTOSCOPY N/A 07/16/2023   Procedure: CYSTOSCOPY with hydrodistention and intravesical botox  injection (100 units);  Surgeon: Marilynne Rosaline SAILOR, MD;  Location: [facility];  Service: Gynecology;  Laterality: N/A;   DILATATION &  CURRETTAGE/HYSTEROSCOPY WITH RESECTOCOPE  04/12/1999   @ [facility] by dr fredrik sharps;    polypectomy   LITHOTRIPSY     TONSILLECTOMY AND ADENOIDECTOMY  1946   TUBAL LIGATION Bilateral 1979   Patient Active Problem List   Diagnosis Date Noted   Medication monitoring encounter 08/09/2022   Overactive bladder 10/18/2021   Recurrent UTI 10/18/2021   Depression 06/17/2016   Gastroesophageal reflux disease without esophagitis    Chest pain at rest 06/16/2016   Cough 06/16/2016   Hyperglycemia 06/16/2016   Calculus of kidney 04/16/2013   Urge incontinence 04/16/2013   Urinary urgency 04/16/2013   Infection of urinary tract 04/16/2013    REFERRING PROVIDER: Genelle Standing, MD   REFERRING DIAG:  [phone] (ICD-10-CM) - Chronic left SI joint pain   Rationale for Evaluation and Treatment: Rehabilitation  THERAPY DIAG:  Muscle weakness (generalized)  Abnormal posture  ONSET DATE: Dec 2024/Jan 2025   SUBJECTIVE:  SUBJECTIVE STATEMENT:  What still bothers me is the shoulder blade. Manual techniques do give some relief but do not last. Today is good but yesterday was not good no manner what I did. 2 pm lyrica  and 1 AM with 2 aleve.   Re-evaluation performed on 01/19/2024 for dx of SIJD.  Very stiff across the back. AM stiffness- not as bad as it was before the injection but still bad. What worries me the most is the right leg- like it won't support me. I can tell I have lost a lot of strength. I do my exercises every morning to decrease stiffness.  The pain in my left shoulder region is really back. Upper back tends to cramp as I am doing things.   PERTINENT HISTORY:  Chronic back pain  PAIN:  Are you having pain? Yes: NPRS scale: severe Pain location: across low back Pain description: tight Aggravating  factors: AM Relieving factors: movement  PRECAUTIONS:  None  RED FLAGS: None   WEIGHT BEARING RESTRICTIONS:  No  FALLS:  Has patient fallen in last 6 months? No   PLOF:  Independent  PATIENT GOALS:  Decrease pain   OBJECTIVE:  Note: Objective measures were completed at Evaluation unless otherwise noted.  DIAGNOSTIC FINDINGS:  MRI 6/10 IMPRESSION: Mild retrolisthesis of L3 on L4 and moderate degenerative disc disease with mild endplate marrow edema.   Multilevel degenerative spondylosis with levels described in detail above.   No disc protrusion or evidence of impingement   PATIENT SURVEYS:  Modified Oswestry:  MODIFIED OSWESTRY DISABILITY SCALE   Date: 7/12 Score 02/11/24  Pain intensity 4 =  Pain medication provides me with little relief from pain. 3  2. Personal care (washing, dressing, etc.) 2 =  It is painful to take care of myself, and I am slow and careful. 1  3. Lifting 4 = I can lift only very light weights 2  4. Walking 3 =  Pain prevents me from walking more than  mile. 1  5. Sitting 3 =  Pain prevents me from sitting more than  hour. 0  6. Standing 4 =  Pain prevents me from standing more than 10 minutes. 4  7. Sleeping 2 =  Even when I take pain medication, I sleep less than 6 hours 1  8. Social Life 3 =  Pain prevents me from going out very often. 2  9. Traveling 4 = My pain restricts my travel to short necessary journeys under 1/2 hour. 2  10. Employment/ Homemaking 3 = Pain prevents me from doing anything but light duties. 2  Total 32/50 34%   Interpretation of scores: Score Category Description  0-20% Minimal Disability The patient can cope with most living activities. Usually no treatment is indicated apart from advice on lifting, sitting and exercise  21-40% Moderate Disability The patient experiences more pain and difficulty with sitting, lifting and standing. Travel and social life are more difficult and they may be disabled from work.  Personal care, sexual activity and sleeping are not grossly affected, and the patient can usually be managed by conservative means  41-60% Severe Disability Pain remains the main problem in this group, but activities of daily living are affected. These patients require a detailed investigation  61-80% Crippled Back pain impinges on all aspects of the patient's life. Positive intervention is required  81-100% Bed-bound  These patients are either bed-bound or exaggerating their symptoms  Bluford BRAVO, Zoe DELENA Karon DELENA, et al. Surgery versus conservative management  of stable thoracolumbar fracture: the PRESTO feasibility RCT. [place]): [employer]; 2021 Nov. [facility] Technology Assessment, No. 25.62.) Appendix 3, Oswestry Disability Index category descriptors. Available from: FindJewel[employer]cz  Minimally Clinically Important Difference (MCID) = 12.8%  COGNITIVE STATUS: Within functional limits for tasks assessed   SENSATION: WFL   POSTURE:  Eval- seated: shifted to the left with left shoulder elevation, bilateral rounded shoulders. Standing: Rt ASIS elevation, left scapular elevation, incr thoracic kyphosis, Rt scapular winging  GAIT: 01/19/24 Comments: Rt trendelenburg, slow cadence, lacking trunk rotation   Body Part #1 Hip   LOWER EXTREMITY MMT:     MMT (lb) Right eval Left eval Rt/Lt 4/9 Rt/Lt 5/5 Rt/lt 8/4  Hip flexion           Hip extension           Hip abduction (SL at knee) 15.9 6.4 12.5/11.1 14.9/21.2 25.4/27.0  Hip adduction           Hip internal rotation           Hip external rotation           Knee flexion 12.0 10.2 16.4/12.2 23.9/20.5 29.5/23.3  Knee extension 26.3 11.0 32.1/12.7 29.2/21.4 31.1/28.2   (Blank rows = not tested)   MMT (lb) Right 9/4  Left 9/4  Shoulder flexion 9.4 7.1  Shoulder extension 11.2 8.4 p!  Shoulder abduction    Shoulder adduction    Shoulder internal rotation 9.0 7.5  Shoulder  external rotation 9.6 6.5  Elbow flexion 11.8 14.8  Elbow ext 11.5 10.2  grip 15 15  (Blank rows = not tested)                                                                                                                            TREATMENT DATE:   9/4 Edu on use of theracane    8/25 STM to periscapular mm STM to thoracic paraspinals in seated position Static cupping to periscapular mm  Posterior shoulder rolls 2x10 Seated thoracic rotation with deep breathing 5 x5ea OH reach with deep breathing x3ea Scapular depression isometric- seated on plinth pushing hand into yoga block 5 x10 Seated W x10 Seated rhomboid stretch Thoracic extension seated in chair 5 x5   8/18 STM to periscapular mm STM to thoracic paraspinals in prone Quadruped cat stretch 5 x5 (bothered wrist and arms) Posterior shoulder rolls x10 Seated thoracic rotation with deep breathing 5 x5ea OH reach with deep breathing x3ea Scapular depression isometric- seated on plinth pushing hand into yoga block 5 x5ea Seated W x10 Seated rhomboid stretch Thoracic extension seated in chair 5 x5 Pec stretch at wall 2x15sec L only     02/11/24 STM to periscapular mm Sidelying scapulothoracic mobilizations Row RTB 2x15 (tactile cues) Protraction in s/l 2x10 with cuing Supine protraction 2x10 Updated MMT Updated goals MODI   02/05/24: Discussed anatomy of condition & nervous pain vs MSK pain & input of lyrica /other meds Sidelying STM  along scapular medial border, deep breathing + scap distraciton, scap mobilizations Sidelying scap protraction/retraction Supine protraction reach at 90 deg flexion- required some manual work in this position as pt c/o referred pain into lower thoracic region Wall push ups +    PATIENT EDUCATION:  Education details: Teacher, music of condition, POC, HEP, exercise form/rationale Person educated: Patient Education method: Programmer, multimedia, Demonstration, Tactile cues, Verbal  cues, and Handouts Education comprehension: verbalized understanding, returned demonstration, verbal cues required, tactile cues required, and needs further education  HOME EXERCISE PROGRAM: Wear lift, Rt rib cage expansion/Lt anchor  [id number]   ASSESSMENT:  CLINICAL IMPRESSION:  Time taken today to educate patient on anatomy of condition and plan to d/c SIJD dx and re-evaluate shoulder after obtaining referral from MD. Discussed importance of continued exercise, postural awareness and endurance.   Re-eval: Patient is a 83 y.o. F who was seen today for physical therapy re-evaluation and treatment for chronic LBP and Lt shoulder pain. Lt shoudler pain is consistent with spasm around scapula from elevated posture. Added heel lift to left shoe and began correcting lumbar shift. Pt was able to improve trunk rotation in gait today with tactile cues and will continue to work on this.     OBJECTIVE IMPAIRMENTS: Abnormal gait, decreased activity tolerance, decreased balance, decreased endurance, difficulty walking, decreased ROM, decreased strength, increased muscle spasms, improper body mechanics, and pain.   ACTIVITY LIMITATIONS: carrying, lifting, bending, sitting, standing, squatting, sleeping, stairs, transfers, bed mobility, dressing, reach over head, and locomotion level  PARTICIPATION LIMITATIONS: meal prep, cleaning, laundry, driving, shopping, community activity, and yard work  PERSONAL FACTORS: Fitness and Time since onset of injury/illness/exacerbation are also affecting patient's functional outcome.   REHAB POTENTIAL: Good  CLINICAL DECISION MAKING: Evolving/moderate complexity  EVALUATION COMPLEXITY: Moderate   GOALS: Goals reviewed with patient? Yes  SHORT TERM GOALS: Target date: 8/2  Able to demo stacked posture without shift Baseline: Goal status: MET 7/29   LONG TERM GOALS: Target date: POC date  Ambulation with trunk rotation and resolution of  trendelenburg Baseline:  Goal status: IN PROGRESS 02/11/24  2.  Mod oswestry to improve by MDC Baseline:  Goal status: NOT MET 8/4  3.  Hip abd strength 85% right to left Baseline:  Goal status: MET (94.1% on 8/4)  4.  Pt will verbalize improvement in ability to get out of bed in the morning with reduced stiffness Baseline:  Goal status: IN PROGRESS (Improving, not quite full improvement)8/4  5.  Return to walking program Baseline:  Goal status: IN PROGRESS (walked a mile with rest breaks 8/4)    PLAN:  PT FREQUENCY: 1-2x/week  PT DURATION: POC Date  PLANNED INTERVENTIONS: 97164- PT Re-evaluation, 97750- Physical Performance Testing, 97110-Therapeutic exercises, 97530- Therapeutic activity, [id number]- Neuromuscular re-education, 97535- Self Care, [id number]- Manual therapy, [id number]- Gait training, [phone]- Aquatic Therapy, [phone] (1-2 muscles), [phone] (3+ muscles)- Dry Needling, Patient/Family education, Balance training, Stair training, Taping, Joint mobilization, Spinal mobilization, and Cryotherapy.    Harlene Cordon, PT 03/13/2024, 1:52 PM  [employer] Medicare Authorization form:  Date 7/12  What was this (referring dx) caused by? Unspecified  Nature of Condition: Chronic (continuous duration > 3 months)  Current Functional Measure Score: See objective section of note  Objective measurements identify impairments when they are compared to normal values, the uninvolved extremity, and prior level of function.  [x]  Yes  []  No  Objective assessment of functional ability: Moderate functional limitations   Briefly describe symptoms: see subjective  How did symptoms start:  insidious  Average pain intensity:  Last 24 hours: 8/10  Past week: 8/10  How often does the pt experience symptoms? Frequently  How much have the symptoms interfered with usual daily activities? Quite a bit  How is the condition changing,since beginning care at this facility? 5 A little better (7/12 is re-eval  dae)  In general, how is the patients overall health? Good   BACK PAIN (STarT Back Screening Tool) Has pain spread down the leg(s) at some time in the last 2 weeks? no Has there been pain in the shoulder or neck at some time in the last 2 weeks? yes Has the pt only walked short distances because of back pain? yes Has patient dressed more slowly because of back pain in the past 2 weeks? yes Does patient think it's not safe for a person with this condition to be physically active? yes Does patient have worrying thoughts a lot of the time? no Does patient feel back pain is terrible and will never get any better? no Has patient stopped enjoying things they usually enjoy? yes Overall, how bothersome has back pain been in the last 2 weeks?                    Very Much  Progress note to be completed by PT next visit.  Samuell Knoble C. Delynn Olvera PT, DPT 03/13/24 1:52 PM

## 2024-03-14 ENCOUNTER — Other Ambulatory Visit (HOSPITAL_BASED_OUTPATIENT_CLINIC_OR_DEPARTMENT_OTHER): Payer: Self-pay | Admitting: Orthopaedic Surgery

## 2024-03-14 DIAGNOSIS — G8929 Other chronic pain: Secondary | ICD-10-CM

## 2024-03-17 ENCOUNTER — Encounter (HOSPITAL_BASED_OUTPATIENT_CLINIC_OR_DEPARTMENT_OTHER): Admitting: Physical Therapy

## 2024-03-18 ENCOUNTER — Ambulatory Visit: Admitting: Physical Therapy

## 2024-03-18 DIAGNOSIS — R279 Unspecified lack of coordination: Secondary | ICD-10-CM | POA: Diagnosis not present

## 2024-03-18 DIAGNOSIS — R293 Abnormal posture: Secondary | ICD-10-CM

## 2024-03-18 DIAGNOSIS — M25512 Pain in left shoulder: Secondary | ICD-10-CM | POA: Diagnosis not present

## 2024-03-18 DIAGNOSIS — G8929 Other chronic pain: Secondary | ICD-10-CM | POA: Diagnosis not present

## 2024-03-18 DIAGNOSIS — M6281 Muscle weakness (generalized): Secondary | ICD-10-CM

## 2024-03-18 NOTE — Therapy (Signed)
 OUTPATIENT PHYSICAL THERAPY FEMALE PELVIC PROGRESS NOTE   Patient Name: Vanessa Trujillo MRN: [id number] DOB:[birth year], 83 y.o., female Today's Date: 03/18/2024  END OF SESSION:  PT End of Session - 03/18/24 1150     Visit Number 12    Authorization Type [employer]    Authorization Time Period 9/4 needs new auth    PT Start Time 1100    PT Stop Time 1145    PT Time Calculation (min) 45 min    Activity Tolerance Patient tolerated treatment well    Behavior During Therapy WFL for tasks assessed/performed          Past Medical History:  Diagnosis Date   Allergic rhinitis    Aortic atherosclerosis (HCC)    Chronic UTI    DDD (degenerative disc disease), lumbar    Diverticulosis of colon    GERD (gastroesophageal reflux disease)    Hematuria    Hiatal hernia    large per 06/05/23 CT A/P in Epic   History of adenomatous polyp of colon    History of hepatitis    age 25, hospitalized   History of kidney stones    Hyperlipidemia    results in epic;   NUC 06-18-2016  LR w/ nuclear ef 82%;  echo 06-18-2026 normal w/ ef 60-65%;  CTC 08-21-2018  calcium score= 0.9, LR   Macular degeneration of both eyes    OAB (overactive bladder)    Follows w/ [facility].   Osteopenia    PONV (postoperative nausea and vomiting)    TMJ (sprain of temporomandibular joint)    around 2018   Urge incontinence    Uterine fibroid    Vitamin B12 deficiency    Vitamin D  deficiency    Past Surgical History:  Procedure Laterality Date   APPENDECTOMY  1959   CATARACT EXTRACTION W/ INTRAOCULAR LENS IMPLANT Right 09/08/2015   Dr. Camillo   CHOLECYSTECTOMY  1989   COLONOSCOPY WITH PROPOFOL   04/08/2012   dr donnald   CYSTOSCOPY N/A 07/16/2023   Procedure: CYSTOSCOPY with hydrodistention and intravesical botox  injection (100 units);  Surgeon: Marilynne Rosaline SAILOR, MD;  Location: [facility];  Service: Gynecology;  Laterality: N/A;   DILATATION &  CURRETTAGE/HYSTEROSCOPY WITH RESECTOCOPE  04/12/1999   @ [facility] by dr fredrik sharps;    polypectomy   LITHOTRIPSY     TONSILLECTOMY AND ADENOIDECTOMY  1946   TUBAL LIGATION Bilateral 1979   Patient Active Problem List   Diagnosis Date Noted   Medication monitoring encounter 08/09/2022   Overactive bladder 10/18/2021   Recurrent UTI 10/18/2021   Depression 06/17/2016   Gastroesophageal reflux disease without esophagitis    Chest pain at rest 06/16/2016   Cough 06/16/2016   Hyperglycemia 06/16/2016   Calculus of kidney 04/16/2013   Urge incontinence 04/16/2013   Urinary urgency 04/16/2013   Infection of urinary tract 04/16/2013    PCP: Dwight Trula SQUIBB, MD  REFERRING PROVIDER: Zuleta, Kaitlin G, NP   REFERRING DIAG: N81.10 (ICD-10-CM) - Prolapse of anterior vaginal wall N32.81 (ICD-10-CM) - Overactive bladder  THERAPY DIAG:  Muscle weakness (generalized)  Abnormal posture  Unspecified lack of coordination  Rationale for Evaluation and Treatment: Rehabilitation  ONSET DATE: last year  SUBJECTIVE:  SUBJECTIVE STATEMENT: Patient reports that she has had a bad week with her back this week, been taking more of the Lyrica  to try to help. Her pain is a little better today, 0/10 at rest. Her shoulder pain is present this morning. Prolapse pressure has not been present and she feels fine with this. Urinary leakage has been no different, she still leaks consistently throughout the day and night.   From eval: Patient reports to PFPT with overactive bladder/prolapse symptoms. At the end of last year, her lower back was in a lot of pain and she has been dealing with this since in other PT. She has had injections for her back pain but they do not last. She was scheduled for a sacral stimulator placement yesterday, but  that has been cancelled until her back pain is under more control. She deals with urinary incontinence on a regular basis - she has tried pelvic PT, tibial nerve stimulation, botox , and other forms of treatment for her incontinence and has seen no improvement.   PAIN:  03/18/24: 0/10 back pain due to taking pain medication   Are you having pain? Yes NPRS scale: 7/10 Pain location: low back pain - no pelvic pain at rest  PRECAUTIONS: None  RED FLAGS: None   WEIGHT BEARING RESTRICTIONS: No  FALLS:  Has patient fallen in last 6 months? No  OCCUPATION: retired  ACTIVITY LEVEL : would like to get back to a regular routine after her back pain is resolved some - enjoys going to [employer] and working on machines. She enjoys walking her dog daily and she has had to slow down.   PLOF: Independent with basic ADLs  PATIENT GOALS: decrease urinary incontinence and address prolapse symptoms   PERTINENT HISTORY:  Is being currently considered to undergo sacral nerve stimulator insertion stage 1 once she learns more about back pain   BOWEL MOVEMENT: no issues with bowel movements  Pain with bowel movement: No Type of bowel movement:Type (Bristol Stool Scale) 4, Frequency within normal limits , Strain no, and Splinting no Fully empty rectum: Yes:   Leakage: No Pads: No Fiber supplement/laxative No  URINATION: Pain with urination: No Fully empty bladder: Nosometimes she feels like there is more that can come out  Stream: Strong Urgency: Yes - but it has changed over the years - urgency comes on more suddenly  Frequency: more than within normal limits - doesn't get up to void anymore - wears depends in the nighttime  Leakage: Walking to the bathroom, Coughing, Exercise, Lifting, and Bending forward Pads: Yes: wears depends at night and wakes up with them soaked - wears pads during the day, changes it 6-8 times per day  Has a hx of chronic UTIs that are managed by medications.  INTERCOURSE:  not currently sexually active   PREGNANCY: Vaginal deliveries 1 Episiotomy Yes   PROLAPSE: Pressure  OBJECTIVE:  Note: Objective measures were completed at Evaluation unless otherwise noted.  PATIENT SURVEYS:  PFIQ-7: 42 PFIQ-7 reevaluation day 03/18/24: 43  COGNITION: Overall cognitive status: Within functional limits for tasks assessed     SENSATION: Light touch: Appears intact  LUMBAR SPECIAL TESTS:  Single leg stance test: Positive  FUNCTIONAL TESTS:  Squat: only able to reach 50% of squat depth, general lumbopelvic stiffness present, bilateral dynamic knee valgus with loading   GAIT: Assistive device utilized: none Comments: mild trendelenburg gait pattern with ambulation   POSTURE: increased thoracic kyphosis  LUMBARAROM/PROM:   Flexion 50% limited    Extension  50% limited   Right lateral flexion 25% limited   Left lateral flexion 25% limited   Right rotation 25% limited   Left rotation 25% limited    (Blank rows = not tested)  LOWER EXTREMITY ROM: within functional limits   LOWER EXTREMITY MMT: 4/5 bilateral knees and hips grossly   PALPATION:   General: no significant tenderness to palpation of bilateral adductors or hip flexors   Pelvic Alignment: within normal limits   Abdominal: upper chest breathing, abdominal bracing at rest, decreased lower rib excursion with inhalation                 External Perineal Exam: internal vaginal exam deferred today due to time limitations, will assess at follow up                              Internal Pelvic Floor: weakness throughout superficial and deep pelvic floor musculature bilaterally. Lack of coordination present throughout with diaphragmatic breathing interventions.  Patient confirms identification and approves PT to assess internal pelvic floor and treatment Yes No emotional/communication barriers or cognitive limitation. Patient is motivated to learn. Patient understands and agrees with treatment goals  and plan. PT explains patient will be examined in standing, sitting, and lying down to see how their muscles and joints work. When they are ready, they will be asked to remove their underwear so PT can examine their perineum. The patient is also given the option of providing their own chaperone as one is not provided in our facility. The patient also has the right and is explained the right to defer or refuse any part of the evaluation or treatment including the internal exam. With the patient's consent, PT will use one gloved finger to gently assess the muscles of the pelvic floor, seeing how well it contracts and relaxes and if there is muscle symmetry. After, the patient will get dressed and PT and patient will discuss exam findings and plan of care. PT and patient discuss plan of care, schedule, attendance policy and HEP activities.   PELVIC MMT:     MMT eval Re-eval 03/18/24  Vaginal 3/5, 10 quick flicks, 5 sec hold 3+/5, 10 quick flicks, 10 sec hold   Internal Anal Sphincter    External Anal Sphincter    Puborectalis    Diastasis Recti    (Blank rows = not tested)  TONE: Low bilaterally   PROLAPSE: None present in hooklying position   TODAY'S TREATMENT:                                                                                                                              DATE:   EVAL 01/10/24: Examination completed, findings reviewed, pt educated on POC, HEP, and self care: Pt motivated to participate in PT and agreeable to attempt recommendations.  Neuro re-ed: Toileting mechanics for optimal emptying of bladder/bowels  Blow as you go  technique for relieving pelvic strain with daily tasks   02/21/24: Neuro re-ed/manual therapy/self care Toileting mechanics for optimal emptying of bladder/bowels  Blow as you go technique for relieving pelvic strain with daily tasks  Internal vaginal examination and assessment of pelvic floor musculature  - Supine Pelvic Floor Contraction  - 1 x  daily - 7 x weekly - 2 sets - 10 reps - Quick Flick Pelvic Floor Contractions in Hooklying  - 1 x daily - 7 x weekly - 2 sets - 10 reps - Supine Butterfly Groin Stretch  - 1 x daily - 7 x weekly - 2 sets - hold - Supine Lower Trunk Rotation  - 1 x daily - 7 x weekly - 2 sets - hold - Supine Single Knee to Chest Stretch  - 1 x daily - 7 x weekly - 2 sets - hold - Supine Figure 4 Piriformis Stretch  - 1 x daily - 7 x weekly - 2 sets - hold  Patient Education - Urinary Urge Control Techniques - Urinary Urge Control Techniques  03/11/24: Seated pelvic floor contraction + diaphragmatic breathing 2x10  Seated quick flick pelvic floor contraction + diaphragmatic breathing 2x10  Sit to stand + diaphragmatic breathing 2x10  Sidelying clam + reverse clam + diaphragmatic breathing 2x10 each  Bridge + adductor ball squeeze + diaphragmatic breathing 2x10   03/18/24: Internal pelvic floor reassessment: patient demonstrates improved coordination and strength in the pelvic floor since starting pelvic PT. She will occasionally bear down when trying to contract the pelvic floor, but with repetitive internal cueing for shortening the musculature with exhalation, she quickly corrects this and continues the exercise correctly.  Urge drill for managing urge urinary incontinence  Knack drill for managing stress urinary incontinence   PATIENT EDUCATION:  Education details: relative anatomy and the connection between the diaphragm and pelvic floor, bladder irritants and water  intake recommendations  Person educated: Patient Education method: Explanation, Demonstration, Tactile cues, Verbal cues, and Handouts Education comprehension: verbalized understanding, returned demonstration, verbal cues required, tactile cues required, and needs further education  HOME EXERCISE PROGRAM: Access Code: [email or web address] URL: [email or web address] Date: 03/18/2024 Prepared by: Celena Domino  Exercises - Supine Pelvic Floor Contraction  - 1 x daily - 7 x weekly - 2 sets - 10 reps - Seated Quick Flick Pelvic Floor Contractions  - 1 x daily - 7 x weekly - 2 sets - 10 reps - Sit to Stand with Pelvic Floor Contraction  - 1 x daily - 7 x weekly - 2 sets - 10 reps - Supine Bridge with Mini Swiss Ball Between Knees  - 1 x daily - 7 x weekly - 2 sets - 10 reps - Clamshell  - 1 x daily - 7 x weekly - 2 sets - 10 reps - Sidelying Reverse Clamshell  - 1 x daily - 7 x weekly - 2 sets - 10 reps  ASSESSMENT:  CLINICAL IMPRESSION: Patient is a 83 y.o. female  who was seen today for physical therapy re-evaluation for vaginal prolapse and urinary incontinence. She reports minimal progress in urinary symptom management, but no prolapse related concerns to report. Back pain is currently 0/10 due to taking pain medication. She fully consents to today's internal examination. She demonstrates improved strength in the pelvic floor in supine, but she will tend to bear down against her pelvic floor rather than lifting the musculature when she contracts. This lack of coordination appears to be her largest setback with  completing pelvic floor exercises correctly independently. We reviewed this extensively today with internal cueing for proper pelvic floor contractions, focusing on muscular range of motion and endurance. Review of knack and urge drill for management of urinary symptoms went well and pt reports full understanding of these techniques. Pt tolerated session well and would continue to benefit from skilled pelvic PT intervention 1x/wk for 6 visits to address deficits.   OBJECTIVE IMPAIRMENTS: decreased coordination, decreased endurance, decreased mobility, decreased ROM, and decreased strength.   ACTIVITY LIMITATIONS: continence  PARTICIPATION LIMITATIONS: community activity  PERSONAL FACTORS: Age, Past/current experiences, and Time since onset of injury/illness/exacerbation are also affecting  patient's functional outcome.   REHAB POTENTIAL: Good  CLINICAL DECISION MAKING: Stable/uncomplicated  EVALUATION COMPLEXITY: Low   GOALS: Goals reviewed with patient? Yes  SHORT TERM GOALS: Target date: 02/07/2024  Pt will be independent with HEP.  Baseline: Goal status: GOAL MET   2.  Pt will be independent with diaphragmatic breathing and down training activities in order to improve pelvic floor relaxation. Baseline:  Goal status: GOAL MET   3.  Pt will be independent with the knack, urge suppression technique, and double voiding in order to improve bladder habits and decrease urinary incontinence.   Baseline:  Goal status: GOAL MET   4.  Pt will have 50% reduced leakage during a typical day  Baseline:  Goal status: ONGOING  LONG TERM GOALS: Target date: 07/12/2024  Pt will be independent with advanced HEP.  Baseline:  Goal status: ONGOING  2.  Pt to demonstrate improved coordination of pelvic floor and breathing mechanics with 10# squat with appropriate synergistic patterns to decrease pain and leakage at least 75% of the time for improved ability to complete a 30 minute workout with strain at pelvic floor and symptoms.   Baseline:  Goal status: ONGOING  3.  Pt will have 75% reduced leakage during a typical day to improve quality of life and decrease pad usage throughout the day.  Baseline:  Goal status: ONGOING  4.  Pt to demonstrate at least 4+/5 bil hip strength for improved pelvic stability and functional squats without leakage.  Baseline:  Goal status: ONGOING  5.  Pt will be able to correctly perform diaphragmatic breathing and appropriate pressure management in order to prevent worsening vaginal wall laxity and improve pelvic floor A/ROM.   Baseline:  Goal status: ONGOING  PLAN:  PT FREQUENCY: 1-2x/week  PT DURATION: 6 weeks  PLANNED INTERVENTIONS: 97110-Therapeutic exercises, 97530- Therapeutic activity, 97112- Neuromuscular re-education, 97535- Self  Care, [id number]- Manual therapy, Patient/Family education, Taping, Joint mobilization, Spinal mobilization, Scar mobilization, Cryotherapy, and Moist heat  PLAN FOR NEXT SESSION: progress pelvic floor training to seated position if she can tolerate this, work on standing training for pelvic floor with sheet around waist, review knack and urge drill, continue hip and core strengthening   Celena JAYSON Domino, PT 03/18/2024, 11:51 AM

## 2024-03-18 NOTE — Patient Instructions (Signed)
 Urge Incontinence  Ideal urination frequency is every 2-4 wakeful hours, which equates to 5-8 times within a 24-hour period.   Urge incontinence is leakage that occurs when the bladder muscle contracts, creating a sudden need to go before getting to the bathroom.   Going too often when your bladder isn't actually full can disrupt the body's automatic signals to store and hold urine longer, which will increase urgency/frequency.  In this case, the bladder "is running the show" and strategies can be learned to retrain this pattern.   One should be able to control the first urge to urinate, at around .  The bladder can hold up to a "grande latte," or . To help you gain control, practice the Urge Drill below when urgency strikes.  This drill will help retrain your bladder signals and allow you to store and hold urine longer.  The overall goal is to stretch out your time between voids to reach a more manageable voiding schedule.    Practice your quick flicks often throughout the day (each waking hour) even when you don't need feel the urge to go.  This will help strengthen your pelvic floor muscles, making them more effective in controlling leakage.  URGE DRILL When you feel an urge to go, follow these steps to regain control: Stop what you are doing and be still Take one deep breath, directing your air into your abdomen Think an affirming thought, such as "I've got this." Do 5 quick flicks of your pelvic floor Walk with control to the bathroom to void, or delay voiding  THE KNACK The Knack is a strategy you may use to help to reduce or prevent leakage or passing of urine, gas or feces during an activity that causes downward force on the pelvic floor muscles.    Activities that can cause downward pressure on the pelvic floor muscles include coughing, sneezing, laughing, bending, lifting, and transitioning from different body positions such as from laying down to sitting up and sitting to  standing.  To perform The Knack, consciously squeeze and lift your pelvic floor muscles to perform a strong, well-timed pelvic muscle contraction BEFORE AND DURING these activities above.  As your contraction gets more coordinated and your muscles get stronger, you will become more effective in controlling your experience of incontinence or gas passing during these activities.

## 2024-03-19 ENCOUNTER — Other Ambulatory Visit (HOSPITAL_BASED_OUTPATIENT_CLINIC_OR_DEPARTMENT_OTHER): Payer: Self-pay

## 2024-03-19 DIAGNOSIS — C44319 Basal cell carcinoma of skin of other parts of face: Secondary | ICD-10-CM | POA: Diagnosis not present

## 2024-03-19 MED ORDER — DOXYCYCLINE HYCLATE 100 MG PO CAPS
100.0000 mg | ORAL_CAPSULE | Freq: Two times a day (BID) | ORAL | 0 refills | Status: DC
  Filled 2024-03-19: qty 10, 5d supply, fill #0

## 2024-03-20 ENCOUNTER — Ambulatory Visit (HOSPITAL_BASED_OUTPATIENT_CLINIC_OR_DEPARTMENT_OTHER): Admitting: Physical Therapy

## 2024-03-20 ENCOUNTER — Other Ambulatory Visit: Payer: Self-pay

## 2024-03-20 ENCOUNTER — Encounter (HOSPITAL_BASED_OUTPATIENT_CLINIC_OR_DEPARTMENT_OTHER): Payer: Self-pay | Admitting: Physical Therapy

## 2024-03-20 ENCOUNTER — Encounter: Admitting: Physical Therapy

## 2024-03-20 DIAGNOSIS — R293 Abnormal posture: Secondary | ICD-10-CM | POA: Diagnosis not present

## 2024-03-20 DIAGNOSIS — M25512 Pain in left shoulder: Secondary | ICD-10-CM | POA: Diagnosis not present

## 2024-03-20 DIAGNOSIS — G8929 Other chronic pain: Secondary | ICD-10-CM | POA: Diagnosis not present

## 2024-03-20 DIAGNOSIS — M6281 Muscle weakness (generalized): Secondary | ICD-10-CM

## 2024-03-20 NOTE — Therapy (Signed)
 OUTPATIENT PHYSICAL THERAPY EVALUATION   Patient Name: Vanessa Trujillo MRN: [id number] DOB:[birth year], 83 y.o., female Today's Date: 03/21/2024  END OF SESSION:  PT End of Session - 03/20/24 1042     Visit Number 1    Number of Visits 9    Date for PT Re-Evaluation 05/17/24    Authorization Type [employer]    Authorization Time Period needs new auth on 9/11    PT Start Time 1017    PT Stop Time 1058    PT Time Calculation (min) 41 min    Activity Tolerance Patient tolerated treatment well    Behavior During Therapy WFL for tasks assessed/performed          Past Medical History:  Diagnosis Date   Allergic rhinitis    Aortic atherosclerosis (HCC)    Chronic UTI    DDD (degenerative disc disease), lumbar    Diverticulosis of colon    GERD (gastroesophageal reflux disease)    Hematuria    Hiatal hernia    large per 06/05/23 CT A/P in Epic   History of adenomatous polyp of colon    History of hepatitis    age 67, hospitalized   History of kidney stones    Hyperlipidemia    results in epic;   NUC 06-18-2016  LR w/ nuclear ef 82%;  echo 06-18-2026 normal w/ ef 60-65%;  CTC 08-21-2018  calcium score= 0.9, LR   Macular degeneration of both eyes    OAB (overactive bladder)    Follows w/ [facility].   Osteopenia    PONV (postoperative nausea and vomiting)    TMJ (sprain of temporomandibular joint)    around 2018   Urge incontinence    Uterine fibroid    Vitamin B12 deficiency    Vitamin D  deficiency    Past Surgical History:  Procedure Laterality Date   APPENDECTOMY  1959   CATARACT EXTRACTION W/ INTRAOCULAR LENS IMPLANT Right 09/08/2015   Dr. Camillo   CHOLECYSTECTOMY  1989   COLONOSCOPY WITH PROPOFOL   04/08/2012   dr donnald   CYSTOSCOPY N/A 07/16/2023   Procedure: CYSTOSCOPY with hydrodistention and intravesical botox  injection (100 units);  Surgeon: Marilynne Rosaline SAILOR, MD;  Location: [facility];  Service: Gynecology;   Laterality: N/A;   DILATATION & CURRETTAGE/HYSTEROSCOPY WITH RESECTOCOPE  04/12/1999   @ [facility] by dr fredrik sharps;    polypectomy   LITHOTRIPSY     TONSILLECTOMY AND ADENOIDECTOMY  1946   TUBAL LIGATION Bilateral 1979   Patient Active Problem List   Diagnosis Date Noted   Medication monitoring encounter 08/09/2022   Overactive bladder 10/18/2021   Recurrent UTI 10/18/2021   Depression 06/17/2016   Gastroesophageal reflux disease without esophagitis    Chest pain at rest 06/16/2016   Cough 06/16/2016   Hyperglycemia 06/16/2016   Calculus of kidney 04/16/2013   Urge incontinence 04/16/2013   Urinary urgency 04/16/2013   Infection of urinary tract 04/16/2013     REFERRING PROVIDER:  M25.512,G89.29 (ICD-10-CM) - Chronic left shoulder pain      REFERRING DIAG:  Genelle Standing, MD      Rationale for Evaluation and Treatment: Rehabilitation  THERAPY DIAG:  Muscle weakness (generalized)  Abnormal posture  Chronic left shoulder pain  ONSET DATE: severely 2 years ago, maybe 3   SUBJECTIVE:  SUBJECTIVE STATEMENT: Went to [facility] when shoulder started hurting, did xrays and saw nothing. Went back to PCP. It got to the point that I couldn't stand for my clothes to touch me. Asked about shingles, blood test was negative. Took gabapentin and told to put cold compress on it. Eventually severity decreased. Had episode of PT to treat LBP and did well but shoulder continued to be there. Denies UE N/T. What has changed is that I feel it more in the back of my arm and lateral border of scapula. I have always been able to have full ROM through GHJ. It feels like a web of something that grabs me. Neck pain has been minimal but is spreading up. Denies HA associated with pain. I have noticed some decrease in lifting  strength. I try to avoid bags on left shoulder.   PERTINENT HISTORY:  Vit D & B12 deficiency, osteopenia  PAIN:  Are you having pain? Yes: NPRS scale: moderate right now Pain location: Lt scapula Pain description: web, spreading, tight Aggravating factors: fatigue Relieving factors: ice  PRECAUTIONS:  None  RED FLAGS: None   WEIGHT BEARING RESTRICTIONS:  No  FALLS:  Has patient fallen in last 6 months? No   OCCUPATION:  Retired  PLOF:  Independent  PATIENT GOALS:  Decrease pain, improve strength overall, back to myself and doing the things I want to do.    OBJECTIVE:  Note: Objective measures were completed at Evaluation unless otherwise noted.  PATIENT SURVEYS:  UEFS  Extreme difficulty/unable (0), Quite a bit of difficulty (1), Moderate difficulty (2), Little difficulty (3), No difficulty (4) Survey date:  03/20/24  Any of your usual work, household or school activities 1  2. Your usual hobbies, recreational/sport activities 0   3. Lifting a bag of groceries to waist level 0   4. Lifting a bag of groceries above your head 0  5. Grooming your hair 2  6. Pushing up on your hands (I.e. from bathtub or chair) 2  7. Preparing food (I.e. peeling/cutting) 2  8. Driving  4  9. Vacuuming, sweeping, or raking 1  10. Dressing  4  11. Doing up buttons 4  12. Using tools/appliances 2  13. Opening doors 4  14. Cleaning  4  15. Tying or lacing shoes 4  16. Sleeping  3  17. Laundering clothes (I.e. washing, ironing, folding) 4  18. Opening a jar 1  19. Throwing a ball 4  20. Carrying a small suitcase with your affected limb.  0  Score total:  46     COGNITIVE STATUS: Within functional limits for tasks assessed   POSTURE:  rounded shoulders  HAND DOMINANCE:  Right   Body Part #1 Shoulder  PALPATION: EVAL: tightness in infraspinatus, upper trap & scalene palpated. TTP upper trap, rhomboids, lats, subscap  UPPER EXTREMITY ROM:  MMT Right eval  Left eval  Shoulder flexion 8.1 5.6  Shoulder extension 17.8 12.2  Shoulder abduction 8.0 5.1  Shoulder adduction        Shoulder internal rotation 8.2 7.3  Shoulder external rotation 10.1 5.9  Elbow flexion 8.6 6.9  Elbow extension 6.3 8.2  Grip strength 15 15   (Blank rows = not tested) EVAL: repeated shoulder flexion- good scapular motion but shoulder hike notable after 3 reps and verbalized tightness into upper trap Negative cervical distraction & compression with equal ROM noted, only upper trap tightness on left with ipsilateral rotation   TREATMENT DATE:   EVAL Discussed walking  program & current HEP   PATIENT EDUCATION:  Education details: Anatomy of condition, POC, HEP, exercise form/rationale Person educated: Patient Education method: Explanation, Demonstration, Tactile cues, Verbal cues, and Handouts Education comprehension: verbalized understanding, returned demonstration, verbal cues required, tactile cues required, and needs further education   HOME EXERCISE PROGRAM: Short walks through day adding to 2 miles.  Access Code: [email or web address] URL: [email or web address] Date: 03/20/2024 Prepared by: Harlene Cordon  Exercises - Sidelying Scapular Retraction  - 2 x daily - 7 x weekly - 2 sets - 10 reps - Supine Bilateral Shoulder Protraction  - 2 x daily - 7 x weekly - 2 sets - 10 reps - Wall Push Up with Plus  - 2 x daily - 7 x weekly - 2 sets - 10 reps - Squatting Shoulder Row with Anchored Resistance  - 1 x daily - 7 x weekly - 3 sets - 10 reps   ASSESSMENT:  CLINICAL IMPRESSION: Patient is a 83 y.o. F who was seen today for physical therapy evaluation and treatment for Lt scapular pain.  Pt presents with gross limitations in Lt UE strength vs Rt but both would benefit from increased strength. I have known this patient for 6 months and have noticed a gradual decline in energy levels, fatigue and exercise tolerance. I encouraged her to reach out to  her PCP for blood tests to check vitamin/mineral levels, thyroid and others that can present a limitation to generalized fatigue. I asked that she begin multiple, short walks during her day to add up to 2 miles rather that doing them all at once. While treating her for SIJ, posture was addressed and pt has done well maintaining upright posture so the scapula is not winging.    OBJECTIVE IMPAIRMENTS: decreased activity tolerance, decreased endurance, decreased strength, increased muscle spasms, impaired UE functional use, improper body mechanics, postural dysfunction, and pain.   ACTIVITY LIMITATIONS: carrying, lifting, sleeping, and reach over head  PARTICIPATION LIMITATIONS: meal prep, cleaning, and laundry  PERSONAL FACTORS: Age, Time since onset of injury/illness/exacerbation, and 1 comorbidity: osteopenia are also affecting patient's functional outcome.   REHAB POTENTIAL: Good  CLINICAL DECISION MAKING: Evolving/moderate complexity  EVALUATION COMPLEXITY: Moderate   GOALS: Goals reviewed with patient? Yes  SHORT TERM GOALS: Target date: 9/27  Pt will have spoken to PCP regarding blood testing Baseline: Goal status: INITIAL  2.  Independent in self-STM using tennis ball or something similar Baseline:  Goal status: INITIAL    LONG TERM GOALS: Target date: POC date  UEFS to improve by MDC Baseline:  Goal status: INITIAL  2.  Lt UE MMT at least 75% of Rt Baseline: see obj Goal status: INITIAL  3.  Pt will verbalize feeling that she has the strength to use her Lt UE to assist in functional activities Baseline:  Goal status: INITIAL  4.  Decrease pain by at least 50% in ADLs Baseline:  Goal status: INITIAL     PLAN:  PT FREQUENCY: 1-2x/week  PT DURATION: POC date  PLANNED INTERVENTIONS: 97164- PT Re-evaluation, 97750- Physical Performance Testing, 97110-Therapeutic exercises, 97530- Therapeutic activity, [id number]- Neuromuscular re-education, 97535- Self Care,  97140- Manual therapy, [phone]- Aquatic Therapy, [phone] (1-2 muscles), 20561 (3+ muscles)- Dry Needling, Patient/Family education, Taping, Joint mobilization, Spinal mobilization, and Cryotherapy.  PLAN FOR NEXT SESSION: periscap stability with core engagement, how is walking going, did she get blood tests ordered?   Harlene Cordon, PT, DPT 03/21/2024, 2:13 PM

## 2024-03-21 DIAGNOSIS — R5383 Other fatigue: Secondary | ICD-10-CM | POA: Diagnosis not present

## 2024-03-21 DIAGNOSIS — M545 Low back pain, unspecified: Secondary | ICD-10-CM | POA: Diagnosis not present

## 2024-03-24 ENCOUNTER — Other Ambulatory Visit (HOSPITAL_BASED_OUTPATIENT_CLINIC_OR_DEPARTMENT_OTHER): Payer: Self-pay

## 2024-03-24 ENCOUNTER — Ambulatory Visit (HOSPITAL_BASED_OUTPATIENT_CLINIC_OR_DEPARTMENT_OTHER): Admitting: Physical Therapy

## 2024-03-24 ENCOUNTER — Encounter (HOSPITAL_BASED_OUTPATIENT_CLINIC_OR_DEPARTMENT_OTHER): Payer: Self-pay | Admitting: Physical Therapy

## 2024-03-24 ENCOUNTER — Other Ambulatory Visit: Payer: Self-pay

## 2024-03-24 DIAGNOSIS — M6281 Muscle weakness (generalized): Secondary | ICD-10-CM | POA: Diagnosis not present

## 2024-03-24 DIAGNOSIS — R293 Abnormal posture: Secondary | ICD-10-CM

## 2024-03-24 DIAGNOSIS — M25512 Pain in left shoulder: Secondary | ICD-10-CM | POA: Diagnosis not present

## 2024-03-24 DIAGNOSIS — G8929 Other chronic pain: Secondary | ICD-10-CM | POA: Diagnosis not present

## 2024-03-24 NOTE — Therapy (Signed)
 OUTPATIENT PHYSICAL THERAPY EVALUATION   Patient Name: Vanessa Trujillo MRN: [id number] DOB:[birth year], 83 y.o., female Today's Date: 03/24/2024  END OF SESSION:  PT End of Session - 03/24/24 1120     Visit Number 2    Number of Visits 9    Date for PT Re-Evaluation 05/17/24    Authorization Type [employer]    Authorization Time Period needs new auth on 9/11    PT Start Time 1118   pt arrived late   PT Stop Time 1146    PT Time Calculation (min) 28 min    Activity Tolerance Patient tolerated treatment well    Behavior During Therapy WFL for tasks assessed/performed          Past Medical History:  Diagnosis Date   Allergic rhinitis    Aortic atherosclerosis (HCC)    Chronic UTI    DDD (degenerative disc disease), lumbar    Diverticulosis of colon    GERD (gastroesophageal reflux disease)    Hematuria    Hiatal hernia    large per 06/05/23 CT A/P in Epic   History of adenomatous polyp of colon    History of hepatitis    age 52, hospitalized   History of kidney stones    Hyperlipidemia    results in epic;   NUC 06-18-2016  LR w/ nuclear ef 82%;  echo 06-18-2026 normal w/ ef 60-65%;  CTC 08-21-2018  calcium score= 0.9, LR   Macular degeneration of both eyes    OAB (overactive bladder)    Follows w/ [facility].   Osteopenia    PONV (postoperative nausea and vomiting)    TMJ (sprain of temporomandibular joint)    around 2018   Urge incontinence    Uterine fibroid    Vitamin B12 deficiency    Vitamin D  deficiency    Past Surgical History:  Procedure Laterality Date   APPENDECTOMY  1959   CATARACT EXTRACTION W/ INTRAOCULAR LENS IMPLANT Right 09/08/2015   Dr. Camillo   CHOLECYSTECTOMY  1989   COLONOSCOPY WITH PROPOFOL   04/08/2012   dr donnald   CYSTOSCOPY N/A 07/16/2023   Procedure: CYSTOSCOPY with hydrodistention and intravesical botox  injection (100 units);  Surgeon: Marilynne Rosaline SAILOR, MD;  Location: [facility];  Service:  Gynecology;  Laterality: N/A;   DILATATION & CURRETTAGE/HYSTEROSCOPY WITH RESECTOCOPE  04/12/1999   @ [facility] by dr fredrik sharps;    polypectomy   LITHOTRIPSY     TONSILLECTOMY AND ADENOIDECTOMY  1946   TUBAL LIGATION Bilateral 1979   Patient Active Problem List   Diagnosis Date Noted   Medication monitoring encounter 08/09/2022   Overactive bladder 10/18/2021   Recurrent UTI 10/18/2021   Depression 06/17/2016   Gastroesophageal reflux disease without esophagitis    Chest pain at rest 06/16/2016   Cough 06/16/2016   Hyperglycemia 06/16/2016   Calculus of kidney 04/16/2013   Urge incontinence 04/16/2013   Urinary urgency 04/16/2013   Infection of urinary tract 04/16/2013     REFERRING PROVIDER:  M25.512,G89.29 (ICD-10-CM) - Chronic left shoulder pain      REFERRING DIAG:  Genelle Standing, MD      Rationale for Evaluation and Treatment: Rehabilitation  THERAPY DIAG:  Muscle weakness (generalized)  Abnormal posture  ONSET DATE: severely 2 years ago, maybe 3   SUBJECTIVE:  SUBJECTIVE STATEMENT: I got the blood work done. I saw a PA and nothing stood out on exam.   PERTINENT HISTORY:  Vit D & B12 deficiency, osteopenia  PAIN:  Are you having pain? Yes: NPRS scale: moderate right now Pain location: Lt scapula Pain description: web, spreading, tight Aggravating factors: fatigue Relieving factors: ice  PRECAUTIONS:  None  RED FLAGS: None   WEIGHT BEARING RESTRICTIONS:  No  FALLS:  Has patient fallen in last 6 months? No   OCCUPATION:  Retired  PLOF:  Independent  PATIENT GOALS:  Decrease pain, improve strength overall, back to myself and doing the things I want to do.    OBJECTIVE:  Note: Objective measures were completed at Evaluation unless otherwise noted.  PATIENT  SURVEYS:  UEFS  Extreme difficulty/unable (0), Quite a bit of difficulty (1), Moderate difficulty (2), Little difficulty (3), No difficulty (4) Survey date:  03/20/24  Any of your usual work, household or school activities 1  2. Your usual hobbies, recreational/sport activities 0   3. Lifting a bag of groceries to waist level 0   4. Lifting a bag of groceries above your head 0  5. Grooming your hair 2  6. Pushing up on your hands (I.e. from bathtub or chair) 2  7. Preparing food (I.e. peeling/cutting) 2  8. Driving  4  9. Vacuuming, sweeping, or raking 1  10. Dressing  4  11. Doing up buttons 4  12. Using tools/appliances 2  13. Opening doors 4  14. Cleaning  4  15. Tying or lacing shoes 4  16. Sleeping  3  17. Laundering clothes (I.e. washing, ironing, folding) 4  18. Opening a jar 1  19. Throwing a ball 4  20. Carrying a small suitcase with your affected limb.  0  Score total:  46     COGNITIVE STATUS: Within functional limits for tasks assessed   POSTURE:  rounded shoulders  HAND DOMINANCE:  Right   Body Part #1 Shoulder  PALPATION: EVAL: tightness in infraspinatus, upper trap & scalene palpated. TTP upper trap, rhomboids, lats, subscap  UPPER EXTREMITY ROM:  MMT Right eval Left eval  Shoulder flexion 8.1 5.6  Shoulder extension 17.8 12.2  Shoulder abduction 8.0 5.1  Shoulder adduction        Shoulder internal rotation 8.2 7.3  Shoulder external rotation 10.1 5.9  Elbow flexion 8.6 6.9  Elbow extension 6.3 8.2  Grip strength 15 15   (Blank rows = not tested) EVAL: repeated shoulder flexion- good scapular motion but shoulder hike notable after 3 reps and verbalized tightness into upper trap Negative cervical distraction & compression with equal ROM noted, only upper trap tightness on left with ipsilateral rotation   TREATMENT DATE:   03/24/24 Prone rib mobs- multiple cavitations with very gentle mobilization at vertebrocostal joints STM Lt  infraspinatus Breathing with focus to expand Lt upper, post quadrant of rib cage Cat/cow in standing with hands on table Open book  EVAL Discussed walking program & current HEP   PATIENT EDUCATION:  Education details: Anatomy of condition, POC, HEP, exercise form/rationale Person educated: Patient Education method: Explanation, Demonstration, Tactile cues, Verbal cues, and Handouts Education comprehension: verbalized understanding, returned demonstration, verbal cues required, tactile cues required, and needs further education   HOME EXERCISE PROGRAM: Short walks through day adding to 2 miles.  Access Code: [email or web address] URL: [email or web address] Date: 03/20/2024 Prepared by: Harlene Cordon  Exercises - Sidelying Scapular Retraction  - 2 x daily - 7 x weekly -  2 sets - 10 reps - Supine Bilateral Shoulder Protraction  - 2 x daily - 7 x weekly - 2 sets - 10 reps - Wall Push Up with Plus  - 2 x daily - 7 x weekly - 2 sets - 10 reps - Squatting Shoulder Row with Anchored Resistance  - 1 x daily - 7 x weekly - 3 sets - 10 reps   ASSESSMENT:  CLINICAL IMPRESSION: Very limited pressure required for cavitations to occur in thoracic region and limited lordosis in cat/cow indicating tightness limiting throacic mobility. Tactile cues were used to expand ribs via breathing as well as in exercises. Still waiting for blood test results from PCP.    OBJECTIVE IMPAIRMENTS: decreased activity tolerance, decreased endurance, decreased strength, increased muscle spasms, impaired UE functional use, improper body mechanics, postural dysfunction, and pain.   ACTIVITY LIMITATIONS: carrying, lifting, sleeping, and reach over head  PARTICIPATION LIMITATIONS: meal prep, cleaning, and laundry  PERSONAL FACTORS: Age, Time since onset of injury/illness/exacerbation, and 1 comorbidity: osteopenia are also affecting patient's functional outcome.   REHAB POTENTIAL: Good  CLINICAL DECISION  MAKING: Evolving/moderate complexity  EVALUATION COMPLEXITY: Moderate   GOALS: Goals reviewed with patient? Yes  SHORT TERM GOALS: Target date: 9/27  Pt will have spoken to PCP regarding blood testing Baseline: Goal status: INITIAL  2.  Independent in self-STM using tennis ball or something similar Baseline:  Goal status: INITIAL    LONG TERM GOALS: Target date: POC date  UEFS to improve by MDC Baseline:  Goal status: INITIAL  2.  Lt UE MMT at least 75% of Rt Baseline: see obj Goal status: INITIAL  3.  Pt will verbalize feeling that she has the strength to use her Lt UE to assist in functional activities Baseline:  Goal status: INITIAL  4.  Decrease pain by at least 50% in ADLs Baseline:  Goal status: INITIAL     PLAN:  PT FREQUENCY: 1-2x/week  PT DURATION: POC date  PLANNED INTERVENTIONS: 97164- PT Re-evaluation, 97750- Physical Performance Testing, 97110-Therapeutic exercises, 97530- Therapeutic activity, [id number]- Neuromuscular re-education, 97535- Self Care, [id number]- Manual therapy, [phone]- Aquatic Therapy, [phone] (1-2 muscles), 20561 (3+ muscles)- Dry Needling, Patient/Family education, Taping, Joint mobilization, Spinal mobilization, and Cryotherapy.  PLAN FOR NEXT SESSION: periscap stability with core engagement, how is walking going, did she get blood tests ordered?   Harlene Cordon, PT, DPT 03/24/2024, 1:03 PM

## 2024-03-26 ENCOUNTER — Other Ambulatory Visit: Payer: Self-pay

## 2024-03-26 ENCOUNTER — Ambulatory Visit (HOSPITAL_BASED_OUTPATIENT_CLINIC_OR_DEPARTMENT_OTHER): Admitting: Orthopaedic Surgery

## 2024-03-26 DIAGNOSIS — G8929 Other chronic pain: Secondary | ICD-10-CM | POA: Diagnosis not present

## 2024-03-26 DIAGNOSIS — M25512 Pain in left shoulder: Secondary | ICD-10-CM | POA: Diagnosis not present

## 2024-03-26 MED ORDER — LIDOCAINE HCL 1 % IJ SOLN
4.0000 mL | INTRAMUSCULAR | Status: AC | PRN
  Administered 2024-03-26: 4 mL

## 2024-03-26 MED ORDER — TRIAMCINOLONE ACETONIDE 40 MG/ML IJ SUSP
80.0000 mg | INTRAMUSCULAR | Status: AC | PRN
  Administered 2024-03-26: 80 mg via INTRA_ARTICULAR

## 2024-03-26 NOTE — Progress Notes (Signed)
 Chief Complaint: Lower back pain, left scapular pain     History of Present Illness:   03/26/2024: Radiating back pain.  She is here today with predominantly left posterior shoulder pain   Vanessa Trujillo is a 83 y.o. female presents with issues with her left posterior shoulder as well as the left lower back.  She states that she is concerned of a history of lumbar stenosis as her mother suffered this.  She does have pain that radiates down the posterior aspect of both buttocks as well as into both SI joints.  With regard to the left shoulder she is experiencing pain at the inferior angle of the left scapula.  She states this feels like internal shingles although her testing for this was negative.  She has been trialing naproxen with some relief.  She has not had any physical therapy    PMH/PSH/Family History/Social History/Meds/Allergies:    Past Medical History:  Diagnosis Date  . Allergic rhinitis   . Aortic atherosclerosis (HCC)   . Chronic UTI   . DDD (degenerative disc disease), lumbar   . Diverticulosis of colon   . GERD (gastroesophageal reflux disease)   . Hematuria   . Hiatal hernia    large per 06/05/23 CT A/P in Epic  . History of adenomatous polyp of colon   . History of hepatitis    age 80, hospitalized  . History of kidney stones   . Hyperlipidemia    results in epic;   NUC 06-18-2016  LR w/ nuclear ef 82%;  echo 06-18-2026 normal w/ ef 60-65%;  CTC 08-21-2018  calcium score= 0.9, LR  . Macular degeneration of both eyes   . OAB (overactive bladder)    Follows w/ [facility].  . Osteopenia   . PONV (postoperative nausea and vomiting)   . TMJ (sprain of temporomandibular joint)    around 2018  . Urge incontinence   . Uterine fibroid   . Vitamin B12 deficiency   . Vitamin D  deficiency    Past Surgical History:  Procedure Laterality Date  . APPENDECTOMY  1959  . CATARACT EXTRACTION W/ INTRAOCULAR LENS IMPLANT Right 09/08/2015   Dr. Camillo  .  CHOLECYSTECTOMY  1989  . COLONOSCOPY WITH PROPOFOL   04/08/2012   dr buccini  . CYSTOSCOPY N/A 07/16/2023   Procedure: CYSTOSCOPY with hydrodistention and intravesical botox  injection (100 units);  Surgeon: Marilynne Rosaline SAILOR, MD;  Location: [facility];  Service: Gynecology;  Laterality: N/A;  . DILATATION & CURRETTAGE/HYSTEROSCOPY WITH RESECTOCOPE  04/12/1999   @ [facility] by dr fredrik sharps;    polypectomy  . LITHOTRIPSY    . TONSILLECTOMY AND ADENOIDECTOMY  1946  . TUBAL LIGATION Bilateral 1979   Social History   Socioeconomic History  . Marital status: Widowed    Spouse name: Not on file  . Number of children: Not on file  . Years of education: Not on file  . Highest education level: Not on file  Occupational History  . Occupation: Retired.  Tobacco Use  . Smoking status: Never  . Smokeless tobacco: Never  Vaping Use  . Vaping status: Never Used  Substance and Sexual Activity  . Alcohol use: No  . Drug use: No  . Sexual activity: Not Currently    Partners: Male    Birth control/protection: Post-menopausal  Other Topics Concern  . Not on file  Social History Narrative   Pt widowed since 2012, lives alone.   Social Drivers of Health  Financial Resource Strain: Not on file  Food Insecurity: Not on file  Transportation Needs: Not on file  Physical Activity: Not on file  Stress: Not on file  Social Connections: Not on file   Family History  Problem Relation Age of Onset  . Breast cancer Mother 1  . Heart failure Father   . Emphysema Father        Died age 54  . Breast cancer Maternal Grandmother 50   Allergies  Allergen Reactions  . Codeine     Chest pain  . Atorvastatin     Other reaction(s): muscle  . Pravastatin     Other reaction(s): muscle pain  . Rosuvastatin Calcium     Other reaction(s): muscle pain  . Zoster Vac Recomb Adjuvanted Other (See Comments)    Shingles vaccine - very painful muscles and joints aches   Current Outpatient  Medications  Medication Sig Dispense Refill  . Ascorbic Acid (VITAMIN C ) 1000 MG tablet Take 1 tablet (1,000 mg total) by mouth in the morning, at noon, in the evening, and at bedtime. (Patient taking differently: Take 1,000 mg by mouth in the morning and at bedtime.) 120 tablet 2  . citalopram  (CELEXA ) 20 MG tablet Take 1 tablet (20 mg total) by mouth daily. 90 tablet 3  . doxycycline  (VIBRAMYCIN ) 100 MG capsule Take 1 capsule (100 mg total) by mouth 2 (two) times daily with meals. 10 capsule 0  . Estradiol  10 MCG TABS vaginal tablet Place 1 tablet (10 mcg total) vaginally 2 (two) times a week. 24 tablet 3  . Evolocumab  (REPATHA  SURECLICK) 140 MG/ML SOAJ Inject 140 mg into the skin every 14 (fourteen) days. 6 mL 3  . fluorouracil  (EFUDEX ) 5 % cream Apply a small amount twice a day as directed to affected areas on face x 2 weeks 40 g 0  . Multiple Vitamins-Minerals (PRESERVISION AREDS PO) Take by mouth.    . NONFORMULARY OR COMPOUNDED ITEM Amitriptyline 2.5%/ gabapentin 2.5%/ baclofen 2.5% in vaginal cream.  Place 1g daily in vaginal/ vulvar area.  Dispense 60g with 5 refills. 60 each 5  . omeprazole  (PRILOSEC) 10 MG capsule Take 1 capsule (10 mg total) by mouth daily. 90 capsule 4  . polyethylene glycol powder (GLYCOLAX /MIRALAX ) 17 GM/SCOOP powder Take 17 g by mouth daily. Drink 17g (1 scoop) dissolved in water  per day. 238 g 0  . pregabalin  (LYRICA ) 25 MG capsule Take 1 capsule (25 mg total) by mouth 3 (three) times daily. 90 capsule 1  . Probiotic Product (ALIGN) 4 MG CAPS     . sulfamethoxazole -trimethoprim  (BACTRIM ) 400-80 MG tablet Take 0.5 tablet by mouth daily. 50 tablet 0  . vitamin B-12 (CYANOCOBALAMIN ) 100 MCG tablet Take 100 mcg by mouth daily.     No current facility-administered medications for this visit.   No results found.   Review of Systems:   A ROS was performed including pertinent positives and negatives as documented in the HPI.  Physical Exam :   Constitutional: NAD  and appears stated age Neurological: Alert and oriented Psych: Appropriate affect and cooperative Last menstrual period 07/11/1999.   Comprehensive Musculoskeletal Exam:    Right hip with tenderness about the greater trochanter as well as the left as well.  She does have a significant Trendelenburg gait bilaterally.  Range of motion of both hips is to 30 degrees internal/external rotation without pain.  There is pain with resisted abduction bilaterally positive bilateral pain about the SI joints.   Imaging:  Xray (lumbar spine 4 views): Bilateral SI arthritis  MRI lumbar spine, bilateral hips: There is L3 on L4 retrolisthesis with bilateral neuroforaminal herniation   I personally reviewed and interpreted the radiographs.   Assessment and Plan:   83 y.o. female with left shoulder posterior trigger points which has been quite symptomatic.  At this time she has hoping to pursue a trigger point injection which I do believe would be an ideal treatment.  She will also consider massage.  - Left shoulder posterior ultrasound-guided injection  Verbal consent obtained    Procedure Note  Patient: RAHI CHANDONNET             Date of Birth: January 03, 1941           MRN: [id number]             Visit Date: 03/26/2024  Procedures: Visit Diagnoses: No diagnosis found.  Large Joint Inj on 03/26/2024 5:03 PM Indications: pain Details: 22 G 1.5 in needle, ultrasound-guided posterior approach  Arthrogram: No  Medications: 4 mL lidocaine  1 %; 80 mg triamcinolone  acetonide 40 MG/ML Outcome: tolerated well, no immediate complications Procedure, treatment alternatives, risks and benefits explained, specific risks discussed. Consent was given by the patient. Immediately prior to procedure a time out was called to verify the correct patient, procedure, equipment, support staff and site/side marked as required. Patient was prepped and draped in the usual sterile fashion.         I personally  saw and evaluated the patient, and participated in the management and treatment plan.  Elspeth Parker, MD Attending Physician, Orthopedic Surgery  This document was dictated using Dragon voice recognition software. A reasonable attempt at proof reading has been made to minimize errors.

## 2024-03-27 ENCOUNTER — Encounter: Admitting: Physical Therapy

## 2024-03-31 ENCOUNTER — Ambulatory Visit (HOSPITAL_BASED_OUTPATIENT_CLINIC_OR_DEPARTMENT_OTHER): Admitting: Physical Therapy

## 2024-03-31 ENCOUNTER — Encounter (HOSPITAL_BASED_OUTPATIENT_CLINIC_OR_DEPARTMENT_OTHER): Payer: Self-pay | Admitting: Physical Therapy

## 2024-03-31 DIAGNOSIS — R293 Abnormal posture: Secondary | ICD-10-CM

## 2024-03-31 DIAGNOSIS — M6281 Muscle weakness (generalized): Secondary | ICD-10-CM

## 2024-03-31 DIAGNOSIS — M25512 Pain in left shoulder: Secondary | ICD-10-CM | POA: Diagnosis not present

## 2024-03-31 DIAGNOSIS — G8929 Other chronic pain: Secondary | ICD-10-CM | POA: Diagnosis not present

## 2024-03-31 NOTE — Therapy (Signed)
 OUTPATIENT PHYSICAL THERAPY EVALUATION   Patient Name: Vanessa Trujillo MRN: [id number] DOB:02/13/1941, 83 y.o., female Today's Date: 03/31/2024  END OF SESSION:  PT End of Session - 03/31/24 1059     Visit Number 3    Number of Visits 9    Date for Recertification  05/17/24    Authorization Type [employer]    Authorization Time Period needs new auth on 9/11    PT Start Time 1059    PT Stop Time 1142    PT Time Calculation (min) 43 min    Activity Tolerance Patient tolerated treatment well    Behavior During Therapy WFL for tasks assessed/performed          Past Medical History:  Diagnosis Date   Allergic rhinitis    Aortic atherosclerosis (HCC)    Chronic UTI    DDD (degenerative disc disease), lumbar    Diverticulosis of colon    GERD (gastroesophageal reflux disease)    Hematuria    Hiatal hernia    large per 06/05/23 CT A/P in Epic   History of adenomatous polyp of colon    History of hepatitis    age 7, hospitalized   History of kidney stones    Hyperlipidemia    results in epic;   NUC 06-18-2016  LR w/ nuclear ef 82%;  echo 06-18-2026 normal w/ ef 60-65%;  CTC 08-21-2018  calcium score= 0.9, LR   Macular degeneration of both eyes    OAB (overactive bladder)    Follows w/ [facility].   Osteopenia    PONV (postoperative nausea and vomiting)    TMJ (sprain of temporomandibular joint)    around 2018   Urge incontinence    Uterine fibroid    Vitamin B12 deficiency    Vitamin D  deficiency    Past Surgical History:  Procedure Laterality Date   APPENDECTOMY  1959   CATARACT EXTRACTION W/ INTRAOCULAR LENS IMPLANT Right 09/08/2015   Dr. Camillo   CHOLECYSTECTOMY  1989   COLONOSCOPY WITH PROPOFOL   04/08/2012   dr donnald   CYSTOSCOPY N/A 07/16/2023   Procedure: CYSTOSCOPY with hydrodistention and intravesical botox  injection (100 units);  Surgeon: Marilynne Rosaline SAILOR, MD;  Location: [facility];  Service: Gynecology;   Laterality: N/A;   DILATATION & CURRETTAGE/HYSTEROSCOPY WITH RESECTOCOPE  04/12/1999   @ [facility] by dr fredrik sharps;    polypectomy   LITHOTRIPSY     TONSILLECTOMY AND ADENOIDECTOMY  1946   TUBAL LIGATION Bilateral 1979   Patient Active Problem List   Diagnosis Date Noted   Medication monitoring encounter 08/09/2022   Overactive bladder 10/18/2021   Recurrent UTI 10/18/2021   Depression 06/17/2016   Gastroesophageal reflux disease without esophagitis    Chest pain at rest 06/16/2016   Cough 06/16/2016   Hyperglycemia 06/16/2016   Calculus of kidney 04/16/2013   Urge incontinence 04/16/2013   Urinary urgency 04/16/2013   Infection of urinary tract 04/16/2013     REFERRING PROVIDER:  M25.512,G89.29 (ICD-10-CM) - Chronic left shoulder pain      REFERRING DIAG:  Genelle Standing, MD      Rationale for Evaluation and Treatment: Rehabilitation  THERAPY DIAG:  Muscle weakness (generalized)  Abnormal posture  ONSET DATE: severely 2 years ago, maybe 3   SUBJECTIVE:  SUBJECTIVE STATEMENT: Seeing PCP on Wednesday. New provider did trigger point injections- it has felt better. I don't feel like everything is gone, I am not having the severe cramps.   PERTINENT HISTORY:  Vit D & B12 deficiency, osteopenia  PAIN:  Are you having pain? Yes: NPRS scale: moderate right now Pain location: Lt scapula Pain description: web, spreading, tight Aggravating factors: fatigue Relieving factors: ice  PRECAUTIONS:  None  RED FLAGS: None   WEIGHT BEARING RESTRICTIONS:  No  FALLS:  Has patient fallen in last 6 months? No   OCCUPATION:  Retired  PLOF:  Independent  PATIENT GOALS:  Decrease pain, improve strength overall, back to myself and doing the things I want to do.    OBJECTIVE:  Note:  Objective measures were completed at Evaluation unless otherwise noted.  PATIENT SURVEYS:  UEFS  Extreme difficulty/unable (0), Quite a bit of difficulty (1), Moderate difficulty (2), Little difficulty (3), No difficulty (4) Survey date:  03/20/24  Any of your usual work, household or school activities 1  2. Your usual hobbies, recreational/sport activities 0   3. Lifting a bag of groceries to waist level 0   4. Lifting a bag of groceries above your head 0  5. Grooming your hair 2  6. Pushing up on your hands (I.e. from bathtub or chair) 2  7. Preparing food (I.e. peeling/cutting) 2  8. Driving  4  9. Vacuuming, sweeping, or raking 1  10. Dressing  4  11. Doing up buttons 4  12. Using tools/appliances 2  13. Opening doors 4  14. Cleaning  4  15. Tying or lacing shoes 4  16. Sleeping  3  17. Laundering clothes (I.e. washing, ironing, folding) 4  18. Opening a jar 1  19. Throwing a ball 4  20. Carrying a small suitcase with your affected limb.  0  Score total:  46     COGNITIVE STATUS: Within functional limits for tasks assessed   POSTURE:  rounded shoulders  HAND DOMINANCE:  Right   Body Part #1 Shoulder  PALPATION: EVAL: tightness in infraspinatus, upper trap & scalene palpated. TTP upper trap, rhomboids, lats, subscap  UPPER EXTREMITY ROM:  MMT Right eval Left eval  Shoulder flexion 8.1 5.6  Shoulder extension 17.8 12.2  Shoulder abduction 8.0 5.1  Shoulder adduction        Shoulder internal rotation 8.2 7.3  Shoulder external rotation 10.1 5.9  Elbow flexion 8.6 6.9  Elbow extension 6.3 8.2  Grip strength 15 15   (Blank rows = not tested) EVAL: repeated shoulder flexion- good scapular motion but shoulder hike notable after 3 reps and verbalized tightness into upper trap Negative cervical distraction & compression with equal ROM noted, only upper trap tightness on left with ipsilateral rotation   TREATMENT DATE:   9/22 Supine PNF diagonals with 2lb  weight- both UEs, significantly limited in reps on left Vs right Supine protraction 2lb, both hands holding weight & with left only holding weight, also without weight Supine ABC without weight Supine pec stretch Supine horiz abd red tband STM Lt periscapular and thoracic musculature  03/24/24 Prone rib mobs- multiple cavitations with very gentle mobilization at vertebrocostal joints STM Lt infraspinatus Breathing with focus to expand Lt upper, post quadrant of rib cage Cat/cow in standing with hands on table Open book   PATIENT EDUCATION:  Education details: Anatomy of condition, POC, HEP, exercise form/rationale Person educated: Patient Education method: Explanation, Demonstration, Tactile cues, Verbal cues, and Handouts  Education comprehension: verbalized understanding, returned demonstration, verbal cues required, tactile cues required, and needs further education   HOME EXERCISE PROGRAM: Short walks through day adding to 2 miles.  Access Code: [email or web address] URL: [email or web address] Date: 03/20/2024 Prepared by: Harlene Cordon     ASSESSMENT:  CLINICAL IMPRESSION: Pt experienced significant relief from needling with lidocaine . Able to feel lack of activation and fatigue rather than cramping/pain in motions of UEs today.    OBJECTIVE IMPAIRMENTS: decreased activity tolerance, decreased endurance, decreased strength, increased muscle spasms, impaired UE functional use, improper body mechanics, postural dysfunction, and pain.   ACTIVITY LIMITATIONS: carrying, lifting, sleeping, and reach over head  PARTICIPATION LIMITATIONS: meal prep, cleaning, and laundry  PERSONAL FACTORS: Age, Time since onset of injury/illness/exacerbation, and 1 comorbidity: osteopenia are also affecting patient's functional outcome.   REHAB POTENTIAL: Good  CLINICAL DECISION MAKING: Evolving/moderate complexity  EVALUATION COMPLEXITY: Moderate   GOALS: Goals reviewed with  patient? Yes  SHORT TERM GOALS: Target date: 9/27  Pt will have spoken to PCP regarding blood testing Baseline: Goal status: INITIAL  2.  Independent in self-STM using tennis ball or something similar Baseline:  Goal status: INITIAL    LONG TERM GOALS: Target date: POC date  UEFS to improve by MDC Baseline:  Goal status: INITIAL  2.  Lt UE MMT at least 75% of Rt Baseline: see obj Goal status: INITIAL  3.  Pt will verbalize feeling that she has the strength to use her Lt UE to assist in functional activities Baseline:  Goal status: INITIAL  4.  Decrease pain by at least 50% in ADLs Baseline:  Goal status: INITIAL     PLAN:  PT FREQUENCY: 1-2x/week  PT DURATION: POC date  PLANNED INTERVENTIONS: 97164- PT Re-evaluation, 97750- Physical Performance Testing, 97110-Therapeutic exercises, 97530- Therapeutic activity, [id number]- Neuromuscular re-education, 97535- Self Care, [id number]- Manual therapy, [phone]- Aquatic Therapy, [phone] (1-2 muscles), [id number] (3+ muscles)- Dry Needling, Patient/Family education, Taping, Joint mobilization, Spinal mobilization, and Cryotherapy.  PLAN FOR NEXT SESSION: periscap stability with core engagement, how is walking going?   Harlene Cordon, PT, DPT 03/31/2024, 11:51 AM

## 2024-04-01 ENCOUNTER — Ambulatory Visit: Admitting: Physical Therapy

## 2024-04-01 DIAGNOSIS — R279 Unspecified lack of coordination: Secondary | ICD-10-CM | POA: Diagnosis not present

## 2024-04-01 DIAGNOSIS — M6281 Muscle weakness (generalized): Secondary | ICD-10-CM

## 2024-04-01 DIAGNOSIS — G8929 Other chronic pain: Secondary | ICD-10-CM

## 2024-04-01 DIAGNOSIS — M25512 Pain in left shoulder: Secondary | ICD-10-CM | POA: Diagnosis not present

## 2024-04-01 DIAGNOSIS — R293 Abnormal posture: Secondary | ICD-10-CM

## 2024-04-01 NOTE — Patient Instructions (Signed)
 04/01/24: First thing: increasing water  intake, decreasing bladder irritant intake  Try to aim for 67 fl oz of water  daily if you can Try to limit bladder irritant consumption (green tea, caffeine, alcohol, artificial sugars, flavored water , citrus, milk) Try to cut off fluids 2-3 hours before bedtime  Make sure the first thing you consume and the last thing you consume is water  each day  If you consume milk for breakfast, try to avoid consuming it for the rest of the day

## 2024-04-01 NOTE — Therapy (Signed)
 OUTPATIENT PHYSICAL THERAPY FEMALE PELVIC TREATMENT   Patient Name: Vanessa Trujillo MRN: [id number] DOB:1940-08-06, 83 y.o., female Today's Date: 04/01/2024  END OF SESSION:  PT End of Session - 04/01/24 1326     Visit Number 4    Number of Visits 9    Date for Recertification  05/17/24    Authorization Type [employer]    Authorization Time Period [employer] Approved 16 visits-03/24/24-06/16/2024    Authorization - Visit Number 4    Authorization - Number of Visits 16    PT Start Time 1245    PT Stop Time 1320    PT Time Calculation (min) 35 min    Activity Tolerance Patient tolerated treatment well    Behavior During Therapy WFL for tasks assessed/performed           Past Medical History:  Diagnosis Date   Allergic rhinitis    Aortic atherosclerosis    Chronic UTI    DDD (degenerative disc disease), lumbar    Diverticulosis of colon    GERD (gastroesophageal reflux disease)    Hematuria    Hiatal hernia    large per 06/05/23 CT A/P in Epic   History of adenomatous polyp of colon    History of hepatitis    age 52, hospitalized   History of kidney stones    Hyperlipidemia    results in epic;   NUC 06-18-2016  LR w/ nuclear ef 82%;  echo 06-18-2026 normal w/ ef 60-65%;  CTC 08-21-2018  calcium score= 0.9, LR   Macular degeneration of both eyes    OAB (overactive bladder)    Follows w/ [facility].   Osteopenia    PONV (postoperative nausea and vomiting)    TMJ (sprain of temporomandibular joint)    around 2018   Urge incontinence    Uterine fibroid    Vitamin B12 deficiency    Vitamin D  deficiency    Past Surgical History:  Procedure Laterality Date   APPENDECTOMY  1959   CATARACT EXTRACTION W/ INTRAOCULAR LENS IMPLANT Right 09/08/2015   Dr. Camillo   CHOLECYSTECTOMY  1989   COLONOSCOPY WITH PROPOFOL   04/08/2012   dr donnald   CYSTOSCOPY N/A 07/16/2023   Procedure: CYSTOSCOPY with hydrodistention and intravesical botox  injection (100  units);  Surgeon: Marilynne Rosaline SAILOR, MD;  Location: [facility]  Service: Gynecology;  Laterality: N/A;   DILATATION & CURRETTAGE/HYSTEROSCOPY WITH RESECTOCOPE  04/12/1999   @ [facility] by dr fredrik sharps;    polypectomy   LITHOTRIPSY     TONSILLECTOMY AND ADENOIDECTOMY  1946   TUBAL LIGATION Bilateral 1979   Patient Active Problem List   Diagnosis Date Noted   Medication monitoring encounter 08/09/2022   Overactive bladder 10/18/2021   Recurrent UTI 10/18/2021   Depression 06/17/2016   Gastroesophageal reflux disease without esophagitis    Chest pain at rest 06/16/2016   Cough 06/16/2016   Hyperglycemia 06/16/2016   Calculus of kidney 04/16/2013   Urge incontinence 04/16/2013   Urinary urgency 04/16/2013   Infection of urinary tract 04/16/2013    PCP: Dwight Trula SQUIBB, MD  REFERRING PROVIDER: Zuleta, Kaitlin G, NP   REFERRING DIAG: N81.10 (ICD-10-CM) - Prolapse of anterior vaginal wall N32.81 (ICD-10-CM) - Overactive bladder  THERAPY DIAG:  Muscle weakness (generalized)  Abnormal posture  Chronic left shoulder pain  Unspecified lack of coordination  Rationale for Evaluation and Treatment: Rehabilitation  ONSET DATE: last year  SUBJECTIVE:  SUBJECTIVE STATEMENT: Her back is doing better than it was last visit. Since last visit, she got 6 or 7 steroid injections into her left shoulder. The shoulder is feeling a little better. She is going to see her primary care provider tomorrow to learn more about her low iron. She is finding the urge drill beneficial to help manage urinary urgency. Prolapse pressure has not been present and she feels fine with this. Urinary leakage has been no different, she still leaks consistently throughout the day and night.   From eval: Patient reports to  PFPT with overactive bladder/prolapse symptoms. At the end of last year, her lower back was in a lot of pain and she has been dealing with this since in other PT. She has had injections for her back pain but they do not last. She was scheduled for a sacral stimulator placement yesterday, but that has been cancelled until her back pain is under more control. She deals with urinary incontinence on a regular basis - she has tried pelvic PT, tibial nerve stimulation, botox , and other forms of treatment for her incontinence and has seen no improvement.   PAIN:  03/18/24: 0/10 back pain due to taking pain medication   Are you having pain? Yes NPRS scale: 7/10 Pain location: low back pain - no pelvic pain at rest  PRECAUTIONS: None  RED FLAGS: None   WEIGHT BEARING RESTRICTIONS: No  FALLS:  Has patient fallen in last 6 months? No  OCCUPATION: retired  ACTIVITY LEVEL : would like to get back to a regular routine after her back pain is resolved some - enjoys going to [employer] and working on machines. She enjoys walking her dog daily and she has had to slow down.   PLOF: Independent with basic ADLs  PATIENT GOALS: decrease urinary incontinence and address prolapse symptoms   PERTINENT HISTORY:  Is being currently considered to undergo sacral nerve stimulator insertion stage 1 once she learns more about back pain   BOWEL MOVEMENT: no issues with bowel movements  Pain with bowel movement: No Type of bowel movement:Type (Bristol Stool Scale) 4, Frequency within normal limits , Strain no, and Splinting no Fully empty rectum: Yes:   Leakage: No Pads: No Fiber supplement/laxative No  URINATION: Pain with urination: No Fully empty bladder: Nosometimes she feels like there is more that can come out  Stream: Strong Urgency: Yes - but it has changed over the years - urgency comes on more suddenly  Frequency: more than within normal limits - doesn't get up to void anymore - wears depends in the  nighttime  Leakage: Walking to the bathroom, Coughing, Exercise, Lifting, and Bending forward Pads: Yes: wears depends at night and wakes up with them soaked - wears pads during the day, changes it 6-8 times per day  Has a hx of chronic UTIs that are managed by medications.  INTERCOURSE: not currently sexually active   PREGNANCY: Vaginal deliveries 1 Episiotomy Yes   PROLAPSE: Pressure  OBJECTIVE:  Note: Objective measures were completed at Evaluation unless otherwise noted.  PATIENT SURVEYS:  PFIQ-7: 42 PFIQ-7 reevaluation day 03/18/24: 43  COGNITION: Overall cognitive status: Within functional limits for tasks assessed     SENSATION: Light touch: Appears intact  LUMBAR SPECIAL TESTS:  Single leg stance test: Positive  FUNCTIONAL TESTS:  Squat: only able to reach 50% of squat depth, general lumbopelvic stiffness present, bilateral dynamic knee valgus with loading   GAIT: Assistive device utilized: none Comments: mild trendelenburg gait pattern  with ambulation   POSTURE: increased thoracic kyphosis  LUMBARAROM/PROM:   Flexion 50% limited    Extension 50% limited   Right lateral flexion 25% limited   Left lateral flexion 25% limited   Right rotation 25% limited   Left rotation 25% limited    (Blank rows = not tested)  LOWER EXTREMITY ROM: within functional limits   LOWER EXTREMITY MMT: 4/5 bilateral knees and hips grossly   PALPATION:   General: no significant tenderness to palpation of bilateral adductors or hip flexors   Pelvic Alignment: within normal limits   Abdominal: upper chest breathing, abdominal bracing at rest, decreased lower rib excursion with inhalation                 External Perineal Exam: internal vaginal exam deferred today due to time limitations, will assess at follow up                              Internal Pelvic Floor: weakness throughout superficial and deep pelvic floor musculature bilaterally. Lack of coordination present  throughout with diaphragmatic breathing interventions.  Patient confirms identification and approves PT to assess internal pelvic floor and treatment Yes No emotional/communication barriers or cognitive limitation. Patient is motivated to learn. Patient understands and agrees with treatment goals and plan. PT explains patient will be examined in standing, sitting, and lying down to see how their muscles and joints work. When they are ready, they will be asked to remove their underwear so PT can examine their perineum. The patient is also given the option of providing their own chaperone as one is not provided in our facility. The patient also has the right and is explained the right to defer or refuse any part of the evaluation or treatment including the internal exam. With the patient's consent, PT will use one gloved finger to gently assess the muscles of the pelvic floor, seeing how well it contracts and relaxes and if there is muscle symmetry. After, the patient will get dressed and PT and patient will discuss exam findings and plan of care. PT and patient discuss plan of care, schedule, attendance policy and HEP activities.   PELVIC MMT:     MMT eval Re-eval 03/18/24  Vaginal 3/5, 10 quick flicks, 5 sec hold 3+/5, 10 quick flicks, 10 sec hold   Internal Anal Sphincter    External Anal Sphincter    Puborectalis    Diastasis Recti    (Blank rows = not tested)  TONE: Low bilaterally   PROLAPSE: None present in hooklying position   TODAY'S TREATMENT:                                                                                                                              DATE:   03/11/24: Seated pelvic floor contraction + diaphragmatic breathing 2x10  Seated quick flick pelvic floor contraction + diaphragmatic breathing 2x10  Sit to stand + diaphragmatic breathing 2x10  Sidelying clam + reverse clam + diaphragmatic breathing 2x10 each  Bridge + adductor ball squeeze + diaphragmatic  breathing 2x10   03/18/24: Internal pelvic floor reassessment: patient demonstrates improved coordination and strength in the pelvic floor since starting pelvic PT. She will occasionally bear down when trying to contract the pelvic floor, but with repetitive internal cueing for shortening the musculature with exhalation, she quickly corrects this and continues the exercise correctly.  Urge drill for managing urge urinary incontinence  Knack drill for managing stress urinary incontinence   04/01/24: Pt arrived 15 minutes to PT session today Patient education/lifestyle: water  intake recommendation in fluid oz, discussion of eliminating bladder irritants she currently consumes (milk and green tea daily), and cutting off fluids 2 hours before bedtime  Review of seated, standing, and dynamic pelvic floor exercises to ensure patient has exercises in varying forms of gravitational load Quick flick pelvic floor contractions in seated/standing 2x10  Standing pelvic floor contraction + diaphragmatic breathing 2x10  Discussion of iron deficiency and how this can affect daily function   PATIENT EDUCATION:  Education details: relative anatomy and the connection between the diaphragm and pelvic floor, bladder irritants and water  intake recommendations  Person educated: Patient Education method: Explanation, Demonstration, Tactile cues, Verbal cues, and Handouts Education comprehension: verbalized understanding, returned demonstration, verbal cues required, tactile cues required, and needs further education  HOME EXERCISE PROGRAM: Access Code: [email or web address] URL: [email or web address] Date: 04/01/2024 Prepared by: Celena Domino  Exercises - Seated Pelvic Floor Contraction  - 1 x daily - 7 x weekly - 2 sets - 10 reps - Standing Pelvic Floor Contraction  - 1 x daily - 7 x weekly - 2 sets - 10 reps - Seated Quick Flick Pelvic Floor Contractions  - 1 x daily - 7 x weekly - 2 sets - 10 reps - Sit to  Stand with Pelvic Floor Contraction  - 1 x daily - 7 x weekly - 2 sets - 10 reps - Supine Bridge with Mini Swiss Ball Between Knees  - 1 x daily - 7 x weekly - 2 sets - 10 reps - Clamshell  - 1 x daily - 7 x weekly - 2 sets - 10 reps - Sidelying Reverse Clamshell  - 1 x daily - 7 x weekly - 2 sets - 10 reps  ASSESSMENT:  CLINICAL IMPRESSION: Patient is a 83 y.o. female  who was seen today for physical therapy treatment for vaginal prolapse and urinary incontinence. She reports minimal progress in urinary symptom management, but no prolapse related concerns to report. We discussed all factors relating to her nighttime incontinence: bladder irritants, water  intake, cutting off fluids before bedtime. We also discussed how iron deficiency can affect overall fatigue levels and cause deep sleep in the evenings, which could be affecting her ability to control nighttime incontinence. Pelvic floor exercises progressed to highest level today (seated and standing) and quick flick contractions for endurance also progressed. Patient plans to see her PCP tomorrow to learn more about iron deficiency, then will report back to pelvic PT. Pt tolerated session well and would continue to benefit from skilled pelvic PT intervention 1x/wk for 6 visits to address deficits.   OBJECTIVE IMPAIRMENTS: decreased coordination, decreased endurance, decreased mobility, decreased ROM, and decreased strength.   ACTIVITY LIMITATIONS: continence  PARTICIPATION LIMITATIONS: community activity  PERSONAL FACTORS: Age, Past/current experiences, and Time since onset of injury/illness/exacerbation are also affecting patient's functional outcome.   REHAB POTENTIAL: Good  CLINICAL DECISION MAKING: Stable/uncomplicated  EVALUATION COMPLEXITY: Low   GOALS: Goals reviewed with patient? Yes  SHORT TERM GOALS: Target date: 02/07/2024  Pt will be independent with HEP.  Baseline: Goal status: GOAL MET   2.  Pt will be independent  with diaphragmatic breathing and down training activities in order to improve pelvic floor relaxation. Baseline:  Goal status: GOAL MET   3.  Pt will be independent with the knack, urge suppression technique, and double voiding in order to improve bladder habits and decrease urinary incontinence.   Baseline:  Goal status: GOAL MET   4.  Pt will have 50% reduced leakage during a typical day  Baseline:  Goal status: ONGOING  LONG TERM GOALS: Target date: 07/12/2024  Pt will be independent with advanced HEP.  Baseline:  Goal status: ONGOING  2.  Pt to demonstrate improved coordination of pelvic floor and breathing mechanics with 10# squat with appropriate synergistic patterns to decrease pain and leakage at least 75% of the time for improved ability to complete a 30 minute workout with strain at pelvic floor and symptoms.   Baseline:  Goal status: ONGOING  3.  Pt will have 75% reduced leakage during a typical day to improve quality of life and decrease pad usage throughout the day.  Baseline:  Goal status: ONGOING  4.  Pt to demonstrate at least 4+/5 bil hip strength for improved pelvic stability and functional squats without leakage.  Baseline:  Goal status: ONGOING  5.  Pt will be able to correctly perform diaphragmatic breathing and appropriate pressure management in order to prevent worsening vaginal wall laxity and improve pelvic floor A/ROM.   Baseline:  Goal status: ONGOING  PLAN:  PT FREQUENCY: 1-2x/week  PT DURATION: 6 weeks  PLANNED INTERVENTIONS: 97110-Therapeutic exercises, 97530- Therapeutic activity, 97112- Neuromuscular re-education, 97535- Self Care, [id number]- Manual therapy, Patient/Family education, Taping, Joint mobilization, Spinal mobilization, Scar mobilization, Cryotherapy, and Moist heat  PLAN FOR NEXT SESSION: progress pelvic floor training to seated position if she can tolerate this, work on standing training for pelvic floor with sheet around waist, review  knack and urge drill, continue hip and core strengthening   Celena JAYSON Domino, PT 04/01/2024, 1:28 PM

## 2024-04-02 DIAGNOSIS — D649 Anemia, unspecified: Secondary | ICD-10-CM | POA: Diagnosis not present

## 2024-04-02 DIAGNOSIS — E7800 Pure hypercholesterolemia, unspecified: Secondary | ICD-10-CM | POA: Diagnosis not present

## 2024-04-03 ENCOUNTER — Encounter: Admitting: Physical Therapy

## 2024-04-04 ENCOUNTER — Encounter (HOSPITAL_BASED_OUTPATIENT_CLINIC_OR_DEPARTMENT_OTHER): Payer: Self-pay | Admitting: Orthopaedic Surgery

## 2024-04-08 ENCOUNTER — Encounter (HOSPITAL_BASED_OUTPATIENT_CLINIC_OR_DEPARTMENT_OTHER): Payer: Self-pay

## 2024-04-08 ENCOUNTER — Ambulatory Visit (HOSPITAL_BASED_OUTPATIENT_CLINIC_OR_DEPARTMENT_OTHER)

## 2024-04-08 ENCOUNTER — Other Ambulatory Visit (HOSPITAL_BASED_OUTPATIENT_CLINIC_OR_DEPARTMENT_OTHER): Payer: Self-pay

## 2024-04-08 DIAGNOSIS — R293 Abnormal posture: Secondary | ICD-10-CM | POA: Diagnosis not present

## 2024-04-08 DIAGNOSIS — M6281 Muscle weakness (generalized): Secondary | ICD-10-CM | POA: Diagnosis not present

## 2024-04-08 DIAGNOSIS — G8929 Other chronic pain: Secondary | ICD-10-CM

## 2024-04-08 DIAGNOSIS — M25512 Pain in left shoulder: Secondary | ICD-10-CM | POA: Diagnosis not present

## 2024-04-08 NOTE — Therapy (Signed)
 OUTPATIENT PHYSICAL THERAPY EVALUATION   Patient Name: Vanessa Trujillo MRN: [id number] DOB:[birth year], 83 y.o., female Today's Date: 04/08/2024  END OF SESSION:  PT End of Session - 04/08/24 1130     Visit Number 5    Number of Visits 9    Date for Recertification  05/17/24    Authorization Type [employer] Medicare    Authorization Time Period [employer] Approved 16 visits-03/24/24-06/16/2024    Authorization - Visit Number 5    Authorization - Number of Visits 16    Progress Note Due on Visit 16    PT Start Time 1102    PT Stop Time 1145    PT Time Calculation (min) 43 min    Activity Tolerance Patient limited by fatigue    Behavior During Therapy WFL for tasks assessed/performed           Past Medical History:  Diagnosis Date   Allergic rhinitis    Aortic atherosclerosis    Chronic UTI    DDD (degenerative disc disease), lumbar    Diverticulosis of colon    GERD (gastroesophageal reflux disease)    Hematuria    Hiatal hernia    large per 06/05/23 CT A/P in Epic   History of adenomatous polyp of colon    History of hepatitis    age 15, hospitalized   History of kidney stones    Hyperlipidemia    results in epic;   NUC 06-18-2016  LR w/ nuclear ef 82%;  echo 06-18-2026 normal w/ ef 60-65%;  CTC 08-21-2018  calcium score= 0.9, LR   Macular degeneration of both eyes    OAB (overactive bladder)    Follows w/ [facility].   Osteopenia    PONV (postoperative nausea and vomiting)    TMJ (sprain of temporomandibular joint)    around 2018   Urge incontinence    Uterine fibroid    Vitamin B12 deficiency    Vitamin D  deficiency    Past Surgical History:  Procedure Laterality Date   APPENDECTOMY  1959   CATARACT EXTRACTION W/ INTRAOCULAR LENS IMPLANT Right 09/08/2015   Dr. Camillo   CHOLECYSTECTOMY  1989   COLONOSCOPY WITH PROPOFOL   04/08/2012   dr donnald   CYSTOSCOPY N/A 07/16/2023   Procedure: CYSTOSCOPY with hydrodistention and intravesical botox   injection (100 units);  Surgeon: Marilynne Rosaline SAILOR, MD;  Location: [facility];  Service: Gynecology;  Laterality: N/A;   DILATATION & CURRETTAGE/HYSTEROSCOPY WITH RESECTOCOPE  04/12/1999   @ [facility] by dr fredrik sharps;    polypectomy   LITHOTRIPSY     TONSILLECTOMY AND ADENOIDECTOMY  1946   TUBAL LIGATION Bilateral 1979   Patient Active Problem List   Diagnosis Date Noted   Medication monitoring encounter 08/09/2022   Overactive bladder 10/18/2021   Recurrent UTI 10/18/2021   Depression 06/17/2016   Gastroesophageal reflux disease without esophagitis    Chest pain at rest 06/16/2016   Cough 06/16/2016   Hyperglycemia 06/16/2016   Calculus of kidney 04/16/2013   Urge incontinence 04/16/2013   Urinary urgency 04/16/2013   Infection of urinary tract 04/16/2013     REFERRING PROVIDER:  M25.512,G89.29 (ICD-10-CM) - Chronic left shoulder pain      REFERRING DIAG:  Genelle Standing, MD      Rationale for Evaluation and Treatment: Rehabilitation  THERAPY DIAG:  Muscle weakness (generalized)  Abnormal posture  Chronic left shoulder pain  ONSET DATE: severely 2 years ago, maybe 3   SUBJECTIVE:  SUBJECTIVE STATEMENT: Pt reports she saw PCP last week who dx her with anemia and is planning to set up endoscopies for further investigation of potential internal blood loss. Has consultation on Thursday. Pt continues to c/o weakness.. Having bad muscle cramps in [place]. Reports improvement in back pain. The past 2 days I had no pain. My shoulder hasn't bothered me.  PERTINENT HISTORY:  Vit D & B12 deficiency, osteopenia  PAIN:  Are you having pain? Yes: NPRS scale: moderate right now Pain location: Lt scapula Pain description: web, spreading, tight Aggravating factors:  fatigue Relieving factors: ice  PRECAUTIONS:  None  RED FLAGS: None   WEIGHT BEARING RESTRICTIONS:  No  FALLS:  Has patient fallen in last 6 months? No   OCCUPATION:  Retired  PLOF:  Independent  PATIENT GOALS:  Decrease pain, improve strength overall, back to myself and doing the things I want to do.    OBJECTIVE:  Note: Objective measures were completed at Evaluation unless otherwise noted.  PATIENT SURVEYS:  UEFS  Extreme difficulty/unable (0), Quite a bit of difficulty (1), Moderate difficulty (2), Little difficulty (3), No difficulty (4) Survey date:  03/20/24  Any of your usual work, household or school activities 1  2. Your usual hobbies, recreational/sport activities 0   3. Lifting a bag of groceries to waist level 0   4. Lifting a bag of groceries above your head 0  5. Grooming your hair 2  6. Pushing up on your hands (I.e. from bathtub or chair) 2  7. Preparing food (I.e. peeling/cutting) 2  8. Driving  4  9. Vacuuming, sweeping, or raking 1  10. Dressing  4  11. Doing up buttons 4  12. Using tools/appliances 2  13. Opening doors 4  14. Cleaning  4  15. Tying or lacing shoes 4  16. Sleeping  3  17. Laundering clothes (I.e. washing, ironing, folding) 4  18. Opening a jar 1  19. Throwing a ball 4  20. Carrying a small suitcase with your affected limb.  0  Score total:  46     COGNITIVE STATUS: Within functional limits for tasks assessed   POSTURE:  rounded shoulders  HAND DOMINANCE:  Right   Body Part #1 Shoulder  PALPATION: EVAL: tightness in infraspinatus, upper trap & scalene palpated. TTP upper trap, rhomboids, lats, subscap  UPPER EXTREMITY ROM:  MMT Right eval Left eval  Shoulder flexion 8.1 5.6  Shoulder extension 17.8 12.2  Shoulder abduction 8.0 5.1  Shoulder adduction        Shoulder internal rotation 8.2 7.3  Shoulder external rotation 10.1 5.9  Elbow flexion 8.6 6.9  Elbow extension 6.3 8.2  Grip strength 15 15    (Blank rows = not tested) EVAL: repeated shoulder flexion- good scapular motion but shoulder hike notable after 3 reps and verbalized tightness into upper trap Negative cervical distraction & compression with equal ROM noted, only upper trap tightness on left with ipsilateral rotation   TREATMENT DATE:    04/08/24 Seated alternating arm raises Standing bil PNF x10 Ut stretching bil Seated marching 2# 2x10ea Seated LAQ 2# x15ea  Seated adductor squeeze  5 2x10 Seated fwd lumbar flexion stretch Discussion of diaphragmatic breathing  9/22 Supine PNF diagonals with 2lb weight- both UEs, significantly limited in reps on left Vs right Supine protraction 2lb, both hands holding weight & with left only holding weight, also without weight Supine ABC without weight Supine pec stretch Supine horiz abd red tband STM Lt  periscapular and thoracic musculature  03/24/24 Prone rib mobs- multiple cavitations with very gentle mobilization at vertebrocostal joints STM Lt infraspinatus Breathing with focus to expand Lt upper, post quadrant of rib cage Cat/cow in standing with hands on table Open book   PATIENT EDUCATION:  Education details: Anatomy of condition, POC, HEP, exercise form/rationale Person educated: Patient Education method: Explanation, Demonstration, Tactile cues, Verbal cues, and Handouts Education comprehension: verbalized understanding, returned demonstration, verbal cues required, tactile cues required, and needs further education   HOME EXERCISE PROGRAM: Short walks through day adding to 2 miles.  Access Code: [email or web address] URL: [email or web address] Date: 03/20/2024 Prepared by: Harlene Cordon     ASSESSMENT:  CLINICAL IMPRESSION: Pt experiencing light headedness throughout session, mostly with position changes. Able to tolerate seated position the best. STM performed to address ongoing triggerpoints in upper trap and periscapular mm. Good tolerance  for seated core and LE strengthening. Monitored light headedness this throughout session. Instructed pt to avoid operating vehicle if light headedness is present. Pt limited in activity performance today due to presence of light headedness.  Pt starting a new medication later today. Instructed her to reach out to MD without any questions/concerns regarding this.    OBJECTIVE IMPAIRMENTS: decreased activity tolerance, decreased endurance, decreased strength, increased muscle spasms, impaired UE functional use, improper body mechanics, postural dysfunction, and pain.   ACTIVITY LIMITATIONS: carrying, lifting, sleeping, and reach over head  PARTICIPATION LIMITATIONS: meal prep, cleaning, and laundry  PERSONAL FACTORS: Age, Time since onset of injury/illness/exacerbation, and 1 comorbidity: osteopenia are also affecting patient's functional outcome.   REHAB POTENTIAL: Good  CLINICAL DECISION MAKING: Evolving/moderate complexity  EVALUATION COMPLEXITY: Moderate   GOALS: Goals reviewed with patient? Yes  SHORT TERM GOALS: Target date: 9/27  Pt will have spoken to PCP regarding blood testing Baseline: Goal status: INITIAL  2.  Independent in self-STM using tennis ball or something similar Baseline:  Goal status: INITIAL    LONG TERM GOALS: Target date: POC date  UEFS to improve by MDC Baseline:  Goal status: INITIAL  2.  Lt UE MMT at least 75% of Rt Baseline: see obj Goal status: INITIAL  3.  Pt will verbalize feeling that she has the strength to use her Lt UE to assist in functional activities Baseline:  Goal status: INITIAL  4.  Decrease pain by at least 50% in ADLs Baseline:  Goal status: INITIAL     PLAN:  PT FREQUENCY: 1-2x/week  PT DURATION: POC date  PLANNED INTERVENTIONS: 97164- PT Re-evaluation, 97750- Physical Performance Testing, 97110-Therapeutic exercises, 97530- Therapeutic activity, [id number]- Neuromuscular re-education, 97535- Self Care, 02859- Manual  therapy, [phone]- Aquatic Therapy, [phone] (1-2 muscles), 20561 (3+ muscles)- Dry Needling, Patient/Family education, Taping, Joint mobilization, Spinal mobilization, and Cryotherapy.  PLAN FOR NEXT SESSION: periscap stability with core engagement, how is walking going?   Asberry BRAVO Addalyne Vandehei, PTA 04/08/2024, 12:15 PM

## 2024-04-09 ENCOUNTER — Ambulatory Visit: Attending: Obstetrics and Gynecology | Admitting: Physical Therapy

## 2024-04-09 DIAGNOSIS — M6281 Muscle weakness (generalized): Secondary | ICD-10-CM | POA: Insufficient documentation

## 2024-04-09 DIAGNOSIS — M25512 Pain in left shoulder: Secondary | ICD-10-CM | POA: Insufficient documentation

## 2024-04-09 DIAGNOSIS — R293 Abnormal posture: Secondary | ICD-10-CM | POA: Insufficient documentation

## 2024-04-09 DIAGNOSIS — G8929 Other chronic pain: Secondary | ICD-10-CM | POA: Insufficient documentation

## 2024-04-09 DIAGNOSIS — R279 Unspecified lack of coordination: Secondary | ICD-10-CM | POA: Insufficient documentation

## 2024-04-10 ENCOUNTER — Encounter: Admitting: Physical Therapy

## 2024-04-10 DIAGNOSIS — D649 Anemia, unspecified: Secondary | ICD-10-CM | POA: Diagnosis not present

## 2024-04-14 ENCOUNTER — Ambulatory Visit (HOSPITAL_BASED_OUTPATIENT_CLINIC_OR_DEPARTMENT_OTHER): Attending: Orthopaedic Surgery | Admitting: Physical Therapy

## 2024-04-14 ENCOUNTER — Other Ambulatory Visit (HOSPITAL_BASED_OUTPATIENT_CLINIC_OR_DEPARTMENT_OTHER): Payer: Self-pay

## 2024-04-14 ENCOUNTER — Other Ambulatory Visit: Payer: Self-pay

## 2024-04-14 ENCOUNTER — Encounter (HOSPITAL_BASED_OUTPATIENT_CLINIC_OR_DEPARTMENT_OTHER): Payer: Self-pay | Admitting: Physical Therapy

## 2024-04-14 DIAGNOSIS — R293 Abnormal posture: Secondary | ICD-10-CM | POA: Insufficient documentation

## 2024-04-14 DIAGNOSIS — M25512 Pain in left shoulder: Secondary | ICD-10-CM | POA: Insufficient documentation

## 2024-04-14 DIAGNOSIS — G8929 Other chronic pain: Secondary | ICD-10-CM | POA: Insufficient documentation

## 2024-04-14 DIAGNOSIS — M6281 Muscle weakness (generalized): Secondary | ICD-10-CM | POA: Insufficient documentation

## 2024-04-14 MED ORDER — DULCOLAX 5 MG PO TBEC
5.0000 mg | DELAYED_RELEASE_TABLET | ORAL | 0 refills | Status: AC
  Filled 2024-04-14: qty 4, 1d supply, fill #0

## 2024-04-14 MED ORDER — PLENVU 140 G PO SOLR
ORAL | 0 refills | Status: DC
  Filled 2024-04-14: qty 3, 1d supply, fill #0

## 2024-04-14 NOTE — Therapy (Signed)
 OUTPATIENT PHYSICAL THERAPY EVALUATION   Patient Name: Vanessa Trujillo MRN: [id number] DOB:23-Dec-1940, 83 y.o., female Today's Date: 04/14/2024  END OF SESSION:  PT End of Session - 04/14/24 1103     Visit Number 6    Number of Visits 9    Date for Recertification  05/17/24    Authorization Type [employer] Medicare    Authorization Time Period [employer] Approved 16 visits-03/24/24-06/16/2024    Authorization - Visit Number 6    Authorization - Number of Visits 16    Progress Note Due on Visit 16    PT Start Time 1100    PT Stop Time 1138    PT Time Calculation (min) 38 min    Activity Tolerance Patient limited by fatigue    Behavior During Therapy WFL for tasks assessed/performed           Past Medical History:  Diagnosis Date   Allergic rhinitis    Aortic atherosclerosis    Chronic UTI    DDD (degenerative disc disease), lumbar    Diverticulosis of colon    GERD (gastroesophageal reflux disease)    Hematuria    Hiatal hernia    large per 06/05/23 CT A/P in Epic   History of adenomatous polyp of colon    History of hepatitis    age 84, hospitalized   History of kidney stones    Hyperlipidemia    results in epic;   NUC 06-18-2016  LR w/ nuclear ef 82%;  echo 06-18-2026 normal w/ ef 60-65%;  CTC 08-21-2018  calcium score= 0.9, LR   Macular degeneration of both eyes    OAB (overactive bladder)    Follows w/ [facility].   Osteopenia    PONV (postoperative nausea and vomiting)    TMJ (sprain of temporomandibular joint)    around 2018   Urge incontinence    Uterine fibroid    Vitamin B12 deficiency    Vitamin D  deficiency    Past Surgical History:  Procedure Laterality Date   APPENDECTOMY  1959   CATARACT EXTRACTION W/ INTRAOCULAR LENS IMPLANT Right 09/08/2015   Dr. Camillo   CHOLECYSTECTOMY  1989   COLONOSCOPY WITH PROPOFOL   04/08/2012   dr donnald   CYSTOSCOPY N/A 07/16/2023   Procedure: CYSTOSCOPY with hydrodistention and intravesical botox   injection (100 units);  Surgeon: Marilynne Rosaline SAILOR, MD;  Location: [facility];  Service: Gynecology;  Laterality: N/A;   DILATATION & CURRETTAGE/HYSTEROSCOPY WITH RESECTOCOPE  04/12/1999   @ [facility] by dr fredrik sharps;    polypectomy   LITHOTRIPSY     TONSILLECTOMY AND ADENOIDECTOMY  1946   TUBAL LIGATION Bilateral 1979   Patient Active Problem List   Diagnosis Date Noted   Medication monitoring encounter 08/09/2022   Overactive bladder 10/18/2021   Recurrent UTI 10/18/2021   Depression 06/17/2016   Gastroesophageal reflux disease without esophagitis    Chest pain at rest 06/16/2016   Cough 06/16/2016   Hyperglycemia 06/16/2016   Calculus of kidney 04/16/2013   Urge incontinence 04/16/2013   Urinary urgency 04/16/2013   Infection of urinary tract 04/16/2013     REFERRING PROVIDER:  M25.512,G89.29 (ICD-10-CM) - Chronic left shoulder pain      REFERRING DIAG:  Genelle Standing, MD      Rationale for Evaluation and Treatment: Rehabilitation  THERAPY DIAG:  Muscle weakness (generalized)  Abnormal posture  Chronic left shoulder pain  ONSET DATE: severely 2 years ago, maybe 3   SUBJECTIVE:  SUBJECTIVE STATEMENT: I just finished with Lana and she did really well.  I am scheduled for endoscopy & colonoscopy 05/06/24. I am feeling worse overall, I can hardly do anything.  I am not even walking my dog- I walked around the block and wondered if I was going to make it back.   PERTINENT HISTORY:  Vit D & B12 deficiency, osteopenia  PAIN:  Are you having pain? Yes: NPRS scale: moderate right now Pain location: Lt scapula Pain description: web, spreading, tight Aggravating factors: fatigue Relieving factors: ice  PRECAUTIONS:  None  RED FLAGS: None   WEIGHT BEARING  RESTRICTIONS:  No  FALLS:  Has patient fallen in last 6 months? No   OCCUPATION:  Retired  PLOF:  Independent  PATIENT GOALS:  Decrease pain, improve strength overall, back to myself and doing the things I want to do.    OBJECTIVE:  Note: Objective measures were completed at Evaluation unless otherwise noted.  PATIENT SURVEYS:  UEFS  Extreme difficulty/unable (0), Quite a bit of difficulty (1), Moderate difficulty (2), Little difficulty (3), No difficulty (4) Survey date:  03/20/24 04/14/24  Any of your usual work, household or school activities 1 4  2. Your usual hobbies, recreational/sport activities 0 2   3. Lifting a bag of groceries to waist level 0 1   4. Lifting a bag of groceries above your head 0 0  5. Grooming your hair 2 4  6. Pushing up on your hands (I.e. from bathtub or chair) 2 3  7. Preparing food (I.e. peeling/cutting) 2 4  8. Driving  4 4  9. Vacuuming, sweeping, or raking 1 2  10. Dressing  4 4  11. Doing up buttons 4 4  12. Using tools/appliances 2 3  13. Opening doors 4 4  14. Cleaning  4 4  15. Tying or lacing shoes 4 4  16. Sleeping  3 3  17. Laundering clothes (I.e. washing, ironing, folding) 4 4  18. Opening a jar 1 1  19. Throwing a ball 4 1  20. Carrying a small suitcase with your affected limb.  0 1  Score total:  46 57     COGNITIVE STATUS: Within functional limits for tasks assessed   POSTURE:  rounded shoulders  HAND DOMINANCE:  Right   Body Part #1 Shoulder  PALPATION: EVAL: tightness in infraspinatus, upper trap & scalene palpated. TTP upper trap, rhomboids, lats, subscap  UPPER EXTREMITY ROM:  MMT Right eval Left eval  Shoulder flexion 8.1 5.6  Shoulder extension 17.8 12.2  Shoulder abduction 8.0 5.1  Shoulder adduction        Shoulder internal rotation 8.2 7.3  Shoulder external rotation 10.1 5.9  Elbow flexion 8.6 6.9  Elbow extension 6.3 8.2  Grip strength 15 15   (Blank rows = not tested) EVAL: repeated  shoulder flexion- good scapular motion but shoulder hike notable after 3 reps and verbalized tightness into upper trap Negative cervical distraction & compression with equal ROM noted, only upper trap tightness on left with ipsilateral rotation   TREATMENT DATE:    04/14/24: Supine chest press- holding green band taught x10 Supine shoulder flexion- holding green band taught x10 Supine SLR with opp UE reach to foot 2x5 each side Nustep L2 UE & LE  04/08/24 Seated alternating arm raises Standing bil PNF x10 Ut stretching bil Seated marching 2# 2x10ea Seated LAQ 2# x15ea  Seated adductor squeeze  5 2x10 Seated fwd lumbar flexion stretch Discussion of  diaphragmatic breathing  9/22 Supine PNF diagonals with 2lb weight- both UEs, significantly limited in reps on left Vs right Supine protraction 2lb, both hands holding weight & with left only holding weight, also without weight Supine ABC without weight Supine pec stretch Supine horiz abd red tband STM Lt periscapular and thoracic musculature    PATIENT EDUCATION:  Education details: Teacher, music of condition, POC, HEP, exercise form/rationale Person educated: Patient Education method: Explanation, Demonstration, Tactile cues, Verbal cues, and Handouts Education comprehension: verbalized understanding, returned demonstration, verbal cues required, tactile cues required, and needs further education   HOME EXERCISE PROGRAM: Short walks through day adding to 2 miles.  Access Code: [email or web address] URL: [email or web address] Date: 03/20/2024 Prepared by: Harlene Cordon     ASSESSMENT:  CLINICAL IMPRESSION: Minimal exercise tolerance with frequent rest breaks required due to fatigue. Encouraged her to go ahead and get the referral to hematology since it might take a while to get in anyways. Pt is very fatigued and appears pale with orthostatic hypotension noted each time she stands. Will place her on hold for now,  until after colonoscopy/endoscopy and then re-evaluate as she has such limited tolerance and is able to do HEP independently right now- will see pelvic floor PT next week. Encouraged her to reach out with any questions in the mean time.    OBJECTIVE IMPAIRMENTS: decreased activity tolerance, decreased endurance, decreased strength, increased muscle spasms, impaired UE functional use, improper body mechanics, postural dysfunction, and pain.   ACTIVITY LIMITATIONS: carrying, lifting, sleeping, and reach over head  PARTICIPATION LIMITATIONS: meal prep, cleaning, and laundry  PERSONAL FACTORS: Age, Time since onset of injury/illness/exacerbation, and 1 comorbidity: osteopenia are also affecting patient's functional outcome.   REHAB POTENTIAL: Good  CLINICAL DECISION MAKING: Evolving/moderate complexity  EVALUATION COMPLEXITY: Moderate   GOALS: Goals reviewed with patient? Yes  SHORT TERM GOALS: Target date: 9/27  Pt will have spoken to PCP regarding blood testing Baseline: Goal status: MET  2.  Independent in self-STM using tennis ball or something similar Baseline:  Goal status: MET & saw massage therapist    LONG TERM GOALS: Target date: POC date  UEFS to improve by MDC Baseline:  Goal status: INITIAL  2.  Lt UE MMT at least 75% of Rt Baseline: see obj Goal status: INITIAL  3.  Pt will verbalize feeling that she has the strength to use her Lt UE to assist in functional activities Baseline:  Goal status: INITIAL  4.  Decrease pain by at least 50% in ADLs Baseline:  Goal status: INITIAL     PLAN:  PT FREQUENCY: 1-2x/week  PT DURATION: POC date  PLANNED INTERVENTIONS: 97164- PT Re-evaluation, 97750- Physical Performance Testing, 97110-Therapeutic exercises, 97530- Therapeutic activity, [id number]- Neuromuscular re-education, 97535- Self Care, 02859- Manual therapy, [phone]- Aquatic Therapy, [phone] (1-2 muscles), 20561 (3+ muscles)- Dry Needling, Patient/Family  education, Taping, Joint mobilization, Spinal mobilization, and Cryotherapy.  PLAN FOR NEXT SESSION: periscap stability with core engagement, how is walking going?   Harlene Cordon, PT, DPT 04/14/2024, 11:39 AM

## 2024-04-15 ENCOUNTER — Telehealth: Payer: Self-pay | Admitting: Physical Therapy

## 2024-04-15 ENCOUNTER — Encounter: Admitting: Physical Therapy

## 2024-04-15 ENCOUNTER — Other Ambulatory Visit (HOSPITAL_BASED_OUTPATIENT_CLINIC_OR_DEPARTMENT_OTHER): Payer: Self-pay

## 2024-04-15 NOTE — Telephone Encounter (Signed)
 PT called pt to inform of her next scheduled visit, 10/14, which is her last scheduled visit. Pt informed of no-show status last session and asked to call back if changes are needed to future visit. Pt informed of cancellation policy and was made aware that if she no-shows again, all visits will be waitlist-only and scheduled 1 at a time. Celena Domino, PT, DPT 04/15/24 8:37 AM [facility] [address], [address] [place], [place] [address] Phone # [phone] Fax [phone]

## 2024-04-16 ENCOUNTER — Other Ambulatory Visit (HOSPITAL_BASED_OUTPATIENT_CLINIC_OR_DEPARTMENT_OTHER): Payer: Self-pay

## 2024-04-16 MED ORDER — COMIRNATY 30 MCG/0.3ML IM SUSY
0.3000 mL | PREFILLED_SYRINGE | Freq: Once | INTRAMUSCULAR | 0 refills | Status: AC
  Filled 2024-04-16: qty 0.3, 1d supply, fill #0

## 2024-04-21 ENCOUNTER — Encounter (HOSPITAL_BASED_OUTPATIENT_CLINIC_OR_DEPARTMENT_OTHER): Admitting: Physical Therapy

## 2024-04-22 ENCOUNTER — Encounter: Admitting: Physical Therapy

## 2024-04-28 ENCOUNTER — Other Ambulatory Visit (HOSPITAL_BASED_OUTPATIENT_CLINIC_OR_DEPARTMENT_OTHER): Payer: Self-pay

## 2024-04-28 ENCOUNTER — Other Ambulatory Visit: Payer: Self-pay

## 2024-04-28 ENCOUNTER — Ambulatory Visit: Admitting: Orthopedic Surgery

## 2024-04-28 ENCOUNTER — Other Ambulatory Visit: Payer: Self-pay | Admitting: Orthopedic Surgery

## 2024-04-28 DIAGNOSIS — M47819 Spondylosis without myelopathy or radiculopathy, site unspecified: Secondary | ICD-10-CM

## 2024-04-28 MED ORDER — PREGABALIN 25 MG PO CAPS
25.0000 mg | ORAL_CAPSULE | Freq: Three times a day (TID) | ORAL | 1 refills | Status: DC
  Filled 2024-04-28: qty 90, 30d supply, fill #0
  Filled 2024-05-25: qty 90, 30d supply, fill #1

## 2024-04-28 MED ORDER — FLUZONE HIGH-DOSE 0.5 ML IM SUSY
0.5000 mL | PREFILLED_SYRINGE | Freq: Once | INTRAMUSCULAR | 0 refills | Status: AC
  Filled 2024-04-28: qty 0.5, 1d supply, fill #0

## 2024-04-28 NOTE — Progress Notes (Signed)
 Orthopedic Spine Surgery Office Note   Assessment: Patient is a 83 y.o. female with low back pain.  Has DDD at L3/4 with facet arthropathy     Plan: -Patient has tried PT, tylenol , ibuprofen , lyrica , lumbar steroid injection -Continue with Lyrica  since that has been helpful for her -Recommended diagnostic/therapeutic facet injections.  Referral provided to her today -Will restart PT once her anemia has been worked up and addressed and her fatigue is better -Patient should return to office on as-needed basis     Patient expressed understanding of the plan and all questions were answered to the patient's satisfaction.    ___________________________________________________________________________     History:   Patient is a 83 y.o. female who presents today for follow up on her lumbar spine.  Patient had been having pain in her low back going into her bilateral lateral thighs.  Her leg pain has resolved since use of Lyrica .  She still has back pain.  She feels it in the mid lumbar region.  She has been feeling generally fatigued and has been diagnosed with anemia.  She is undergoing further workup for that.  She has held off on doing further physical therapy while this workup is ongoing.  She did find that physical therapy was helpful prior to developing this fatigue.  However, with how she feels she is not able to pursue it fully with PT.  Treatments tried: PT, tylenol , ibuprofen , lumbar steroid injection     Physical Exam:   General: no acute distress, appears stated age Neurologic: alert, answering questions appropriately, following commands Respiratory: unlabored breathing on room air, symmetric chest rise Psychiatric: appropriate affect, normal cadence to speech     MSK (spine):   -Strength exam                                                   Left                  Right EHL                              5/5                  5/5 TA                                 5/5                   5/5 GSC                             5/5                  5/5 Knee extension            5/5                  5/5 Hip flexion                    5/5                  5/5   -Sensory exam  Sensation intact to light touch in L3-S1 nerve distributions of bilateral lower extremities    Imaging: XR of the lumbar spine from 01/24/2024 were previously independently reviewed and interpreted, showing disc height loss at L3/4 and L5/S1.  No other significant degenerative changes seen.  No evidence of instability on flexion/tension views.  No fracture or dislocation seen.   MRI of the lumbar spine from 12/18/2023 was previously independently reviewed and interpreted, showing DDD at L3/4 and L5/S1.  No significant central, lateral recess, or foraminal stenosis seen.     Patient name: Vanessa Trujillo Patient MRN: [id number] Date of visit: 04/28/24

## 2024-05-01 ENCOUNTER — Other Ambulatory Visit (HOSPITAL_BASED_OUTPATIENT_CLINIC_OR_DEPARTMENT_OTHER): Payer: Self-pay

## 2024-05-02 ENCOUNTER — Other Ambulatory Visit (HOSPITAL_BASED_OUTPATIENT_CLINIC_OR_DEPARTMENT_OTHER): Payer: Self-pay

## 2024-05-05 ENCOUNTER — Other Ambulatory Visit: Payer: Self-pay | Admitting: Physical Medicine and Rehabilitation

## 2024-05-05 ENCOUNTER — Other Ambulatory Visit (HOSPITAL_BASED_OUTPATIENT_CLINIC_OR_DEPARTMENT_OTHER): Payer: Self-pay

## 2024-05-05 MED ORDER — DIAZEPAM 5 MG PO TABS
ORAL_TABLET | ORAL | 0 refills | Status: DC
  Filled 2024-05-05: qty 1, 1d supply, fill #0

## 2024-05-05 MED ORDER — OMEPRAZOLE 10 MG PO CPDR
10.0000 mg | DELAYED_RELEASE_CAPSULE | Freq: Every day | ORAL | 4 refills | Status: AC
  Filled 2024-05-05: qty 90, 90d supply, fill #0
  Filled 2024-07-28: qty 90, 90d supply, fill #1

## 2024-05-06 DIAGNOSIS — K222 Esophageal obstruction: Secondary | ICD-10-CM | POA: Diagnosis not present

## 2024-05-06 DIAGNOSIS — Z09 Encounter for follow-up examination after completed treatment for conditions other than malignant neoplasm: Secondary | ICD-10-CM | POA: Diagnosis not present

## 2024-05-06 DIAGNOSIS — K2930 Chronic superficial gastritis without bleeding: Secondary | ICD-10-CM | POA: Diagnosis not present

## 2024-05-06 DIAGNOSIS — K449 Diaphragmatic hernia without obstruction or gangrene: Secondary | ICD-10-CM | POA: Diagnosis not present

## 2024-05-06 DIAGNOSIS — D509 Iron deficiency anemia, unspecified: Secondary | ICD-10-CM | POA: Diagnosis not present

## 2024-05-06 DIAGNOSIS — K648 Other hemorrhoids: Secondary | ICD-10-CM | POA: Diagnosis not present

## 2024-05-06 DIAGNOSIS — Z860101 Personal history of adenomatous and serrated colon polyps: Secondary | ICD-10-CM | POA: Diagnosis not present

## 2024-05-06 DIAGNOSIS — Q399 Congenital malformation of esophagus, unspecified: Secondary | ICD-10-CM | POA: Diagnosis not present

## 2024-05-06 DIAGNOSIS — K5730 Diverticulosis of large intestine without perforation or abscess without bleeding: Secondary | ICD-10-CM | POA: Diagnosis not present

## 2024-05-06 DIAGNOSIS — K3189 Other diseases of stomach and duodenum: Secondary | ICD-10-CM | POA: Diagnosis not present

## 2024-05-06 DIAGNOSIS — D122 Benign neoplasm of ascending colon: Secondary | ICD-10-CM | POA: Diagnosis not present

## 2024-05-08 ENCOUNTER — Other Ambulatory Visit (HOSPITAL_COMMUNITY): Payer: Self-pay | Admitting: Gastroenterology

## 2024-05-08 ENCOUNTER — Telehealth (HOSPITAL_COMMUNITY): Payer: Self-pay | Admitting: Pharmacy Technician

## 2024-05-08 DIAGNOSIS — D509 Iron deficiency anemia, unspecified: Secondary | ICD-10-CM | POA: Insufficient documentation

## 2024-05-08 NOTE — Telephone Encounter (Signed)
 Auth Submission: NO AUTH NEEDED Site of care: [facility] Payer: [employer] MEDICARE Medication & CPT/J Code(s) submitted: Feraheme (ferumoxytol) [id number] Diagnosis Code: D50.9 Route of submission (phone, fax, portal):  Phone # Fax # Auth type: Buy/Bill HB Units/visits requested: 510mg  x 2 doses Reference number: [id number] Approval from: 05/08/2024 to 07/09/24     Dagoberto Armour, CPhT [facility] Phone: [phone] 05/08/2024

## 2024-05-09 ENCOUNTER — Other Ambulatory Visit (HOSPITAL_COMMUNITY): Payer: Self-pay | Admitting: Gastroenterology

## 2024-05-12 ENCOUNTER — Encounter: Payer: Self-pay | Admitting: Radiology

## 2024-05-12 NOTE — Therapy (Incomplete)
 OUTPATIENT PHYSICAL THERAPY EVALUATION   Patient Name: Vanessa Trujillo MRN: [id number] DOB:[birth year], 83 y.o., female Today's Date: 05/12/2024  END OF SESSION:     Past Medical History:  Diagnosis Date   Allergic rhinitis    Aortic atherosclerosis    Chronic UTI    DDD (degenerative disc disease), lumbar    Diverticulosis of colon    GERD (gastroesophageal reflux disease)    Hematuria    Hiatal hernia    large per 06/05/23 CT A/P in Epic   History of adenomatous polyp of colon    History of hepatitis    age 1, hospitalized   History of kidney stones    Hyperlipidemia    results in epic;   NUC 06-18-2016  LR w/ nuclear ef 82%;  echo 06-18-2026 normal w/ ef 60-65%;  CTC 08-21-2018  calcium score= 0.9, LR   Macular degeneration of both eyes    OAB (overactive bladder)    Follows w/ [facility].   Osteopenia    PONV (postoperative nausea and vomiting)    TMJ (sprain of temporomandibular joint)    around 2018   Urge incontinence    Uterine fibroid    Vitamin B12 deficiency    Vitamin D  deficiency    Past Surgical History:  Procedure Laterality Date   APPENDECTOMY  1959   CATARACT EXTRACTION W/ INTRAOCULAR LENS IMPLANT Right 09/08/2015   Dr. Camillo   CHOLECYSTECTOMY  1989   COLONOSCOPY WITH PROPOFOL   04/08/2012   dr donnald   CYSTOSCOPY N/A 07/16/2023   Procedure: CYSTOSCOPY with hydrodistention and intravesical botox  injection (100 units);  Surgeon: Marilynne Rosaline SAILOR, MD;  Location: [facility];  Service: Gynecology;  Laterality: N/A;   DILATATION & CURRETTAGE/HYSTEROSCOPY WITH RESECTOCOPE  04/12/1999   @ [facility] by dr fredrik sharps;    polypectomy   LITHOTRIPSY     TONSILLECTOMY AND ADENOIDECTOMY  1946   TUBAL LIGATION Bilateral 1979   Patient Active Problem List   Diagnosis Date Noted   Iron deficiency anemia, unspecified 05/08/2024   Medication monitoring encounter 08/09/2022   Overactive bladder 10/18/2021   Recurrent UTI 10/18/2021    Depression 06/17/2016   Gastroesophageal reflux disease without esophagitis    Chest pain at rest 06/16/2016   Cough 06/16/2016   Hyperglycemia 06/16/2016   Calculus of kidney 04/16/2013   Urge incontinence 04/16/2013   Urinary urgency 04/16/2013   Infection of urinary tract 04/16/2013     REFERRING PROVIDER:  M25.512,G89.29 (ICD-10-CM) - Chronic left shoulder pain      REFERRING DIAG:  Genelle Standing, MD      Rationale for Evaluation and Treatment: Rehabilitation  THERAPY DIAG:  No diagnosis found.  ONSET DATE: severely 2 years ago, maybe 3   SUBJECTIVE:  SUBJECTIVE STATEMENT: ***  LAST APPT: I just finished with Lana and she did really well.  I am scheduled for endoscopy & colonoscopy 05/06/24. I am feeling worse overall, I can hardly do anything.  I am not even walking my dog- I walked around the block and wondered if I was going to make it back.   PERTINENT HISTORY:  Vit D & B12 deficiency, osteopenia  PAIN:  Are you having pain? Yes: NPRS scale: moderate right now Pain location: Lt scapula Pain description: web, spreading, tight Aggravating factors: fatigue Relieving factors: ice  PRECAUTIONS:  None  RED FLAGS: None   WEIGHT BEARING RESTRICTIONS:  No  FALLS:  Has patient fallen in last 6 months? No   OCCUPATION:  Retired  PLOF:  Independent  PATIENT GOALS:  Decrease pain, improve strength overall, back to myself and doing the things I want to do.    OBJECTIVE:  Note: Objective measures were completed at Evaluation unless otherwise noted.  PATIENT SURVEYS:  UEFS  Extreme difficulty/unable (0), Quite a bit of difficulty (1), Moderate difficulty (2), Little difficulty (3), No difficulty (4) Survey date:  03/20/24 04/14/24  Any of your usual work, household  or school activities 1 4  2. Your usual hobbies, recreational/sport activities 0 2   3. Lifting a bag of groceries to waist level 0 1   4. Lifting a bag of groceries above your head 0 0  5. Grooming your hair 2 4  6. Pushing up on your hands (I.e. from bathtub or chair) 2 3  7. Preparing food (I.e. peeling/cutting) 2 4  8. Driving  4 4  9. Vacuuming, sweeping, or raking 1 2  10. Dressing  4 4  11. Doing up buttons 4 4  12. Using tools/appliances 2 3  13. Opening doors 4 4  14. Cleaning  4 4  15. Tying or lacing shoes 4 4  16. Sleeping  3 3  17. Laundering clothes (I.e. washing, ironing, folding) 4 4  18. Opening a jar 1 1  19. Throwing a ball 4 1  20. Carrying a small suitcase with your affected limb.  0 1  Score total:  46 57     COGNITIVE STATUS: Within functional limits for tasks assessed   POSTURE:  rounded shoulders  HAND DOMINANCE:  Right   Body Part #1 Shoulder  PALPATION: EVAL: tightness in infraspinatus, upper trap & scalene palpated. TTP upper trap, rhomboids, lats, subscap  UPPER EXTREMITY ROM:  MMT Right eval Left eval  Shoulder flexion 8.1 5.6  Shoulder extension 17.8 12.2  Shoulder abduction 8.0 5.1  Shoulder adduction        Shoulder internal rotation 8.2 7.3  Shoulder external rotation 10.1 5.9  Elbow flexion 8.6 6.9  Elbow extension 6.3 8.2  Grip strength 15 15   (Blank rows = not tested) EVAL: repeated shoulder flexion- good scapular motion but shoulder hike notable after 3 reps and verbalized tightness into upper trap Negative cervical distraction & compression with equal ROM noted, only upper trap tightness on left with ipsilateral rotation   TREATMENT DATE:    05/13/24: ***  04/14/24: Supine chest press- holding green band taught x10 Supine shoulder flexion- holding green band taught x10 Supine SLR with opp UE reach to foot 2x5 each side Nustep L2 UE & LE  04/08/24 Seated alternating arm raises Standing bil PNF x10 Ut  stretching bil Seated marching 2# 2x10ea Seated LAQ 2# x15ea  Seated adductor squeeze  5 2x10  Seated fwd lumbar flexion stretch Discussion of diaphragmatic breathing  9/22 Supine PNF diagonals with 2lb weight- both UEs, significantly limited in reps on left Vs right Supine protraction 2lb, both hands holding weight & with left only holding weight, also without weight Supine ABC without weight Supine pec stretch Supine horiz abd red tband STM Lt periscapular and thoracic musculature    PATIENT EDUCATION:  Education details: Teacher, Music of condition, POC, HEP, exercise form/rationale Person educated: Patient Education method: Explanation, Demonstration, Tactile cues, Verbal cues, and Handouts Education comprehension: verbalized understanding, returned demonstration, verbal cues required, tactile cues required, and needs further education   HOME EXERCISE PROGRAM: Short walks through day adding to 2 miles.  Access Code: [email or web address] URL: [email or web address] Date: 03/20/2024 Prepared by: Harlene Cordon     ASSESSMENT:  CLINICAL IMPRESSION: ***   OBJECTIVE IMPAIRMENTS: decreased activity tolerance, decreased endurance, decreased strength, increased muscle spasms, impaired UE functional use, improper body mechanics, postural dysfunction, and pain.   ACTIVITY LIMITATIONS: carrying, lifting, sleeping, and reach over head  PARTICIPATION LIMITATIONS: meal prep, cleaning, and laundry  PERSONAL FACTORS: Age, Time since onset of injury/illness/exacerbation, and 1 comorbidity: osteopenia are also affecting patient's functional outcome.   REHAB POTENTIAL: Good  CLINICAL DECISION MAKING: Evolving/moderate complexity  EVALUATION COMPLEXITY: Moderate   GOALS: Goals reviewed with patient? Yes  SHORT TERM GOALS: Target date: 9/27  Pt will have spoken to PCP regarding blood testing Baseline: Goal status: MET  2.  Independent in self-STM using tennis ball or  something similar Baseline:  Goal status: MET & saw massage therapist    LONG TERM GOALS: Target date: POC date  UEFS to improve by MDC Baseline:  Goal status: INITIAL  2.  Lt UE MMT at least 75% of Rt Baseline: see obj Goal status: INITIAL  3.  Pt will verbalize feeling that she has the strength to use her Lt UE to assist in functional activities Baseline:  Goal status: INITIAL  4.  Decrease pain by at least 50% in ADLs Baseline:  Goal status: INITIAL     PLAN:  PT FREQUENCY: 1-2x/week  PT DURATION: POC date  PLANNED INTERVENTIONS: [id number]- PT Re-evaluation, 97750- Physical Performance Testing, 97110-Therapeutic exercises, 97530- Therapeutic activity, [id number]- Neuromuscular re-education, 97535- Self Care, [id number]- Manual therapy, [phone]- Aquatic Therapy, [phone] (1-2 muscles), [phone] (3+ muscles)- Dry Needling, Patient/Family education, Taping, Joint mobilization, Spinal mobilization, and Cryotherapy.  PLAN FOR NEXT SESSION: periscap stability with core engagement, how is walking going?   Harlene Cordon, PT, DPT 05/12/2024, 9:19 PM

## 2024-05-13 ENCOUNTER — Other Ambulatory Visit (HOSPITAL_BASED_OUTPATIENT_CLINIC_OR_DEPARTMENT_OTHER): Payer: Self-pay

## 2024-05-13 ENCOUNTER — Ambulatory Visit (HOSPITAL_BASED_OUTPATIENT_CLINIC_OR_DEPARTMENT_OTHER): Admitting: Physical Therapy

## 2024-05-13 ENCOUNTER — Encounter: Payer: Self-pay | Admitting: Obstetrics and Gynecology

## 2024-05-13 ENCOUNTER — Encounter (HOSPITAL_BASED_OUTPATIENT_CLINIC_OR_DEPARTMENT_OTHER): Payer: Self-pay

## 2024-05-13 ENCOUNTER — Telehealth (HOSPITAL_BASED_OUTPATIENT_CLINIC_OR_DEPARTMENT_OTHER): Payer: Self-pay | Admitting: Physical Therapy

## 2024-05-13 ENCOUNTER — Ambulatory Visit: Admitting: Obstetrics and Gynecology

## 2024-05-13 VITALS — BP 100/62 | HR 86

## 2024-05-13 DIAGNOSIS — Z87440 Personal history of urinary (tract) infections: Secondary | ICD-10-CM | POA: Diagnosis not present

## 2024-05-13 DIAGNOSIS — N390 Urinary tract infection, site not specified: Secondary | ICD-10-CM

## 2024-05-13 DIAGNOSIS — N3281 Overactive bladder: Secondary | ICD-10-CM | POA: Diagnosis not present

## 2024-05-13 MED ORDER — SULFAMETHOXAZOLE-TRIMETHOPRIM 400-80 MG PO TABS
0.5000 | ORAL_TABLET | Freq: Every day | ORAL | 0 refills | Status: DC
  Filled 2024-05-13: qty 50, 100d supply, fill #0

## 2024-05-13 NOTE — Progress Notes (Signed)
 [facility] Return Visit  SUBJECTIVE  History of Present Illness: Vanessa Trujillo is a 83 y.o. female seen in follow-up for OAB, prolapse and recurrent UTI.  Has been dealing with anemia and back pain. She is scheduled for iron infusions. She has been feeling very tired so the incontinence is secondary.    She is leaking a lot during day and night. Does not always have a warning. Wears depends at night and leakage is worse.   Has not had a UTI or any symptoms currently. She is using vaginal estrogen twice a week and taking bactrim  prophylaxis.   Prior therapies: pelvic PT, myrbetriq , gemtesa , tolterodine, trospium , PTNS (at [facility]), PNE trial for SNM in Dec 2024, Cystoscopy with hydrodistention and intravesical botox  injection (100 units) in Jan 2025, botox  x3 at [facility].   Past Medical History: Patient  has a past medical history of Allergic rhinitis, Aortic atherosclerosis, Chronic UTI, DDD (degenerative disc disease), lumbar, Diverticulosis of colon, GERD (gastroesophageal reflux disease), Hematuria, Hiatal hernia, History of adenomatous polyp of colon, History of hepatitis, History of kidney stones, Hyperlipidemia, Macular degeneration of both eyes, OAB (overactive bladder), Osteopenia, PONV (postoperative nausea and vomiting), TMJ (sprain of temporomandibular joint), Urge incontinence, Uterine fibroid, Vitamin B12 deficiency, and Vitamin D  deficiency.   Past Surgical History: She  has a past surgical history that includes Tonsillectomy and adenoidectomy (1946); Appendectomy (1959); Tubal ligation (Bilateral, 1979); Cholecystectomy (1989); Lithotripsy; Cataract extraction w/ intraocular lens implant (Right, 09/08/2015); Cystoscopy (N/A, 07/16/2023); Dilatation & currettage/hysteroscopy with resectoscope (04/12/1999); and Colonoscopy with propofol  (04/08/2012).   Medications: She has a current medication list which includes the following prescription(s):  vitamin c , dulcolax, citalopram , diazepam, estradiol , repatha  sureclick, multiple vitamins-minerals, NONFORMULARY OR COMPOUNDED ITEM, omeprazole , pregabalin , align, sulfamethoxazole -trimethoprim , and vitamin b-12.   Allergies: Patient is allergic to codeine, atorvastatin, pravastatin, rosuvastatin calcium, and zoster vac recomb adjuvanted.   Social History: Patient  reports that she has never smoked. She has never used smokeless tobacco. She reports that she does not drink alcohol and does not use drugs.     OBJECTIVE     Physical Exam: Vitals:   05/13/24 1409  BP: 100/62  Pulse: 86    Gen: No apparent distress, A&O x 3.  Detailed Urogynecologic Evaluation:  Deferred. Prior exam showed:     ASSESSMENT AND PLAN    Vanessa Trujillo is a 83 y.o. with:  1. Overactive bladder   2. Recurrent UTI     - Since she has other pressing health concerns, will continue with bactrim  prophylaxis for 3 more months, refill provided. Continue vaginal estrogen.  - Again considering SNM stage 1 procedure in OR for trial or botox  200 units for OAB but she prefers to wait a few more months in hopes she will be feeling better after iron infections.   Follow up 3 months or sooner if needed   Vanessa LOISE Caper, MD  Time spent: I spent 20 minutes dedicated to the care of this patient on the date of this encounter to include pre-visit review of records, face-to-face time with the patient and post visit documentation.

## 2024-05-13 NOTE — Telephone Encounter (Signed)
 Pt called after the start time of the appointment. Having a really bad day. Found hernia that was contributing to anemia and is scheduled for lumbar epidural and 2 iron infusions. Rescheduled ERO to 11/15 after first infusion. Encouraged pt to continue moving as able and to reach out with any further questions.  Vanessa Trujillo C. Johnnie Goynes PT, DPT 05/13/24 10:40 AM

## 2024-05-22 ENCOUNTER — Other Ambulatory Visit: Payer: Self-pay

## 2024-05-22 ENCOUNTER — Ambulatory Visit: Admitting: Physical Medicine and Rehabilitation

## 2024-05-22 VITALS — BP 120/78 | HR 78

## 2024-05-22 DIAGNOSIS — M47816 Spondylosis without myelopathy or radiculopathy, lumbar region: Secondary | ICD-10-CM | POA: Diagnosis not present

## 2024-05-22 MED ORDER — METHYLPREDNISOLONE ACETATE 40 MG/ML IJ SUSP
40.0000 mg | Freq: Once | INTRAMUSCULAR | Status: AC
  Administered 2024-05-22: 40 mg

## 2024-05-22 NOTE — Progress Notes (Signed)
 Vanessa Trujillo - 83 y.o. female MRN [id number]  Date of birth: 1941/04/16  Office Visit Note: Visit Date: 05/22/2024 PCP: Dwight Trula SQUIBB, MD Referred by: Georgina Ozell LABOR, MD  Subjective: Chief Complaint  Patient presents with   Lower Back - Pain   HPI:  MELIA HOPES is a 83 y.o. female who comes in today at the request of Dr. Ozell Georgina for planned Bilateral  L3-4 Lumbar facet/medial branch block with fluoroscopic guidance.  The patient has failed conservative care including home exercise, medications, time and activity modification.  This injection will be diagnostic and hopefully therapeutic.  Please see requesting physician notes for further details and justification.  Exam has shown concordant pain with facet joint loading.   ROS Otherwise per HPI.  Assessment & Plan: Visit Diagnoses:    ICD-10-CM   1. Spondylosis without myelopathy or radiculopathy, lumbar region  M47.816 XR C-ARM NO REPORT    Facet Injection    methylPREDNISolone  acetate (DEPO-MEDROL ) injection 40 mg      Plan: No additional findings.   Meds & Orders:  Meds ordered this encounter  Medications   methylPREDNISolone  acetate (DEPO-MEDROL ) injection 40 mg    Orders Placed This Encounter  Procedures   Facet Injection   XR C-ARM NO REPORT    Follow-up: Return for visit to requesting provider as needed.   Procedures: No procedures performed  Lumbar Facet Joint Intra-Articular Injection(s) with Fluoroscopic Guidance  Patient: Vanessa Trujillo      Date of Birth: 17-Jun-1941 MRN: [id number] PCP: Dwight Trula SQUIBB, MD      Visit Date: 05/22/2024   Universal Protocol:    Date/Time: 05/22/2024  Consent Given By: the patient  Position: PRONE   Additional Comments: Vital signs were monitored before and after the procedure. Patient was prepped and draped in the usual sterile fashion. The correct patient, procedure, and site was verified.   Injection Procedure Details:  Procedure Site  One Meds Administered:  Meds ordered this encounter  Medications   methylPREDNISolone  acetate (DEPO-MEDROL ) injection 40 mg     Laterality: Bilateral  Location/Site:  L3-L4  Needle size: 22 guage  Needle type: Spinal  Needle Placement: Articular  Findings:  -Comments: Excellent flow of contrast producing a partial arthrogram.  Procedure Details: The fluoroscope beam is vertically oriented in AP, and the inferior recess is visualized beneath the lower pole of the inferior apophyseal process, which represents the target point for needle insertion. When direct visualization is difficult the target point is located at the medial projection of the vertebral pedicle. The region overlying each aforementioned target is locally anesthetized with a 1 to 2 ml. volume of 1% Lidocaine  without Epinephrine .   The spinal needle was inserted into each of the above mentioned facet joints using biplanar fluoroscopic guidance. A 0.25 to 0.5 ml. volume of Isovue -250 was injected and a partial facet joint arthrogram was obtained. A single spot film was obtained of the resulting arthrogram.    One to 1.25 ml of the steroid/anesthetic solution was then injected into each of the facet joints noted above.   Additional Comments:  The patient tolerated the procedure well Dressing: 2 x 2 sterile gauze and Band-Aid    Post-procedure details: Patient was observed during the procedure. Post-procedure instructions were reviewed.  Patient left the clinic in stable condition.    Clinical History: MR LUMBAR SPINE WO CONTRAST   CLINICAL HISTORY: Lumbar radiculopathy, symptoms persist with > 6 wks treatment   COMPARISON: None Available.  TECHNIQUE: MRI of the lumbar spine is performed according to our usual protocol with axial and sagittal multi sequence imaging.   FINDINGS: Mild retrolisthesis of L3 on L4. Moderate degenerative disc disease L3-4 with mild discogenic endplate marrow edema. The marrow  signal is otherwise unremarkable as well as the conus and cauda equina.   L3-4 has mild foraminal narrowing on the right due to broad-based disc bulge and facet hypertrophy.   L4-5 has mild foraminal narrowing on the left due to broad-based disc bulge and facet hypertrophy.   L5-S1 has mild foraminal and lateral recess narrowing on the left due to broad-based disc bulge and facet hypertrophy.   The imaged levels are otherwise unremarkable.   Benign-appearing renal cysts. The imaged retroperitoneal structures are otherwise unremarkable.     IMPRESSION: Mild retrolisthesis of L3 on L4 and moderate degenerative disc disease with mild endplate marrow edema.   Multilevel degenerative spondylosis with levels described in detail above.   No disc protrusion or evidence of impingement.   Electronically signed by: Reyes Frees MD 01/03/2024     Objective:  VS:  HT:    WT:   BMI:     BP:120/78  HR:78bpm  TEMP: ( )  RESP:  Physical Exam Vitals and nursing note reviewed.  Constitutional:      General: She is not in acute distress.    Appearance: Normal appearance. She is not ill-appearing.  HENT:     Head: Normocephalic and atraumatic.     Right Ear: External ear normal.     Left Ear: External ear normal.  Eyes:     Extraocular Movements: Extraocular movements intact.  Cardiovascular:     Rate and Rhythm: Normal rate.     Pulses: Normal pulses.  Pulmonary:     Effort: Pulmonary effort is normal. No respiratory distress.  Abdominal:     General: There is no distension.     Palpations: Abdomen is soft.  Musculoskeletal:        General: Tenderness present.     Cervical back: Neck supple.     Right lower leg: No edema.     Left lower leg: No edema.     Comments: Patient has good distal strength with no pain over the greater trochanters.  No clonus or focal weakness.  Skin:    Findings: No erythema, lesion or rash.  Neurological:     General: No focal deficit present.      Mental Status: She is alert and oriented to person, place, and time.     Sensory: No sensory deficit.     Motor: No weakness or abnormal muscle tone.     Coordination: Coordination normal.  Psychiatric:        Mood and Affect: Mood normal.        Behavior: Behavior normal.      Imaging: XR C-ARM NO REPORT Result Date: 05/22/2024 Please see Notes tab for imaging impression.

## 2024-05-22 NOTE — Progress Notes (Signed)
 Pain Scale   Average Pain 4 Patient advising she has chronic lower back pain radiating to right leg that wakes her up at night. Pain is constant.        +Driver, -BT, -Dye Allergies.

## 2024-05-22 NOTE — Procedures (Signed)
 Lumbar Facet Joint Intra-Articular Injection(s) with Fluoroscopic Guidance  Patient: Vanessa Trujillo      Date of Birth: [birth year] MRN: [id number] PCP: Dwight Trula SQUIBB, MD      Visit Date: 05/22/2024   Universal Protocol:    Date/Time: 05/22/2024  Consent Given By: the patient  Position: PRONE   Additional Comments: Vital signs were monitored before and after the procedure. Patient was prepped and draped in the usual sterile fashion. The correct patient, procedure, and site was verified.   Injection Procedure Details:  Procedure Site One Meds Administered:  Meds ordered this encounter  Medications   methylPREDNISolone  acetate (DEPO-MEDROL ) injection 40 mg     Laterality: Bilateral  Location/Site:  L3-L4  Needle size: 22 guage  Needle type: Spinal  Needle Placement: Articular  Findings:  -Comments: Excellent flow of contrast producing a partial arthrogram.  Procedure Details: The fluoroscope beam is vertically oriented in AP, and the inferior recess is visualized beneath the lower pole of the inferior apophyseal process, which represents the target point for needle insertion. When direct visualization is difficult the target point is located at the medial projection of the vertebral pedicle. The region overlying each aforementioned target is locally anesthetized with a 1 to 2 ml. volume of 1% Lidocaine  without Epinephrine .   The spinal needle was inserted into each of the above mentioned facet joints using biplanar fluoroscopic guidance. A 0.25 to 0.5 ml. volume of Isovue -250 was injected and a partial facet joint arthrogram was obtained. A single spot film was obtained of the resulting arthrogram.    One to 1.25 ml of the steroid/anesthetic solution was then injected into each of the facet joints noted above.   Additional Comments:  The patient tolerated the procedure well Dressing: 2 x 2 sterile gauze and Band-Aid    Post-procedure details: Patient was observed  during the procedure. Post-procedure instructions were reviewed.  Patient left the clinic in stable condition.

## 2024-05-23 ENCOUNTER — Encounter (HOSPITAL_COMMUNITY)
Admission: RE | Admit: 2024-05-23 | Discharge: 2024-05-23 | Disposition: A | Discharge status: Home / Self Care | Source: Ambulatory Visit | Attending: Gastroenterology | Admitting: Gastroenterology

## 2024-05-23 VITALS — BP 142/70 | HR 68 | Temp 98.0°F | Resp 16

## 2024-05-23 DIAGNOSIS — D509 Iron deficiency anemia, unspecified: Secondary | ICD-10-CM | POA: Diagnosis present

## 2024-05-23 MED ORDER — ACETAMINOPHEN 325 MG PO TABS
650.0000 mg | ORAL_TABLET | Freq: Once | ORAL | Status: AC
  Administered 2024-05-23: 650 mg via ORAL

## 2024-05-23 MED ORDER — ACETAMINOPHEN 325 MG PO TABS
ORAL_TABLET | ORAL | Status: AC
  Filled 2024-05-23: qty 2

## 2024-05-23 MED ORDER — SODIUM CHLORIDE 0.9 % IV SOLN
510.0000 mg | Freq: Once | INTRAVENOUS | Status: AC
  Administered 2024-05-23: 510 mg via INTRAVENOUS
  Filled 2024-05-23: qty 510

## 2024-05-24 ENCOUNTER — Encounter (HOSPITAL_BASED_OUTPATIENT_CLINIC_OR_DEPARTMENT_OTHER): Payer: Self-pay | Admitting: Physical Therapy

## 2024-05-24 ENCOUNTER — Ambulatory Visit (HOSPITAL_BASED_OUTPATIENT_CLINIC_OR_DEPARTMENT_OTHER): Attending: Orthopaedic Surgery | Admitting: Physical Therapy

## 2024-05-24 DIAGNOSIS — M25512 Pain in left shoulder: Secondary | ICD-10-CM | POA: Insufficient documentation

## 2024-05-24 DIAGNOSIS — M6281 Muscle weakness (generalized): Secondary | ICD-10-CM | POA: Diagnosis present

## 2024-05-24 DIAGNOSIS — G8929 Other chronic pain: Secondary | ICD-10-CM | POA: Insufficient documentation

## 2024-05-24 DIAGNOSIS — R293 Abnormal posture: Secondary | ICD-10-CM | POA: Insufficient documentation

## 2024-05-24 NOTE — Therapy (Signed)
 OUTPATIENT PHYSICAL THERAPY EVALUATION   Patient Name: Vanessa Trujillo MRN: [id number] DOB:[birth year], 83 y.o., female Today's Date: 05/24/2024  END OF SESSION:  PT End of Session - 05/24/24 1027     Visit Number 7    Date for Recertification  06/16/24    Authorization Type [employer]    Authorization Time Period [employer] Approved 16 visits-03/24/24-06/16/2024    Authorization - Number of Visits 16    Progress Note Due on Visit 16    PT Start Time 1026    PT Stop Time 1101    PT Time Calculation (min) 35 min    Activity Tolerance Patient limited by fatigue    Behavior During Therapy WFL for tasks assessed/performed           Past Medical History:  Diagnosis Date   Allergic rhinitis    Aortic atherosclerosis    Chronic UTI    DDD (degenerative disc disease), lumbar    Diverticulosis of colon    GERD (gastroesophageal reflux disease)    Hematuria    Hiatal hernia    large per 06/05/23 CT A/P in Epic   History of adenomatous polyp of colon    History of hepatitis    age 26, hospitalized   History of kidney stones    Hyperlipidemia    results in epic;   NUC 06-18-2016  LR w/ nuclear ef 82%;  echo 06-18-2026 normal w/ ef 60-65%;  CTC 08-21-2018  calcium score= 0.9, LR   Macular degeneration of both eyes    OAB (overactive bladder)    Follows w/ [facility].   Osteopenia    PONV (postoperative nausea and vomiting)    TMJ (sprain of temporomandibular joint)    around 2018   Urge incontinence    Uterine fibroid    Vitamin B12 deficiency    Vitamin D  deficiency    Past Surgical History:  Procedure Laterality Date   APPENDECTOMY  1959   CATARACT EXTRACTION W/ INTRAOCULAR LENS IMPLANT Right 09/08/2015   Dr. Camillo   CHOLECYSTECTOMY  1989   COLONOSCOPY WITH PROPOFOL   04/08/2012   dr donnald   CYSTOSCOPY N/A 07/16/2023   Procedure: CYSTOSCOPY with hydrodistention and intravesical botox  injection (100 units);  Surgeon: Marilynne Rosaline SAILOR, MD;   Location: [facility];  Service: Gynecology;  Laterality: N/A;   DILATATION & CURRETTAGE/HYSTEROSCOPY WITH RESECTOCOPE  04/12/1999   @ [facility] by dr fredrik sharps;    polypectomy   LITHOTRIPSY     TONSILLECTOMY AND ADENOIDECTOMY  1946   TUBAL LIGATION Bilateral 1979   Patient Active Problem List   Diagnosis Date Noted   Iron deficiency anemia, unspecified 05/08/2024   Medication monitoring encounter 08/09/2022   Overactive bladder 10/18/2021   Recurrent UTI 10/18/2021   Depression 06/17/2016   Gastroesophageal reflux disease without esophagitis    Chest pain at rest 06/16/2016   Cough 06/16/2016   Hyperglycemia 06/16/2016   Calculus of kidney 04/16/2013   Urge incontinence 04/16/2013   Urinary urgency 04/16/2013   Infection of urinary tract 04/16/2013     REFERRING PROVIDER:  M25.512,G89.29 (ICD-10-CM) - Chronic left shoulder pain      REFERRING DIAG:  Genelle Standing, MD      Rationale for Evaluation and Treatment: Rehabilitation  THERAPY DIAG:  Muscle weakness (generalized)  Abnormal posture  Chronic left shoulder pain  ONSET DATE: severely 2 years ago, maybe 3   SUBJECTIVE:  SUBJECTIVE STATEMENT: Progress Note Reporting Period 03/20/24 to 05/24/24   See note below for Objective Data and Assessment of Progress/Goals.   L3-4 Lumbar facet/medial branch block with fluoroscopic guidance on 05/22/24. It went better than expected I had an iron infusion. I wanted to feel more enegery but I know it will take time.  I am extremely weak, back is not as bad.  Lt scapula still flares up. It hasn't been as bad since DN but still flares up sometimes. Massage with Gareth has been going really well.  MD will not operate on hiatal hernia but they say that is what is causing the anemia  from what I understood.  Second iron infusion next Friday.  I do exercises every morning the bed.  If I do simple things like load the washer with clothes and before I know it I need to sit down.    PERTINENT HISTORY:  Vit D & B12 deficiency, osteopenia  PAIN:  Are you having pain? Yes: NPRS scale: moderate right now Pain location: Lt scapula Pain description: web, spreading, tight Aggravating factors: fatigue Relieving factors: ice  PRECAUTIONS:  None  RED FLAGS: None   WEIGHT BEARING RESTRICTIONS:  No  FALLS:  Has patient fallen in last 6 months? No   OCCUPATION:  Retired  PLOF:  Independent  PATIENT GOALS:  Decrease pain, improve strength overall, back to myself and doing the things I want to do.    OBJECTIVE:  Note: Objective measures were completed at Evaluation unless otherwise noted.  PATIENT SURVEYS:  UEFS  Extreme difficulty/unable (0), Quite a bit of difficulty (1), Moderate difficulty (2), Little difficulty (3), No difficulty (4) Survey date:  03/20/24 04/14/24  Any of your usual work, household or school activities 1 4  2. Your usual hobbies, recreational/sport activities 0 2   3. Lifting a bag of groceries to waist level 0 1   4. Lifting a bag of groceries above your head 0 0  5. Grooming your hair 2 4  6. Pushing up on your hands (I.e. from bathtub or chair) 2 3  7. Preparing food (I.e. peeling/cutting) 2 4  8. Driving  4 4  9. Vacuuming, sweeping, or raking 1 2  10. Dressing  4 4  11. Doing up buttons 4 4  12. Using tools/appliances 2 3  13. Opening doors 4 4  14. Cleaning  4 4  15. Tying or lacing shoes 4 4  16. Sleeping  3 3  17. Laundering clothes (I.e. washing, ironing, folding) 4 4  18. Opening a jar 1 1  19. Throwing a ball 4 1  20. Carrying a small suitcase with your affected limb.  0 1  Score total:  46 57     COGNITIVE STATUS: Within functional limits for tasks assessed   POSTURE:  rounded shoulders  HAND DOMINANCE:   Right   Body Part #1 Shoulder  PALPATION: EVAL: tightness in infraspinatus, upper trap & scalene palpated. TTP upper trap, rhomboids, lats, subscap  UPPER EXTREMITY ROM:  MMT Right eval Left eval  Shoulder flexion 8.1 5.6  Shoulder extension 17.8 12.2  Shoulder abduction 8.0 5.1  Shoulder adduction        Shoulder internal rotation 8.2 7.3  Shoulder external rotation 10.1 5.9  Elbow flexion 8.6 6.9  Elbow extension 6.3 8.2  Grip strength 15 15   (Blank rows = not tested) EVAL: repeated shoulder flexion- good scapular motion but shoulder hike notable after 3 reps and verbalized tightness  into upper trap Negative cervical distraction & compression with equal ROM noted, only upper trap tightness on left with ipsilateral rotation   TREATMENT DATE:   05/24/24: See plan  04/14/24: Supine chest press- holding green band taught x10 Supine shoulder flexion- holding green band taught x10 Supine SLR with opp UE reach to foot 2x5 each side Nustep L2 UE & LE  04/08/24 Seated alternating arm raises Standing bil PNF x10 Ut stretching bil Seated marching 2# 2x10ea Seated LAQ 2# x15ea  Seated adductor squeeze  5 2x10 Seated fwd lumbar flexion stretch Discussion of diaphragmatic breathing  9/22 Supine PNF diagonals with 2lb weight- both UEs, significantly limited in reps on left Vs right Supine protraction 2lb, both hands holding weight & with left only holding weight, also without weight Supine ABC without weight Supine pec stretch Supine horiz abd red tband STM Lt periscapular and thoracic musculature    PATIENT EDUCATION:  Education details: Teacher, Music of condition, POC, HEP, exercise form/rationale Person educated: Patient Education method: Programmer, Multimedia, Demonstration, Tactile cues, Verbal cues, and Handouts Education comprehension: verbalized understanding, returned demonstration, verbal cues required, tactile cues required, and needs further education   HOME  EXERCISE PROGRAM: Short walks through day adding to 2 miles.  Access Code: [email or web address] URL: [email or web address] Date: 03/20/2024 Prepared by: Harlene Cordon     ASSESSMENT:  CLINICAL IMPRESSION: Pt arrived with continued high levels of fatigue so exercise and MMT were not appropriate today. We spent the session discussing how to function through the day in short, manageable activities. I asked that she begin mindful meditations in between activities and be okay with short duration even though they are less than what she was doing in the past. Will f/u in about 2 weeks and be in communication about her daily experiences.    OBJECTIVE IMPAIRMENTS: decreased activity tolerance, decreased endurance, decreased strength, increased muscle spasms, impaired UE functional use, improper body mechanics, postural dysfunction, and pain.   ACTIVITY LIMITATIONS: carrying, lifting, sleeping, and reach over head  PARTICIPATION LIMITATIONS: meal prep, cleaning, and laundry  PERSONAL FACTORS: Age, Time since onset of injury/illness/exacerbation, and 1 comorbidity: osteopenia are also affecting patient's functional outcome.   REHAB POTENTIAL: Good  CLINICAL DECISION MAKING: Evolving/moderate complexity  EVALUATION COMPLEXITY: Moderate   GOALS: Goals reviewed with patient? Yes  SHORT TERM GOALS: Target date: 9/27  Pt will have spoken to PCP regarding blood testing Baseline: Goal status: MET  2.  Independent in self-STM using tennis ball or something similar Baseline:  Goal status: MET & saw massage therapist    LONG TERM GOALS: Target date: POC date  UEFS to improve by MDC Baseline:  Goal status: INITIAL  2.  Lt UE MMT at least 75% of Rt Baseline: see obj Goal status: INITIAL  3.  Pt will verbalize feeling that she has the strength to use her Lt UE to assist in functional activities Baseline:  Goal status: I do not feel like anything is strong  4.  Decrease pain  by at least 50% in ADLs Baseline:  Goal status: MET regarding pain, fatigue is the limiting factor     PLAN:  PT FREQUENCY: 1-2x/week  PT DURATION: POC date  PLANNED INTERVENTIONS: 97164- PT Re-evaluation, 97750- Physical Performance Testing, 97110-Therapeutic exercises, 97530- Therapeutic activity, [id number]- Neuromuscular re-education, 97535- Self Care, 02859- Manual therapy, [id number]- Aquatic Therapy, [phone] (1-2 muscles), 20561 (3+ muscles)- Dry Needling, Patient/Family education, Taping, Joint mobilization, Spinal mobilization, and Cryotherapy.  PLAN FOR NEXT SESSION: periscap stability with core engagement, how  is walking going?   Harlene Cordon, PT, DPT 05/24/2024, 12:47 PM

## 2024-05-26 ENCOUNTER — Other Ambulatory Visit (HOSPITAL_BASED_OUTPATIENT_CLINIC_OR_DEPARTMENT_OTHER): Payer: Self-pay

## 2024-05-26 ENCOUNTER — Other Ambulatory Visit: Payer: Self-pay

## 2024-05-29 ENCOUNTER — Encounter: Payer: Self-pay | Admitting: Physical Medicine and Rehabilitation

## 2024-05-30 ENCOUNTER — Encounter (HOSPITAL_BASED_OUTPATIENT_CLINIC_OR_DEPARTMENT_OTHER): Payer: Self-pay

## 2024-05-30 ENCOUNTER — Ambulatory Visit (HOSPITAL_BASED_OUTPATIENT_CLINIC_OR_DEPARTMENT_OTHER): Payer: Self-pay | Admitting: Obstetrics & Gynecology

## 2024-05-30 ENCOUNTER — Encounter (HOSPITAL_COMMUNITY)
Admission: RE | Admit: 2024-05-30 | Discharge: 2024-05-30 | Disposition: A | Discharge status: Home / Self Care | Source: Ambulatory Visit | Attending: Gastroenterology | Admitting: Gastroenterology

## 2024-05-30 VITALS — BP 137/74 | HR 70 | Temp 97.5°F | Resp 16

## 2024-05-30 DIAGNOSIS — D509 Iron deficiency anemia, unspecified: Secondary | ICD-10-CM | POA: Diagnosis not present

## 2024-05-30 MED ORDER — SODIUM CHLORIDE 0.9 % IV SOLN
510.0000 mg | Freq: Once | INTRAVENOUS | Status: AC
  Administered 2024-05-30: 510 mg via INTRAVENOUS
  Filled 2024-05-30: qty 510

## 2024-05-30 MED ORDER — ACETAMINOPHEN 325 MG PO TABS
650.0000 mg | ORAL_TABLET | Freq: Once | ORAL | Status: AC
  Administered 2024-05-30: 650 mg via ORAL

## 2024-05-30 MED ORDER — ACETAMINOPHEN 325 MG PO TABS
ORAL_TABLET | ORAL | Status: AC
  Filled 2024-05-30: qty 2

## 2024-06-04 ENCOUNTER — Other Ambulatory Visit (HOSPITAL_BASED_OUTPATIENT_CLINIC_OR_DEPARTMENT_OTHER): Payer: Self-pay

## 2024-06-07 ENCOUNTER — Other Ambulatory Visit (HOSPITAL_BASED_OUTPATIENT_CLINIC_OR_DEPARTMENT_OTHER): Payer: Self-pay

## 2024-06-13 ENCOUNTER — Encounter (HOSPITAL_BASED_OUTPATIENT_CLINIC_OR_DEPARTMENT_OTHER): Payer: Self-pay | Admitting: Physical Therapy

## 2024-06-13 ENCOUNTER — Ambulatory Visit (HOSPITAL_BASED_OUTPATIENT_CLINIC_OR_DEPARTMENT_OTHER): Attending: Orthopaedic Surgery | Admitting: Physical Therapy

## 2024-06-13 DIAGNOSIS — G8929 Other chronic pain: Secondary | ICD-10-CM | POA: Diagnosis present

## 2024-06-13 DIAGNOSIS — M6281 Muscle weakness (generalized): Secondary | ICD-10-CM | POA: Diagnosis present

## 2024-06-13 DIAGNOSIS — M25512 Pain in left shoulder: Secondary | ICD-10-CM | POA: Diagnosis present

## 2024-06-13 DIAGNOSIS — R293 Abnormal posture: Secondary | ICD-10-CM | POA: Diagnosis present

## 2024-06-13 NOTE — Therapy (Signed)
 OUTPATIENT PHYSICAL THERAPY EVALUATION   Patient Name: Vanessa Trujillo MRN: [id number] DOB:02-15-1941, 83 y.o., female Today's Date: 06/13/2024  END OF SESSION:  PT End of Session - 06/13/24 0937     Visit Number 8    Date for Recertification  06/16/24    Authorization Type [employer]    Authorization Time Period [employer] Approved 16 visits-03/24/24-06/16/2024    Authorization - Number of Visits 16    Progress Note Due on Visit 16    PT Start Time 0935    PT Stop Time 1010    PT Time Calculation (min) 35 min    Activity Tolerance Patient limited by fatigue    Behavior During Therapy WFL for tasks assessed/performed            Past Medical History:  Diagnosis Date   Allergic rhinitis    Aortic atherosclerosis    Chronic UTI    DDD (degenerative disc disease), lumbar    Diverticulosis of colon    GERD (gastroesophageal reflux disease)    Hematuria    Hiatal hernia    large per 06/05/23 CT A/P in Epic   History of adenomatous polyp of colon    History of hepatitis    age 10, hospitalized   History of kidney stones    Hyperlipidemia    results in epic;   NUC 06-18-2016  LR w/ nuclear ef 82%;  echo 06-18-2026 normal w/ ef 60-65%;  CTC 08-21-2018  calcium score= 0.9, LR   Macular degeneration of both eyes    OAB (overactive bladder)    Follows w/ [facility].   Osteopenia    PONV (postoperative nausea and vomiting)    TMJ (sprain of temporomandibular joint)    around 2018   Urge incontinence    Uterine fibroid    Vitamin B12 deficiency    Vitamin D  deficiency    Past Surgical History:  Procedure Laterality Date   APPENDECTOMY  1959   CATARACT EXTRACTION W/ INTRAOCULAR LENS IMPLANT Right 09/08/2015   Dr. Camillo   CHOLECYSTECTOMY  1989   COLONOSCOPY WITH PROPOFOL   04/08/2012   dr donnald   CYSTOSCOPY N/A 07/16/2023   Procedure: CYSTOSCOPY with hydrodistention and intravesical botox  injection (100 units);  Surgeon: Marilynne Rosaline SAILOR, MD;   Location: [facility];  Service: Gynecology;  Laterality: N/A;   DILATATION & CURRETTAGE/HYSTEROSCOPY WITH RESECTOCOPE  04/12/1999   @ [facility] by dr fredrik sharps;    polypectomy   LITHOTRIPSY     TONSILLECTOMY AND ADENOIDECTOMY  1946   TUBAL LIGATION Bilateral 1979   Patient Active Problem List   Diagnosis Date Noted   Iron deficiency anemia, unspecified 05/08/2024   Medication monitoring encounter 08/09/2022   Overactive bladder 10/18/2021   Recurrent UTI 10/18/2021   Depression 06/17/2016   Gastroesophageal reflux disease without esophagitis    Chest pain at rest 06/16/2016   Cough 06/16/2016   Hyperglycemia 06/16/2016   Calculus of kidney 04/16/2013   Urge incontinence 04/16/2013   Urinary urgency 04/16/2013   Infection of urinary tract 04/16/2013     REFERRING PROVIDER:  M25.512,G89.29 (ICD-10-CM) - Chronic left shoulder pain      REFERRING DIAG:  Genelle Standing, MD      Rationale for Evaluation and Treatment: Rehabilitation  THERAPY DIAG:  Muscle weakness (generalized)  Abnormal posture  Chronic left shoulder pain  ONSET DATE: severely 2 years ago, maybe 3   SUBJECTIVE:  SUBJECTIVE STATEMENT: I feel like I have some energy. Iron levels are trending in the right direction. What I am worried about right now is balance. I fear falling and I live alone. I don't feel comfortable walking my dog. I don't think the lumbar injection helped much- still hurting and night and going down my Rt leg. OB says my bone density looks good.  MD visit in 2 weeks for iron infusion. Going to see PCP next week.   PERTINENT HISTORY:  Vit D & B12 deficiency, osteopenia  PAIN:  Are you having pain? Yes: NPRS scale: moderate right now Pain location: Lt scapula Pain description: web,  spreading, tight Aggravating factors: fatigue Relieving factors: ice  PRECAUTIONS:  None  RED FLAGS: None   WEIGHT BEARING RESTRICTIONS:  No  FALLS:  Has patient fallen in last 6 months? No   OCCUPATION:  Retired  PLOF:  Independent  PATIENT GOALS:  Decrease pain, improve strength overall, back to myself and doing the things I want to do.    OBJECTIVE:  Note: Objective measures were completed at Evaluation unless otherwise noted.  PATIENT SURVEYS:  UEFS  Extreme difficulty/unable (0), Quite a bit of difficulty (1), Moderate difficulty (2), Little difficulty (3), No difficulty (4) Survey date:  03/20/24 04/14/24 06/13/24  Any of your usual work, household or school activities 1 4 3   2. Your usual hobbies, recreational/sport activities 0 2 1   3. Lifting a bag of groceries to waist level 0 1 0   4. Lifting a bag of groceries above your head 0 0 0  5. Grooming your hair 2 4 2   6. Pushing up on your hands (I.e. from bathtub or chair) 2 3 3   7. Preparing food (I.e. peeling/cutting) 2 4 4   8. Driving  4 4 4   9. Vacuuming, sweeping, or raking 1 2 2   10. Dressing  4 4 4   11. Doing up buttons 4 4 4   12. Using tools/appliances 2 3 3   13. Opening doors 4 4 4   14. Cleaning  4 4 3   15. Tying or lacing shoes 4 4 4   16. Sleeping  3 3 3   17. Laundering clothes (I.e. washing, ironing, folding) 4 4 4   18. Opening a jar 1 1 2   19. Throwing a ball 4 1 2   20. Carrying a small suitcase with your affected limb.  0 1 2  Score total:  46 57 54     COGNITIVE STATUS: Within functional limits for tasks assessed   POSTURE:  rounded shoulders  HAND DOMINANCE:  Right   Body Part #1 Shoulder  PALPATION: EVAL: tightness in infraspinatus, upper trap & scalene palpated. TTP upper trap, rhomboids, lats, subscap  UPPER EXTREMITY ROM:  MMT Right eval Left eval Rt/Lt 12/5  Shoulder flexion 8.1 5.6 7.1/5.2  Shoulder extension 17.8 12.2 11.5/12.3  Shoulder abduction 8.0 5.1    Shoulder adduction          Shoulder internal rotation 8.2 7.3   Shoulder external rotation 10.1 5.9 9.7/8.7  Elbow flexion 8.6 6.9 12.4/14  Elbow extension 6.3 8.2   Grip strength 15 15    (Blank rows = not tested) EVAL: repeated shoulder flexion- good scapular motion but shoulder hike notable after 3 reps and verbalized tightness into upper trap Negative cervical distraction & compression with equal ROM noted, only upper trap tightness on left with ipsilateral rotation  12/5: Biceps tested in seated- significantly weaker in standing, poor balance control in UE testing  limiting strength demonstration  TREATMENT DATE:   06/13/24 Muscle testing & discussion of function associated as well as balance input Goals discussion Importance of continued exercise and STM  05/24/24: See plan  04/14/24: Supine chest press- holding green band taught x10 Supine shoulder flexion- holding green band taught x10 Supine SLR with opp UE reach to foot 2x5 each side Nustep L2 UE & LE  04/08/24 Seated alternating arm raises Standing bil PNF x10 Ut stretching bil Seated marching 2# 2x10ea Seated LAQ 2# x15ea  Seated adductor squeeze  5 2x10 Seated fwd lumbar flexion stretch Discussion of diaphragmatic breathing  9/22 Supine PNF diagonals with 2lb weight- both UEs, significantly limited in reps on left Vs right Supine protraction 2lb, both hands holding weight & with left only holding weight, also without weight Supine ABC without weight Supine pec stretch Supine horiz abd red tband STM Lt periscapular and thoracic musculature    PATIENT EDUCATION:  Education details: Teacher, Music of condition, POC, HEP, exercise form/rationale Person educated: Patient Education method: Programmer, Multimedia, Demonstration, Tactile cues, Verbal cues, and Handouts Education comprehension: verbalized understanding, returned demonstration, verbal cues required, tactile cues required, and needs further  education   HOME EXERCISE PROGRAM: Short walks through day adding to 2 miles.  Access Code: [email or web address] URL: [email or web address] Date: 03/20/2024 Prepared by: Harlene Cordon     ASSESSMENT:  CLINICAL IMPRESSION: Full ROM demonstrated in upright position without pain. Has done well in progress with goals regarding shoulder. Seeing PCP next week and plans to obtain referral for balance and LBP. Discussed purchasing trekking poles for balance when walking.    OBJECTIVE IMPAIRMENTS: decreased activity tolerance, decreased endurance, decreased strength, increased muscle spasms, impaired UE functional use, improper body mechanics, postural dysfunction, and pain.   ACTIVITY LIMITATIONS: carrying, lifting, sleeping, and reach over head  PARTICIPATION LIMITATIONS: meal prep, cleaning, and laundry  PERSONAL FACTORS: Age, Time since onset of injury/illness/exacerbation, and 1 comorbidity: osteopenia are also affecting patient's functional outcome.   REHAB POTENTIAL: Good  CLINICAL DECISION MAKING: Evolving/moderate complexity  EVALUATION COMPLEXITY: Moderate   GOALS: Goals reviewed with patient? Yes  SHORT TERM GOALS: Target date: 9/27  Pt will have spoken to PCP regarding blood testing Baseline: Goal status: MET  2.  Independent in self-STM using tennis ball or something similar Baseline:  Goal status: MET & saw massage therapist    LONG TERM GOALS: Target date: POC date  UEFS to improve by MDC Baseline:  Goal status: INITIAL  2.  Lt UE MMT at least 75% of Rt Baseline: see obj Goal status: Not met  3.  Pt will verbalize feeling that she has the strength to use her Lt UE to assist in functional activities Baseline:  Goal status: MET  4.  Decrease pain by at least 50% in ADLs Baseline:  Goal status: MET regarding pain, fatigue is the limiting factor     PLAN:  PT FREQUENCY: 1-2x/week  PT DURATION: POC date  PLANNED INTERVENTIONS: 97164-  PT Re-evaluation, 97750- Physical Performance Testing, 97110-Therapeutic exercises, 97530- Therapeutic activity, [id number]- Neuromuscular re-education, 97535- Self Care, 02859- Manual therapy, [id number]- Aquatic Therapy, [phone] (1-2 muscles), 20561 (3+ muscles)- Dry Needling, Patient/Family education, Taping, Joint mobilization, Spinal mobilization, and Cryotherapy.  PLAN FOR NEXT SESSION: periscap stability with core engagement, how is walking going?   Ronnel Zuercher, PT, DPT 06/13/2024, 10:14 AM

## 2024-06-23 ENCOUNTER — Encounter (HOSPITAL_BASED_OUTPATIENT_CLINIC_OR_DEPARTMENT_OTHER): Payer: Self-pay | Admitting: Physical Therapy

## 2024-06-24 ENCOUNTER — Other Ambulatory Visit: Payer: Self-pay

## 2024-06-24 ENCOUNTER — Other Ambulatory Visit: Payer: Self-pay | Admitting: Orthopedic Surgery

## 2024-06-24 ENCOUNTER — Other Ambulatory Visit (HOSPITAL_BASED_OUTPATIENT_CLINIC_OR_DEPARTMENT_OTHER): Payer: Self-pay

## 2024-06-24 ENCOUNTER — Encounter: Payer: Self-pay | Admitting: Orthopedic Surgery

## 2024-06-24 MED ORDER — PREGABALIN 25 MG PO CAPS
25.0000 mg | ORAL_CAPSULE | Freq: Three times a day (TID) | ORAL | 1 refills | Status: AC
  Filled 2024-06-24: qty 90, 30d supply, fill #0
  Filled 2024-07-28: qty 90, 30d supply, fill #1

## 2024-07-08 ENCOUNTER — Encounter: Payer: Self-pay | Admitting: Orthopedic Surgery

## 2024-07-11 ENCOUNTER — Other Ambulatory Visit (HOSPITAL_BASED_OUTPATIENT_CLINIC_OR_DEPARTMENT_OTHER): Payer: Self-pay

## 2024-07-11 MED ORDER — METHYLPREDNISOLONE 4 MG PO TBPK
ORAL_TABLET | ORAL | 0 refills | Status: AC
  Filled 2024-07-11: qty 21, 6d supply, fill #0

## 2024-07-14 ENCOUNTER — Encounter (HOSPITAL_BASED_OUTPATIENT_CLINIC_OR_DEPARTMENT_OTHER): Payer: Self-pay | Admitting: Physical Therapy

## 2024-07-14 ENCOUNTER — Ambulatory Visit (HOSPITAL_BASED_OUTPATIENT_CLINIC_OR_DEPARTMENT_OTHER): Attending: Orthopaedic Surgery | Admitting: Physical Therapy

## 2024-07-14 ENCOUNTER — Other Ambulatory Visit: Payer: Self-pay

## 2024-07-14 DIAGNOSIS — M6281 Muscle weakness (generalized): Secondary | ICD-10-CM | POA: Diagnosis present

## 2024-07-14 DIAGNOSIS — M5459 Other low back pain: Secondary | ICD-10-CM | POA: Diagnosis present

## 2024-07-14 NOTE — Therapy (Signed)
 " OUTPATIENT PHYSICAL THERAPY EVALUATION   Patient Name: Vanessa Trujillo MRN: [id number] DOB:[birth year], 84 y.o., female Today's Date: 07/15/2024  END OF SESSION:  PT End of Session - 07/14/24 1535     Visit Number 1    Number of Visits 33    Date for Recertification  11/08/24    Authorization Type [employer]    PT Start Time 1534    PT Stop Time 1608    PT Time Calculation (min) 34 min    Activity Tolerance Patient tolerated treatment well    Behavior During Therapy WFL for tasks assessed/performed          Past Medical History:  Diagnosis Date   Allergic rhinitis    Aortic atherosclerosis    Chronic UTI    DDD (degenerative disc disease), lumbar    Diverticulosis of colon    GERD (gastroesophageal reflux disease)    Hematuria    Hiatal hernia    large per 06/05/23 CT A/P in Epic   History of adenomatous polyp of colon    History of hepatitis    age 29, hospitalized   History of kidney stones    Hyperlipidemia    results in epic;   NUC 06-18-2016  LR w/ nuclear ef 82%;  echo 06-18-2026 normal w/ ef 60-65%;  CTC 08-21-2018  calcium score= 0.9, LR   Macular degeneration of both eyes    OAB (overactive bladder)    Follows w/ [facility].   Osteopenia    PONV (postoperative nausea and vomiting)    TMJ (sprain of temporomandibular joint)    around 2018   Urge incontinence    Uterine fibroid    Vitamin B12 deficiency    Vitamin D  deficiency    Past Surgical History:  Procedure Laterality Date   APPENDECTOMY  1959   CATARACT EXTRACTION W/ INTRAOCULAR LENS IMPLANT Right 09/08/2015   Dr. Camillo   CHOLECYSTECTOMY  1989   COLONOSCOPY WITH PROPOFOL   04/08/2012   dr donnald   CYSTOSCOPY N/A 07/16/2023   Procedure: CYSTOSCOPY with hydrodistention and intravesical botox  injection (100 units);  Surgeon: Marilynne Rosaline SAILOR, MD;  Location: [facility];  Service: Gynecology;  Laterality: N/A;   DILATATION & CURRETTAGE/HYSTEROSCOPY WITH  RESECTOCOPE  04/12/1999   @ [facility] by dr fredrik sharps;    polypectomy   LITHOTRIPSY     TONSILLECTOMY AND ADENOIDECTOMY  1946   TUBAL LIGATION Bilateral 1979   Patient Active Problem List   Diagnosis Date Noted   Iron deficiency anemia, unspecified 05/08/2024   Medication monitoring encounter 08/09/2022   Overactive bladder 10/18/2021   Recurrent UTI 10/18/2021   Depression 06/17/2016   Gastroesophageal reflux disease without esophagitis    Chest pain at rest 06/16/2016   Cough 06/16/2016   Hyperglycemia 06/16/2016   Calculus of kidney 04/16/2013   Urge incontinence 04/16/2013   Urinary urgency 04/16/2013   Infection of urinary tract 04/16/2013    PCP:  Dwight Trula SQUIBB, MD      REFERRING PROVIDER:  Dwight Trula SQUIBB, MD      REFERRING DIAG: M54.59 (ICD-10-CM) - Other low back pain   Rationale for Evaluation and Treatment: Rehabilitation  THERAPY DIAG:  Muscle weakness (generalized)  Other low back pain  ONSET DATE: severe around 2023, acute on chronic onset this time   SUBJECTIVE:  SUBJECTIVE STATEMENT: My energy levels are much better. My back started hurting so bad. On day 3 of prednisone- felt so great in first two days. Each morning I do my exercises and lay on the heating pad in order to feel like I can move around. Went to my daughter's house and had to climb steps and it was terrible.  Got my trekking poles and walked with my daughter.   PERTINENT HISTORY:  Aortic atherosclerosis, Vit D & B 12 deficiency, iron deficiency anemia with recent iron infusions, hiatal hernia, osteopenia  PAIN:  Are you having pain? Yes: NPRS scale: mild to mod right now Pain location: lower back into Rt leg lateral to knee Pain description: like somebody poured cement over me in the AM Aggravating factors:  standing/walking, AM pain Relieving factors: stretches, exercises, prednisone  PRECAUTIONS:  Fall  RED FLAGS: None   WEIGHT BEARING RESTRICTIONS:  No  FALLS:  Has patient fallen in last 6 months? No   OCCUPATION:  Retired  PLOF:  Independent  PATIENT GOALS:  Improve balance, improve strength, decrease pain   OBJECTIVE:  Note: Objective measures were completed at Evaluation unless otherwise noted.  DIAGNOSTIC FINDINGS:  MRI 6.10.25: IMPRESSION: Mild retrolisthesis of L3 on L4 and moderate degenerative disc disease with mild endplate marrow edema.  Multilevel degenerative spondylosis with levels described in detail above.  No disc protrusion or evidence of impingement.  PATIENT SURVEYS:  Modified Oswestry:  MODIFIED OSWESTRY DISABILITY SCALE  Date: 07/14/24 Score  Pain intensity 4 =  Pain medication provides me with little relief from pain.  2. Personal care (washing, dressing, etc.) 2 =  It is painful to take care of myself, and I am slow and careful.  3. Lifting 4 = I can lift only very light weights  4. Walking 3 =  Pain prevents me from walking more than  mile.  5. Sitting 4 =  Pain prevents me from sitting more than 10 minutes.  6. Standing 4 =  Pain prevents me from standing more than 10 minutes.  7. Sleeping 1 = I can sleep well only by using pain medication.  8. Social Life 5 =  I have hardly any social life because of my pain.  9. Traveling 4 = My pain restricts my travel to short necessary journeys under 1/2 hour.  10. Employment/ Homemaking 5 = Pain prevents me from performing any job or homemaking chores.  Total 36/50   Interpretation of scores: Score Category Description  0-20% Minimal Disability The patient can cope with most living activities. Usually no treatment is indicated apart from advice on lifting, sitting and exercise  21-40% Moderate Disability The patient experiences more pain and difficulty with sitting, lifting and standing. Travel and  social life are more difficult and they may be disabled from work. Personal care, sexual activity and sleeping are not grossly affected, and the patient can usually be managed by conservative means  41-60% Severe Disability Pain remains the main problem in this group, but activities of daily living are affected. These patients require a detailed investigation  61-80% Crippled Back pain impinges on all aspects of the patients life. Positive intervention is required  81-100% Bed-bound These patients are either bed-bound or exaggerating their symptoms  Bluford FORBES Zoe DELENA Karon DELENA, et al. Surgery versus conservative management of stable thoracolumbar fracture: the PRESTO feasibility RCT. [place] ([place]): [employer]; 2021 Nov. [facility] Technology Assessment, No. 25.62.) Appendix 3, Oswestry Disability Index category descriptors. Available from: Findjewelers.cz  Minimally Clinically Important Difference (MCID) = 12.8%  COGNITIVE STATUS: Within functional limits for tasks assessed   SENSATION: WFL  POSTURE:  rounded shoulders   GAIT: Comments: utilizing trekking poles at eval, slow cadence with scissoring and not ambulating in a straight line   Body Part #1 Lumbar  Item Test date: 07/14/24 Date:  Date:   Sitting to standing 4. able to stand without using hands and stabilize independently Insert SmartPhrase OPRCBERGREEVAL Insert SmartPhrase OPRCBERGREEVAL  2. Standing unsupported 3. able to stand 2 minutes with supervision    3. Sitting with back unsupported, feet supported 4. able to sit safely and securely for 2 minutes    4. Standing to sitting 2. uses back of legs against chair to control descent    5. Pivot transfer  2. able to transfer with verbal cuing and/or supervision    6. Standing unsupported with eyes closed 3. able to stand 10 seconds with supervision    7. Standing unsupported with feet together 2. able to place feet together  independently but unable to hold for 30 seconds    8. Reaching forward with outstretched arms while standing 3. can reach forward 12 cm (5 inches)    9. Pick up object from the floor from standing 4. able to pick up slipper safely and easily    10. Turning to look behind over left and right shoulders while standing 4. looks behind from both sides and weight shifts well    11. Turn 360 degrees 2. able to turn 360 degrees safely but slowly    12. Place alternate foot on step or stool while standing unsupported 1. able to complete > 2 steps needs minimal assist    13. Standing unsupported one foot in front 2. able to take small step independently and hold 30 seconds    14. Standing on one leg 1. tries to lift leg unable to hold 3 seconds but remains standing independently.      Total Score 37/56 Total Score:    Total Score:      LOWER EXTREMITY MMT:   Eval: gross strength 4/5  MMT Right eval Left eval  Hip flexion    Hip extension    Hip abduction    Hip adduction    Hip internal rotation    Hip external rotation    Knee flexion    Knee extension    Ankle dorsiflexion    Ankle plantarflexion    Ankle inversion    Ankle eversion     (Blank rows = not tested)    FUNCTIONAL TESTS:  5 times sit to stand: 37s, used hands from thighs for reps 3 and 4                                                                                                                            TREATMENT DATE:   EVAL 07/14/24 Discussed use of trekking poles Discussed POC See HEP for exercises Functional  testing & how findings align with goals   PATIENT EDUCATION:  Education details: Anatomy of condition, POC, HEP, exercise form/rationale Person educated: Patient Education method: Explanation, Demonstration, Tactile cues, Verbal cues, and Handouts Education comprehension: verbalized understanding, returned demonstration, verbal cues required, tactile cues required, and needs further education  HOME  EXERCISE PROGRAM: Access Code: [email or web address] URL: [email or web address] Date: 07/14/2024 Prepared by: Harlene Cordon  Exercises - Supine Active Straight Leg Raise  - 2 x daily - 7 x weekly - 2 sets - 10 reps - Supine Bridge  - 2 x daily - 7 x weekly - 2 sets - 10 reps - Sidelying Hip Abduction  - 2 x daily - 7 x weekly - 2 sets - 10 reps - Standing Heel Raises  - 2 x daily - 7 x weekly - 2 sets - 10 reps - Tandem Stance with Support  - 2 x daily - 7 x weekly - 2 sets - 30s hold - Sit to Stand Without Arm Support  - 5 x daily - 7 x weekly - 2 sets - 5 reps   ASSESSMENT:  CLINICAL IMPRESSION: Patient is a 84 y.o. F who was seen today for physical therapy evaluation and treatment for LBP of subacute on chronic nature.  Patient has been seen in the past for similar back pain that improved significantly.  Since that time patient experienced other medical issues that restricted physical function and resulted in loss of strength.  This loss of strength has ultimately resulted in very poor balance and biomechanical chain control which is contributing to her back pain.  Patient will benefit from skilled physical therapy in order to improve strength, improve balance, and meet functional goals.   OBJECTIVE IMPAIRMENTS: Abnormal gait, decreased activity tolerance, decreased balance, decreased endurance, difficulty walking, decreased strength, increased muscle spasms, and pain.   ACTIVITY LIMITATIONS: carrying, lifting, bending, sitting, standing, squatting, stairs, transfers, dressing, locomotion level, and caring for others  PARTICIPATION LIMITATIONS: meal prep, cleaning, laundry, shopping, community activity, and yard work  PERSONAL FACTORS: 3+ comorbidities: osteopenia, poor balance/fall risk, anemia are also affecting patient's functional outcome.   REHAB POTENTIAL: Good  CLINICAL DECISION MAKING: Evolving/moderate complexity  EVALUATION COMPLEXITY: Moderate   GOALS: Goals  reviewed with patient? Yes  SHORT TERM GOALS: Target date: 08/09/24  Determine appropriateness of continuing aquatic treatments considering UTI risk Baseline: Goal status: INITIAL  2.  Patient will verbalize compliance with performing exercises multiple times throughout the day Baseline:  Goal status: INITIAL  3.  Patient will verbalize comfort of walking with trekking poles Baseline:  Goal status: INITIAL  4.  The patient will tolerate exercise intensity as that has been established with general muscle soreness but without increase in pain Baseline:  Goal status: INITIAL    LONG TERM GOALS: Target date: Plan of care date  Berg to improve by MDC Baseline:  Goal status: INITIAL  2.  Able to navigate stairs ascending and descending with single upper extremity use for only balance and safety Baseline:  Goal status: INITIAL  3.  Patient will return to walking her dogs rather than requiring a hired assistant Baseline:  Goal status: INITIAL  4.  Patient will be able to get out of bed and get dressed within 1 hour of being up rather than pain limiting function Baseline:  Goal status: INITIAL  5.  Patient will be independent in long-term fitness program Baseline:  Goal status: INITIAL    PLAN:  PT FREQUENCY: 1-2x/week  PT DURATION: POC  date  PLANNED INTERVENTIONS: 97164- PT Re-evaluation, 97750- Physical Performance Testing, 97110-Therapeutic exercises, 97530- Therapeutic activity, [id number]- Neuromuscular re-education, [phone]- Self Care, 02859- Manual therapy, [phone]- Gait training, [phone]- Aquatic Therapy, [phone] (1-2 muscles), [phone] (3+ muscles)- Dry Needling, Patient/Family education, Balance training, Stair training, Taping, Joint mobilization, Spinal mobilization, Cryotherapy, and Moist heat.  PLAN FOR NEXT SESSION: begin aquatics as schedule allows, gross strength   Harlene Cordon, PT, DPT 07/15/2024, 12:27 PM   Date of referral: 06/20/24 Referring provider:   Dwight Trula SQUIBB, MD     Referring diagnosis? M54.59 (ICD-10-CM) - Other low back pain  Treatment diagnosis? (if different than referring diagnosis) M62.81 M54.59  What was this (referring dx) caused by? Ongoing Issue  Lysle of Condition: Chronic (continuous duration > 3 months)   Laterality: Both  Current Functional Measure Score: Other BERG (see obj)  Objective measurements identify impairments when they are compared to normal values, the uninvolved extremity, and prior level of function.  [x]  Yes  []  No  Objective assessment of functional ability: Moderate functional limitations   Briefly describe symptoms: LBP with symptoms to lateral right thigh, poor balance, fatigue and weakness.   How did symptoms start: Insidious onset, subacute on chronic.   Average pain intensity:  Last 24 hours: 6/10  Past week: 6-7/10  How often does the pt experience symptoms? Constantly  How much have the symptoms interfered with usual daily activities? Extremely  How has condition changed since care began at this facility? NA - initial visit  In general, how is the patients overall health? Good   BACK PAIN (STarT Back Screening Tool) Has pain spread down the leg(s) at some time in the last 2 weeks? yes Has there been pain in the shoulder or neck at some time in the last 2 weeks? no Has the pt only walked short distances because of back pain? yes Has patient dressed more slowly because of back pain in the past 2 weeks? yes Does patient think it's not safe for a person with this condition to be physically active? No, she thinks it is safe Does patient have worrying thoughts a lot of the time? no Does patient feel back pain is terrible and will never get any better? no Has patient stopped enjoying things they usually enjoy? yes Overall, how bothersome has back pain been in the last 2 weeks?                    Extreme   "

## 2024-07-21 ENCOUNTER — Encounter: Payer: Self-pay | Admitting: *Deleted

## 2024-07-21 ENCOUNTER — Ambulatory Visit (HOSPITAL_BASED_OUTPATIENT_CLINIC_OR_DEPARTMENT_OTHER): Admitting: Physical Therapy

## 2024-07-21 ENCOUNTER — Other Ambulatory Visit (HOSPITAL_BASED_OUTPATIENT_CLINIC_OR_DEPARTMENT_OTHER): Payer: Self-pay

## 2024-07-21 ENCOUNTER — Encounter (HOSPITAL_BASED_OUTPATIENT_CLINIC_OR_DEPARTMENT_OTHER): Payer: Self-pay | Admitting: Physical Therapy

## 2024-07-21 DIAGNOSIS — M6281 Muscle weakness (generalized): Secondary | ICD-10-CM

## 2024-07-21 DIAGNOSIS — M5459 Other low back pain: Secondary | ICD-10-CM

## 2024-07-21 NOTE — Therapy (Signed)
 " OUTPATIENT PHYSICAL THERAPY EVALUATION   Patient Name: MONIE SHERE MRN: [id number] DOB:02/07/1941, 84 y.o., female Today's Date: 07/21/2024  END OF SESSION:  PT End of Session - 07/21/24 1624     Visit Number 2    Number of Visits 33    Date for Recertification  11/08/24    Authorization Type [employer]    PT Start Time 1532    PT Stop Time 1615    PT Time Calculation (min) 43 min    Activity Tolerance Patient tolerated treatment well    Behavior During Therapy WFL for tasks assessed/performed           Past Medical History:  Diagnosis Date   Allergic rhinitis    Aortic atherosclerosis    Chronic UTI    DDD (degenerative disc disease), lumbar    Diverticulosis of colon    GERD (gastroesophageal reflux disease)    Hematuria    Hiatal hernia    large per 06/05/23 CT A/P in Epic   History of adenomatous polyp of colon    History of hepatitis    age 22, hospitalized   History of kidney stones    Hyperlipidemia    results in epic;   NUC 06-18-2016  LR w/ nuclear ef 82%;  echo 06-18-2026 normal w/ ef 60-65%;  CTC 08-21-2018  calcium score= 0.9, LR   Macular degeneration of both eyes    OAB (overactive bladder)    Follows w/ [facility].   Osteopenia    PONV (postoperative nausea and vomiting)    TMJ (sprain of temporomandibular joint)    around 2018   Urge incontinence    Uterine fibroid    Vitamin B12 deficiency    Vitamin D  deficiency    Past Surgical History:  Procedure Laterality Date   APPENDECTOMY  1959   CATARACT EXTRACTION W/ INTRAOCULAR LENS IMPLANT Right 09/08/2015   Dr. Camillo   CHOLECYSTECTOMY  1989   COLONOSCOPY WITH PROPOFOL   04/08/2012   dr donnald   CYSTOSCOPY N/A 07/16/2023   Procedure: CYSTOSCOPY with hydrodistention and intravesical botox  injection (100 units);  Surgeon: Marilynne Rosaline SAILOR, MD;  Location: [facility];  Service: Gynecology;  Laterality: N/A;   DILATATION & CURRETTAGE/HYSTEROSCOPY  WITH RESECTOCOPE  04/12/1999   @ [facility] by dr fredrik sharps;    polypectomy   LITHOTRIPSY     TONSILLECTOMY AND ADENOIDECTOMY  1946   TUBAL LIGATION Bilateral 1979   Patient Active Problem List   Diagnosis Date Noted   Iron deficiency anemia, unspecified 05/08/2024   Medication monitoring encounter 08/09/2022   Overactive bladder 10/18/2021   Recurrent UTI 10/18/2021   Depression 06/17/2016   Gastroesophageal reflux disease without esophagitis    Chest pain at rest 06/16/2016   Cough 06/16/2016   Hyperglycemia 06/16/2016   Calculus of kidney 04/16/2013   Urge incontinence 04/16/2013   Urinary urgency 04/16/2013   Infection of urinary tract 04/16/2013    PCP:  Dwight Trula SQUIBB, MD      REFERRING PROVIDER:  Dwight Trula SQUIBB, MD      REFERRING DIAG: M54.59 (ICD-10-CM) - Other low back pain   Rationale for Evaluation and Treatment: Rehabilitation  THERAPY DIAG:  Muscle weakness (generalized)  Other low back pain  ONSET DATE: severe around 2023, acute on chronic onset this time   SUBJECTIVE:  SUBJECTIVE STATEMENT: My energy levels are much better. My back started hurting so bad. On day 3 of prednisone- felt so great in first two days. Each morning I do my exercises and lay on the heating pad in order to feel like I can move around. Went to my daughter's house and had to climb steps and it was terrible.  Got my trekking poles and walked with my daughter.   PERTINENT HISTORY:  Aortic atherosclerosis, Vit D & B 12 deficiency, iron deficiency anemia with recent iron infusions, hiatal hernia, osteopenia  PAIN:  Are you having pain? Yes: NPRS scale: mild to mod right now Pain location: lower back into Rt leg lateral to knee Pain description: like somebody poured cement over me in the AM Aggravating  factors: standing/walking, AM pain Relieving factors: stretches, exercises, prednisone  PRECAUTIONS:  Fall  RED FLAGS: None   WEIGHT BEARING RESTRICTIONS:  No  FALLS:  Has patient fallen in last 6 months? No   OCCUPATION:  Retired  PLOF:  Independent  PATIENT GOALS:  Improve balance, improve strength, decrease pain   OBJECTIVE:  Note: Objective measures were completed at Evaluation unless otherwise noted.  DIAGNOSTIC FINDINGS:  MRI 6.10.25: IMPRESSION: Mild retrolisthesis of L3 on L4 and moderate degenerative disc disease with mild endplate marrow edema.  Multilevel degenerative spondylosis with levels described in detail above.  No disc protrusion or evidence of impingement.  PATIENT SURVEYS:  Modified Oswestry:  MODIFIED OSWESTRY DISABILITY SCALE  Date: 07/14/24 Score  Pain intensity 4 =  Pain medication provides me with little relief from pain.  2. Personal care (washing, dressing, etc.) 2 =  It is painful to take care of myself, and I am slow and careful.  3. Lifting 4 = I can lift only very light weights  4. Walking 3 =  Pain prevents me from walking more than  mile.  5. Sitting 4 =  Pain prevents me from sitting more than 10 minutes.  6. Standing 4 =  Pain prevents me from standing more than 10 minutes.  7. Sleeping 1 = I can sleep well only by using pain medication.  8. Social Life 5 =  I have hardly any social life because of my pain.  9. Traveling 4 = My pain restricts my travel to short necessary journeys under 1/2 hour.  10. Employment/ Homemaking 5 = Pain prevents me from performing any job or homemaking chores.  Total 36/50   Interpretation of scores: Score Category Description  0-20% Minimal Disability The patient can cope with most living activities. Usually no treatment is indicated apart from advice on lifting, sitting and exercise  21-40% Moderate Disability The patient experiences more pain and difficulty with sitting, lifting and standing.  Travel and social life are more difficult and they may be disabled from work. Personal care, sexual activity and sleeping are not grossly affected, and the patient can usually be managed by conservative means  41-60% Severe Disability Pain remains the main problem in this group, but activities of daily living are affected. These patients require a detailed investigation  61-80% Crippled Back pain impinges on all aspects of the patients life. Positive intervention is required  81-100% Bed-bound These patients are either bed-bound or exaggerating their symptoms  Bluford FORBES Zoe DELENA Karon DELENA, et al. Surgery versus conservative management of stable thoracolumbar fracture: the PRESTO feasibility RCT. [place] ([place]): [employer]; 2021 Nov. [facility] Technology Assessment, No. 25.62.) Appendix 3, Oswestry Disability Index category descriptors. Available from: Findjewelers.cz  Minimally Clinically Important Difference (MCID) = 12.8%  COGNITIVE STATUS: Within functional limits for tasks assessed   SENSATION: WFL  POSTURE:  rounded shoulders   GAIT: Comments: utilizing trekking poles at eval, slow cadence with scissoring and not ambulating in a straight line   Body Part #1 Lumbar  Item Test date: 07/14/24 Date:  Date:   Sitting to standing 4. able to stand without using hands and stabilize independently Insert SmartPhrase OPRCBERGREEVAL Insert SmartPhrase OPRCBERGREEVAL  2. Standing unsupported 3. able to stand 2 minutes with supervision    3. Sitting with back unsupported, feet supported 4. able to sit safely and securely for 2 minutes    4. Standing to sitting 2. uses back of legs against chair to control descent    5. Pivot transfer  2. able to transfer with verbal cuing and/or supervision    6. Standing unsupported with eyes closed 3. able to stand 10 seconds with supervision    7. Standing unsupported with feet together 2. able to place feet together  independently but unable to hold for 30 seconds    8. Reaching forward with outstretched arms while standing 3. can reach forward 12 cm (5 inches)    9. Pick up object from the floor from standing 4. able to pick up slipper safely and easily    10. Turning to look behind over left and right shoulders while standing 4. looks behind from both sides and weight shifts well    11. Turn 360 degrees 2. able to turn 360 degrees safely but slowly    12. Place alternate foot on step or stool while standing unsupported 1. able to complete > 2 steps needs minimal assist    13. Standing unsupported one foot in front 2. able to take small step independently and hold 30 seconds    14. Standing on one leg 1. tries to lift leg unable to hold 3 seconds but remains standing independently.      Total Score 37/56 Total Score:    Total Score:      LOWER EXTREMITY MMT:   Eval: gross strength 4/5  MMT Right eval Left eval  Hip flexion    Hip extension    Hip abduction    Hip adduction    Hip internal rotation    Hip external rotation    Knee flexion    Knee extension    Ankle dorsiflexion    Ankle plantarflexion    Ankle inversion    Ankle eversion     (Blank rows = not tested)    FUNCTIONAL TESTS:  5 times sit to stand: 37s, used hands from thighs for reps 3 and 4                                                                                                                            TREATMENT DATE:   07/21/24 Nu step L3 UE & LE STM Lt lumbar/Rt thoracic paraspinals Prone upper  body lift with forehead on hands Prone alt UE/LE superman Supine ab set + bridge with ball bw knees LTR DKTC with alt toe touches Reverse crunch  EVAL 07/14/24 Discussed use of trekking poles Discussed POC See HEP for exercises Functional testing & how findings align with goals   PATIENT EDUCATION:  Education details: Anatomy of condition, POC, HEP, exercise form/rationale Person educated: Patient Education  method: Explanation, Demonstration, Tactile cues, Verbal cues, and Handouts Education comprehension: verbalized understanding, returned demonstration, verbal cues required, tactile cues required, and needs further education  HOME EXERCISE PROGRAM: Access Code: [email or web address] URL: [email or web address] Date: 07/14/2024 Prepared by: Harlene Cordon  Exercises - Supine Active Straight Leg Raise  - 2 x daily - 7 x weekly - 2 sets - 10 reps - Supine Bridge  - 2 x daily - 7 x weekly - 2 sets - 10 reps - Sidelying Hip Abduction  - 2 x daily - 7 x weekly - 2 sets - 10 reps - Standing Heel Raises  - 2 x daily - 7 x weekly - 2 sets - 10 reps - Tandem Stance with Support  - 2 x daily - 7 x weekly - 2 sets - 30s hold - Sit to Stand Without Arm Support  - 5 x daily - 7 x weekly - 2 sets - 5 reps   ASSESSMENT:  CLINICAL IMPRESSION: Significant pain found in trigger points of Lt lumbar/Rt thoracic paraspinals and limited Lt SIJ mobility. Pt was unable to perform a prone trunk lift from the table due to lack of thoracic strength and is avoiding hip hinge due to pain. Was able to perform a sit to stand with proper hiip hinge after cuing and practice with dowel. Pt is considering injection but as her symptoms presented today did not align with disk or nerve issues, I think we should work to build some functional strength and endurance before seeking more injections.    OBJECTIVE IMPAIRMENTS: Abnormal gait, decreased activity tolerance, decreased balance, decreased endurance, difficulty walking, decreased strength, increased muscle spasms, and pain.   ACTIVITY LIMITATIONS: carrying, lifting, bending, sitting, standing, squatting, stairs, transfers, dressing, locomotion level, and caring for others  PARTICIPATION LIMITATIONS: meal prep, cleaning, laundry, shopping, community activity, and yard work  PERSONAL FACTORS: 3+ comorbidities: osteopenia, poor balance/fall risk, anemia are also affecting  patient's functional outcome.   REHAB POTENTIAL: Good  CLINICAL DECISION MAKING: Evolving/moderate complexity  EVALUATION COMPLEXITY: Moderate   GOALS: Goals reviewed with patient? Yes  SHORT TERM GOALS: Target date: 08/09/24  Determine appropriateness of continuing aquatic treatments considering UTI risk Baseline: Goal status: INITIAL  2.  Patient will verbalize compliance with performing exercises multiple times throughout the day Baseline:  Goal status: INITIAL  3.  Patient will verbalize comfort of walking with trekking poles Baseline:  Goal status: INITIAL  4.  The patient will tolerate exercise intensity as that has been established with general muscle soreness but without increase in pain Baseline:  Goal status: INITIAL    LONG TERM GOALS: Target date: Plan of care date  Berg to improve by MDC Baseline:  Goal status: INITIAL  2.  Able to navigate stairs ascending and descending with single upper extremity use for only balance and safety Baseline:  Goal status: INITIAL  3.  Patient will return to walking her dogs rather than requiring a hired assistant Baseline:  Goal status: INITIAL  4.  Patient will be able to get out of bed and get dressed within 1 hour of being up  rather than pain limiting function Baseline:  Goal status: INITIAL  5.  Patient will be independent in long-term fitness program Baseline:  Goal status: INITIAL    PLAN:  PT FREQUENCY: 1-2x/week  PT DURATION: POC date  PLANNED INTERVENTIONS: 97164- PT Re-evaluation, 97750- Physical Performance Testing, 97110-Therapeutic exercises, 97530- Therapeutic activity, [id number]- Neuromuscular re-education, 97535- Self Care, [id number]- Manual therapy, [phone]- Gait training, [phone]- Aquatic Therapy, [phone] (1-2 muscles), [id number] (3+ muscles)- Dry Needling, Patient/Family education, Balance training, Stair training, Taping, Joint mobilization, Spinal mobilization, Cryotherapy, and Moist heat.  PLAN FOR NEXT  SESSION: begin aquatics as schedule allows, gross strength   Harlene Cordon, PT, DPT 07/21/2024, 4:25 PM   Date of referral: 06/20/24 Referring provider:  Dwight Trula SQUIBB, MD     Referring diagnosis? M54.59 (ICD-10-CM) - Other low back pain  Treatment diagnosis? (if different than referring diagnosis) M62.81 M54.59  What was this (referring dx) caused by? Ongoing Issue  Lysle of Condition: Chronic (continuous duration > 3 months)   Laterality: Both  Current Functional Measure Score: Other BERG (see obj)  Objective measurements identify impairments when they are compared to normal values, the uninvolved extremity, and prior level of function.  [x]  Yes  []  No  Objective assessment of functional ability: Moderate functional limitations   Briefly describe symptoms: LBP with symptoms to lateral right thigh, poor balance, fatigue and weakness.   How did symptoms start: Insidious onset, subacute on chronic.   Average pain intensity:  Last 24 hours: 6/10  Past week: 6-7/10  How often does the pt experience symptoms? Constantly  How much have the symptoms interfered with usual daily activities? Extremely  How has condition changed since care began at this facility? NA - initial visit  In general, how is the patients overall health? Good   BACK PAIN (STarT Back Screening Tool) Has pain spread down the leg(s) at some time in the last 2 weeks? yes Has there been pain in the shoulder or neck at some time in the last 2 weeks? no Has the pt only walked short distances because of back pain? yes Has patient dressed more slowly because of back pain in the past 2 weeks? yes Does patient think it's not safe for a person with this condition to be physically active? No, she thinks it is safe Does patient have worrying thoughts a lot of the time? no Does patient feel back pain is terrible and will never get any better? no Has patient stopped enjoying things they usually enjoy?  yes Overall, how bothersome has back pain been in the last 2 weeks?                    Extreme   "

## 2024-07-25 ENCOUNTER — Encounter (HOSPITAL_BASED_OUTPATIENT_CLINIC_OR_DEPARTMENT_OTHER): Payer: Self-pay

## 2024-07-25 ENCOUNTER — Ambulatory Visit (HOSPITAL_BASED_OUTPATIENT_CLINIC_OR_DEPARTMENT_OTHER)

## 2024-07-25 DIAGNOSIS — M6281 Muscle weakness (generalized): Secondary | ICD-10-CM

## 2024-07-25 DIAGNOSIS — M5459 Other low back pain: Secondary | ICD-10-CM

## 2024-07-25 NOTE — Therapy (Signed)
 " OUTPATIENT PHYSICAL THERAPY Treatement   Patient Name: Vanessa Trujillo MRN: [id number] DOB:06-04-1941, 84 y.o., female Today's Date: 07/25/2024  END OF SESSION:  PT End of Session - 07/25/24 1352     Visit Number 3    Number of Visits 33    Date for Recertification  11/08/24    Authorization Type [employer]    Progress Note Due on Visit 10    PT Start Time 1301    PT Stop Time 1344    PT Time Calculation (min) 43 min    Activity Tolerance Patient tolerated treatment well    Behavior During Therapy WFL for tasks assessed/performed            Past Medical History:  Diagnosis Date   Allergic rhinitis    Aortic atherosclerosis    Chronic UTI    DDD (degenerative disc disease), lumbar    Diverticulosis of colon    GERD (gastroesophageal reflux disease)    Hematuria    Hiatal hernia    large per 06/05/23 CT A/P in Epic   History of adenomatous polyp of colon    History of hepatitis    age 65, hospitalized   History of kidney stones    Hyperlipidemia    results in epic;   NUC 06-18-2016  LR w/ nuclear ef 82%;  echo 06-18-2026 normal w/ ef 60-65%;  CTC 08-21-2018  calcium score= 0.9, LR   Macular degeneration of both eyes    OAB (overactive bladder)    Follows w/ [facility].   Osteopenia    PONV (postoperative nausea and vomiting)    TMJ (sprain of temporomandibular joint)    around 2018   Urge incontinence    Uterine fibroid    Vitamin B12 deficiency    Vitamin D  deficiency    Past Surgical History:  Procedure Laterality Date   APPENDECTOMY  1959   CATARACT EXTRACTION W/ INTRAOCULAR LENS IMPLANT Right 09/08/2015   Dr. Camillo   CHOLECYSTECTOMY  1989   COLONOSCOPY WITH PROPOFOL   04/08/2012   dr donnald   CYSTOSCOPY N/A 07/16/2023   Procedure: CYSTOSCOPY with hydrodistention and intravesical botox  injection (100 units);  Surgeon: Marilynne Rosaline SAILOR, MD;  Location: [facility];  Service: Gynecology;  Laterality: N/A;    DILATATION & CURRETTAGE/HYSTEROSCOPY WITH RESECTOCOPE  04/12/1999   @ [facility] by dr fredrik sharps;    polypectomy   LITHOTRIPSY     TONSILLECTOMY AND ADENOIDECTOMY  1946   TUBAL LIGATION Bilateral 1979   Patient Active Problem List   Diagnosis Date Noted   Iron deficiency anemia, unspecified 05/08/2024   Medication monitoring encounter 08/09/2022   Overactive bladder 10/18/2021   Recurrent UTI 10/18/2021   Depression 06/17/2016   Gastroesophageal reflux disease without esophagitis    Chest pain at rest 06/16/2016   Cough 06/16/2016   Hyperglycemia 06/16/2016   Calculus of kidney 04/16/2013   Urge incontinence 04/16/2013   Urinary urgency 04/16/2013   Infection of urinary tract 04/16/2013    PCP:  Dwight Trula SQUIBB, MD      REFERRING PROVIDER:  Dwight Trula SQUIBB, MD      REFERRING DIAG: M54.59 (ICD-10-CM) - Other low back pain   Rationale for Evaluation and Treatment: Rehabilitation  THERAPY DIAG:  Muscle weakness (generalized)  Other low back pain  ONSET DATE: severe around 2023, acute on chronic onset this time   SUBJECTIVE:  SUBJECTIVE STATEMENT: Pt reports she woke up with increased pain on R side form leg, hip, and back even up to  to her neck and head. Pt reports she has to give a eulogy tomorrow and needs to be able to stand for that.   PERTINENT HISTORY:  Aortic atherosclerosis, Vit D & B 12 deficiency, iron deficiency anemia with recent iron infusions, hiatal hernia, osteopenia  PAIN:  Are you having pain? Yes: NPRS scale: 7/10 Pain location: lower back into Rt leg lateral to knee Pain description: like somebody poured cement over me in the AM Aggravating factors: standing/walking, AM pain Relieving factors: stretches, exercises, prednisone  PRECAUTIONS:  Fall  RED  FLAGS: None   WEIGHT BEARING RESTRICTIONS:  No  FALLS:  Has patient fallen in last 6 months? No   OCCUPATION:  Retired  PLOF:  Independent  PATIENT GOALS:  Improve balance, improve strength, decrease pain   OBJECTIVE:  Note: Objective measures were completed at Evaluation unless otherwise noted.  DIAGNOSTIC FINDINGS:  MRI 6.10.25: IMPRESSION: Mild retrolisthesis of L3 on L4 and moderate degenerative disc disease with mild endplate marrow edema.  Multilevel degenerative spondylosis with levels described in detail above.  No disc protrusion or evidence of impingement.  PATIENT SURVEYS:  Modified Oswestry:  MODIFIED OSWESTRY DISABILITY SCALE  Date: 07/14/24 Score  Pain intensity 4 =  Pain medication provides me with little relief from pain.  2. Personal care (washing, dressing, etc.) 2 =  It is painful to take care of myself, and I am slow and careful.  3. Lifting 4 = I can lift only very light weights  4. Walking 3 =  Pain prevents me from walking more than  mile.  5. Sitting 4 =  Pain prevents me from sitting more than 10 minutes.  6. Standing 4 =  Pain prevents me from standing more than 10 minutes.  7. Sleeping 1 = I can sleep well only by using pain medication.  8. Social Life 5 =  I have hardly any social life because of my pain.  9. Traveling 4 = My pain restricts my travel to short necessary journeys under 1/2 hour.  10. Employment/ Homemaking 5 = Pain prevents me from performing any job or homemaking chores.  Total 36/50   Interpretation of scores: Score Category Description  0-20% Minimal Disability The patient can cope with most living activities. Usually no treatment is indicated apart from advice on lifting, sitting and exercise  21-40% Moderate Disability The patient experiences more pain and difficulty with sitting, lifting and standing. Travel and social life are more difficult and they may be disabled from work. Personal care, sexual activity and sleeping  are not grossly affected, and the patient can usually be managed by conservative means  41-60% Severe Disability Pain remains the main problem in this group, but activities of daily living are affected. These patients require a detailed investigation  61-80% Crippled Back pain impinges on all aspects of the patients life. Positive intervention is required  81-100% Bed-bound These patients are either bed-bound or exaggerating their symptoms  Bluford FORBES Zoe DELENA Karon DELENA, et al. Surgery versus conservative management of stable thoracolumbar fracture: the PRESTO feasibility RCT. [place] ([place]): [employer]; 2021 Nov. [facility] Technology Assessment, No. 25.62.) Appendix 3, Oswestry Disability Index category descriptors. Available from: [email or web address].cz  Minimally Clinically Important Difference (MCID) = 12.8%  COGNITIVE STATUS: Within functional limits for tasks assessed   SENSATION: WFL  POSTURE:  rounded shoulders   GAIT: Comments:  utilizing trekking poles at eval, slow cadence with scissoring and not ambulating in a straight line   Body Part #1 Lumbar  Item Test date: 07/14/24 Date:  Date:   Sitting to standing 4. able to stand without using hands and stabilize independently Insert SmartPhrase OPRCBERGREEVAL Insert SmartPhrase OPRCBERGREEVAL  2. Standing unsupported 3. able to stand 2 minutes with supervision    3. Sitting with back unsupported, feet supported 4. able to sit safely and securely for 2 minutes    4. Standing to sitting 2. uses back of legs against chair to control descent    5. Pivot transfer  2. able to transfer with verbal cuing and/or supervision    6. Standing unsupported with eyes closed 3. able to stand 10 seconds with supervision    7. Standing unsupported with feet together 2. able to place feet together independently but unable to hold for 30 seconds    8. Reaching forward with outstretched arms while standing 3. can  reach forward 12 cm (5 inches)    9. Pick up object from the floor from standing 4. able to pick up slipper safely and easily    10. Turning to look behind over left and right shoulders while standing 4. looks behind from both sides and weight shifts well    11. Turn 360 degrees 2. able to turn 360 degrees safely but slowly    12. Place alternate foot on step or stool while standing unsupported 1. able to complete > 2 steps needs minimal assist    13. Standing unsupported one foot in front 2. able to take small step independently and hold 30 seconds    14. Standing on one leg 1. tries to lift leg unable to hold 3 seconds but remains standing independently.      Total Score 37/56 Total Score:    Total Score:      LOWER EXTREMITY MMT:   Eval: gross strength 4/5  MMT Right eval Left eval  Hip flexion    Hip extension    Hip abduction    Hip adduction    Hip internal rotation    Hip external rotation    Knee flexion    Knee extension    Ankle dorsiflexion    Ankle plantarflexion    Ankle inversion    Ankle eversion     (Blank rows = not tested)    FUNCTIONAL TESTS:  5 times sit to stand: 37s, used hands from thighs for reps 3 and 4                                                                                                                            TREATMENT DATE:   1/16: LTR HS stretching Piriformis stretching Review of HEP UT stretching bilaterally Shoulder rolls UE elevation stretching for lats Seated lumbar side bending stretch Standing marching slow with cues for TrA- UE support on rail Seated ball rolls outs on plinth for lumbar  stretch -fwd and to L side x10 Educated on breathing     07/21/24 Nu step L3 UE & LE STM Lt lumbar/Rt thoracic paraspinals Prone upper body lift with forehead on hands Prone alt UE/LE superman Supine ab set + bridge with ball bw knees LTR DKTC with alt toe touches Reverse crunch  EVAL 07/14/24 Discussed use of trekking  poles Discussed POC See HEP for exercises Functional testing & how findings align with goals   PATIENT EDUCATION:  Education details: Anatomy of condition, POC, HEP, exercise form/rationale Person educated: Patient Education method: Explanation, Demonstration, Tactile cues, Verbal cues, and Handouts Education comprehension: verbalized understanding, returned demonstration, verbal cues required, tactile cues required, and needs further education  HOME EXERCISE PROGRAM: Access Code: [email or web address] URL: [email or web address] Date: 07/14/2024 Prepared by: Harlene Cordon  Exercises - Supine Active Straight Leg Raise  - 2 x daily - 7 x weekly - 2 sets - 10 reps - Supine Bridge  - 2 x daily - 7 x weekly - 2 sets - 10 reps - Sidelying Hip Abduction  - 2 x daily - 7 x weekly - 2 sets - 10 reps - Standing Heel Raises  - 2 x daily - 7 x weekly - 2 sets - 10 reps - Tandem Stance with Support  - 2 x daily - 7 x weekly - 2 sets - 30s hold - Sit to Stand Without Arm Support  - 5 x daily - 7 x weekly - 2 sets - 5 reps   ASSESSMENT:  CLINICAL IMPRESSION: Worked on gentle mobility interventions. Pt required cues during exercises to avoid pushing past pain limitations. She had pain with most tasks today. Pt also tends to hold breath with activity and was cued to breathe during each exercise. Discussed strategies for symptom management at home including exercise, stretching, heat, and self massage. She was able to tolerate very gentle interventions with cuing for breathing and avoiding pushing past pain limitations. Will continue to progress as tolerated while monitoring pain level.    OBJECTIVE IMPAIRMENTS: Abnormal gait, decreased activity tolerance, decreased balance, decreased endurance, difficulty walking, decreased strength, increased muscle spasms, and pain.   ACTIVITY LIMITATIONS: carrying, lifting, bending, sitting, standing, squatting, stairs, transfers, dressing, locomotion  level, and caring for others  PARTICIPATION LIMITATIONS: meal prep, cleaning, laundry, shopping, community activity, and yard work  PERSONAL FACTORS: 3+ comorbidities: osteopenia, poor balance/fall risk, anemia are also affecting patient's functional outcome.   REHAB POTENTIAL: Good  CLINICAL DECISION MAKING: Evolving/moderate complexity  EVALUATION COMPLEXITY: Moderate   GOALS: Goals reviewed with patient? Yes  SHORT TERM GOALS: Target date: 08/09/24  Determine appropriateness of continuing aquatic treatments considering UTI risk Baseline: Goal status: INITIAL  2.  Patient will verbalize compliance with performing exercises multiple times throughout the day Baseline:  Goal status: INITIAL  3.  Patient will verbalize comfort of walking with trekking poles Baseline:  Goal status: INITIAL  4.  The patient will tolerate exercise intensity as that has been established with general muscle soreness but without increase in pain Baseline:  Goal status: INITIAL    LONG TERM GOALS: Target date: Plan of care date  Berg to improve by MDC Baseline:  Goal status: INITIAL  2.  Able to navigate stairs ascending and descending with single upper extremity use for only balance and safety Baseline:  Goal status: INITIAL  3.  Patient will return to walking her dogs rather than requiring a hired assistant Baseline:  Goal status: INITIAL  4.  Patient will be  able to get out of bed and get dressed within 1 hour of being up rather than pain limiting function Baseline:  Goal status: INITIAL  5.  Patient will be independent in long-term fitness program Baseline:  Goal status: INITIAL    PLAN:  PT FREQUENCY: 1-2x/week  PT DURATION: POC date  PLANNED INTERVENTIONS: 97164- PT Re-evaluation, 97750- Physical Performance Testing, 97110-Therapeutic exercises, 97530- Therapeutic activity, [id number]- Neuromuscular re-education, 97535- Self Care, [id number]- Manual therapy, [phone]- Gait training,  [phone]- Aquatic Therapy, [phone] (1-2 muscles), [id number] (3+ muscles)- Dry Needling, Patient/Family education, Balance training, Stair training, Taping, Joint mobilization, Spinal mobilization, Cryotherapy, and Moist heat.  PLAN FOR NEXT SESSION: begin aquatics as schedule allows, gross strength   Asberry FORBES Rodes, PTA 07/25/2024, 2:29 PM    Date of referral: 06/20/24 Referring provider:  Dwight Trula SQUIBB, MD     Referring diagnosis? M54.59 (ICD-10-CM) - Other low back pain  Treatment diagnosis? (if different than referring diagnosis) M62.81 M54.59  What was this (referring dx) caused by? Ongoing Issue  Lysle of Condition: Chronic (continuous duration > 3 months)   Laterality: Both  Current Functional Measure Score: Other BERG (see obj)  Objective measurements identify impairments when they are compared to normal values, the uninvolved extremity, and prior level of function.  [x]  Yes  []  No  Objective assessment of functional ability: Moderate functional limitations   Briefly describe symptoms: LBP with symptoms to lateral right thigh, poor balance, fatigue and weakness.   How did symptoms start: Insidious onset, subacute on chronic.   Average pain intensity:  Last 24 hours: 6/10  Past week: 6-7/10  How often does the pt experience symptoms? Constantly  How much have the symptoms interfered with usual daily activities? Extremely  How has condition changed since care began at this facility? NA - initial visit  In general, how is the patients overall health? Good   BACK PAIN (STarT Back Screening Tool) Has pain spread down the leg(s) at some time in the last 2 weeks? yes Has there been pain in the shoulder or neck at some time in the last 2 weeks? no Has the pt only walked short distances because of back pain? yes Has patient dressed more slowly because of back pain in the past 2 weeks? yes Does patient think it's not safe for a person with this condition to be physically  active? No, she thinks it is safe Does patient have worrying thoughts a lot of the time? no Does patient feel back pain is terrible and will never get any better? no Has patient stopped enjoying things they usually enjoy? yes Overall, how bothersome has back pain been in the last 2 weeks?                    Extreme   "

## 2024-07-28 ENCOUNTER — Ambulatory Visit (HOSPITAL_BASED_OUTPATIENT_CLINIC_OR_DEPARTMENT_OTHER): Admitting: Physical Therapy

## 2024-07-28 ENCOUNTER — Other Ambulatory Visit: Payer: Self-pay

## 2024-07-28 ENCOUNTER — Encounter (HOSPITAL_BASED_OUTPATIENT_CLINIC_OR_DEPARTMENT_OTHER): Payer: Self-pay | Admitting: Physical Therapy

## 2024-07-28 ENCOUNTER — Other Ambulatory Visit (HOSPITAL_BASED_OUTPATIENT_CLINIC_OR_DEPARTMENT_OTHER): Payer: Self-pay

## 2024-07-28 DIAGNOSIS — M5459 Other low back pain: Secondary | ICD-10-CM

## 2024-07-28 DIAGNOSIS — M6281 Muscle weakness (generalized): Secondary | ICD-10-CM

## 2024-07-28 NOTE — Therapy (Signed)
 " OUTPATIENT PHYSICAL THERAPY Treatement   Patient Name: MICHIAH MASSE MRN: [id number] DOB:05/15/1941, 84 y.o., female Today's Date: 07/28/2024  END OF SESSION:  PT End of Session - 07/28/24 1321     Visit Number 4    Number of Visits 33    Date for Recertification  11/08/24    Authorization Type [employer]    PT Start Time 1322    PT Stop Time 1400    PT Time Calculation (min) 38 min    Activity Tolerance Patient tolerated treatment well    Behavior During Therapy WFL for tasks assessed/performed            Past Medical History:  Diagnosis Date   Allergic rhinitis    Aortic atherosclerosis    Chronic UTI    DDD (degenerative disc disease), lumbar    Diverticulosis of colon    GERD (gastroesophageal reflux disease)    Hematuria    Hiatal hernia    large per 06/05/23 CT A/P in Epic   History of adenomatous polyp of colon    History of hepatitis    age 21, hospitalized   History of kidney stones    Hyperlipidemia    results in epic;   NUC 06-18-2016  LR w/ nuclear ef 82%;  echo 06-18-2026 normal w/ ef 60-65%;  CTC 08-21-2018  calcium score= 0.9, LR   Macular degeneration of both eyes    OAB (overactive bladder)    Follows w/ [facility].   Osteopenia    PONV (postoperative nausea and vomiting)    TMJ (sprain of temporomandibular joint)    around 2018   Urge incontinence    Uterine fibroid    Vitamin B12 deficiency    Vitamin D  deficiency    Past Surgical History:  Procedure Laterality Date   APPENDECTOMY  1959   CATARACT EXTRACTION W/ INTRAOCULAR LENS IMPLANT Right 09/08/2015   Dr. Camillo   CHOLECYSTECTOMY  1989   COLONOSCOPY WITH PROPOFOL   04/08/2012   dr donnald   CYSTOSCOPY N/A 07/16/2023   Procedure: CYSTOSCOPY with hydrodistention and intravesical botox  injection (100 units);  Surgeon: Marilynne Rosaline SAILOR, MD;  Location: [facility];  Service: Gynecology;  Laterality: N/A;   DILATATION & CURRETTAGE/HYSTEROSCOPY  WITH RESECTOCOPE  04/12/1999   @ [facility] by dr fredrik sharps;    polypectomy   LITHOTRIPSY     TONSILLECTOMY AND ADENOIDECTOMY  1946   TUBAL LIGATION Bilateral 1979   Patient Active Problem List   Diagnosis Date Noted   Iron deficiency anemia, unspecified 05/08/2024   Medication monitoring encounter 08/09/2022   Overactive bladder 10/18/2021   Recurrent UTI 10/18/2021   Depression 06/17/2016   Gastroesophageal reflux disease without esophagitis    Chest pain at rest 06/16/2016   Cough 06/16/2016   Hyperglycemia 06/16/2016   Calculus of kidney 04/16/2013   Urge incontinence 04/16/2013   Urinary urgency 04/16/2013   Infection of urinary tract 04/16/2013    PCP:  Dwight Trula SQUIBB, MD      REFERRING PROVIDER:  Dwight Trula SQUIBB, MD      REFERRING DIAG: M54.59 (ICD-10-CM) - Other low back pain   Rationale for Evaluation and Treatment: Rehabilitation  THERAPY DIAG:  Other low back pain  Muscle weakness (generalized)  ONSET DATE: severe around 2023, acute on chronic onset this time   SUBJECTIVE:  SUBJECTIVE STATEMENT: Pt made it to the euology and was able to give it. Pt states that they've been using a cane because they feel their balance is really decreased. Back pain has gotten better since last visit. Has been doing her HEP in the morning before getting up. Mentioned their subscap gets really bad and inflamed but isn't a problem right now. Pt states they are coming here for massages and those have been helping.    PERTINENT HISTORY:  Aortic atherosclerosis, Vit D & B 12 deficiency, iron deficiency anemia with recent iron infusions, hiatal hernia, osteopenia  PAIN:  Are you having pain? Yes: NPRS scale: 7/10 Pain location: lower back into Rt leg lateral to knee Pain description: like somebody poured  cement over me in the AM Aggravating factors: standing/walking, AM pain Relieving factors: stretches, exercises, prednisone  PRECAUTIONS:  Fall  RED FLAGS: None   WEIGHT BEARING RESTRICTIONS:  No  FALLS:  Has patient fallen in last 6 months? No   OCCUPATION:  Retired  PLOF:  Independent  PATIENT GOALS:  Improve balance, improve strength, decrease pain   OBJECTIVE:  Note: Objective measures were completed at Evaluation unless otherwise noted.  DIAGNOSTIC FINDINGS:  MRI 6.10.25: IMPRESSION: Mild retrolisthesis of L3 on L4 and moderate degenerative disc disease with mild endplate marrow edema.  Multilevel degenerative spondylosis with levels described in detail above.  No disc protrusion or evidence of impingement.  PATIENT SURVEYS:  Modified Oswestry:  MODIFIED OSWESTRY DISABILITY SCALE  Date: 07/14/24 Score  Pain intensity 4 =  Pain medication provides me with little relief from pain.  2. Personal care (washing, dressing, etc.) 2 =  It is painful to take care of myself, and I am slow and careful.  3. Lifting 4 = I can lift only very light weights  4. Walking 3 =  Pain prevents me from walking more than  mile.  5. Sitting 4 =  Pain prevents me from sitting more than 10 minutes.  6. Standing 4 =  Pain prevents me from standing more than 10 minutes.  7. Sleeping 1 = I can sleep well only by using pain medication.  8. Social Life 5 =  I have hardly any social life because of my pain.  9. Traveling 4 = My pain restricts my travel to short necessary journeys under 1/2 hour.  10. Employment/ Homemaking 5 = Pain prevents me from performing any job or homemaking chores.  Total 36/50   Interpretation of scores: Score Category Description  0-20% Minimal Disability The patient can cope with most living activities. Usually no treatment is indicated apart from advice on lifting, sitting and exercise  21-40% Moderate Disability The patient experiences more pain and difficulty  with sitting, lifting and standing. Travel and social life are more difficult and they may be disabled from work. Personal care, sexual activity and sleeping are not grossly affected, and the patient can usually be managed by conservative means  41-60% Severe Disability Pain remains the main problem in this group, but activities of daily living are affected. These patients require a detailed investigation  61-80% Crippled Back pain impinges on all aspects of the patients life. Positive intervention is required  81-100% Bed-bound These patients are either bed-bound or exaggerating their symptoms  Bluford FORBES Zoe DELENA Karon DELENA, et al. Surgery versus conservative management of stable thoracolumbar fracture: the PRESTO feasibility RCT. [place] ([place]): [employer]; 2021 Nov. [facility] Technology Assessment, No. 25.62.) Appendix 3, Oswestry Disability Index category descriptors. Available  from: Findjewelers.cz  Minimally Clinically Important Difference (MCID) = 12.8%  COGNITIVE STATUS: Within functional limits for tasks assessed   SENSATION: WFL  POSTURE:  rounded shoulders   GAIT: Comments: utilizing trekking poles at eval, slow cadence with scissoring and not ambulating in a straight line   Body Part #1 Lumbar  Item Test date: 07/14/24 Date:  Date:   Sitting to standing 4. able to stand without using hands and stabilize independently Insert SmartPhrase OPRCBERGREEVAL Insert SmartPhrase OPRCBERGREEVAL  2. Standing unsupported 3. able to stand 2 minutes with supervision    3. Sitting with back unsupported, feet supported 4. able to sit safely and securely for 2 minutes    4. Standing to sitting 2. uses back of legs against chair to control descent    5. Pivot transfer  2. able to transfer with verbal cuing and/or supervision    6. Standing unsupported with eyes closed 3. able to stand 10 seconds with supervision    7. Standing unsupported with feet  together 2. able to place feet together independently but unable to hold for 30 seconds    8. Reaching forward with outstretched arms while standing 3. can reach forward 12 cm (5 inches)    9. Pick up object from the floor from standing 4. able to pick up slipper safely and easily    10. Turning to look behind over left and right shoulders while standing 4. looks behind from both sides and weight shifts well    11. Turn 360 degrees 2. able to turn 360 degrees safely but slowly    12. Place alternate foot on step or stool while standing unsupported 1. able to complete > 2 steps needs minimal assist    13. Standing unsupported one foot in front 2. able to take small step independently and hold 30 seconds    14. Standing on one leg 1. tries to lift leg unable to hold 3 seconds but remains standing independently.      Total Score 37/56 Total Score:    Total Score:      LOWER EXTREMITY MMT:   Eval: gross strength 4/5  MMT Right eval Left eval  Hip flexion    Hip extension    Hip abduction    Hip adduction    Hip internal rotation    Hip external rotation    Knee flexion    Knee extension    Ankle dorsiflexion    Ankle plantarflexion    Ankle inversion    Ankle eversion     (Blank rows = not tested)    FUNCTIONAL TESTS:  5 times sit to stand: 37s, used hands from thighs for reps 3 and 4                                                                                                                            TREATMENT DATE:  1/19 Manual: STM to lower back and pt ed on self-STM using  LEVA  Pt-ed: Reviewed HEP and RPE with pt   There-ex: Resisted ER 1x12 RTB, 2x12 YTB Seated march 1x12  1/16: LTR HS stretching Piriformis stretching Review of HEP UT stretching bilaterally Shoulder rolls UE elevation stretching for lats Seated lumbar side bending stretch Standing marching slow with cues for TrA- UE support on rail Seated ball rolls outs on plinth for lumbar stretch -fwd  and to L side x10 Educated on breathing     07/21/24 Nu step L3 UE & LE STM Lt lumbar/Rt thoracic paraspinals Prone upper body lift with forehead on hands Prone alt UE/LE superman Supine ab set + bridge with ball bw knees LTR DKTC with alt toe touches Reverse crunch  EVAL 07/14/24 Discussed use of trekking poles Discussed POC See HEP for exercises Functional testing & how findings align with goals   PATIENT EDUCATION:  Education details: Anatomy of condition, POC, HEP, exercise form/rationale Person educated: Patient Education method: Explanation, Demonstration, Tactile cues, Verbal cues, and Handouts Education comprehension: verbalized understanding, returned demonstration, verbal cues required, tactile cues required, and needs further education  HOME EXERCISE PROGRAM: Access Code: [email or web address] URL: [email or web address] Date: 07/14/2024 Prepared by: Harlene Cordon  Exercises - Supine Active Straight Leg Raise  - 2 x daily - 7 x weekly - 2 sets - 10 reps - Supine Bridge  - 2 x daily - 7 x weekly - 2 sets - 10 reps - Sidelying Hip Abduction  - 2 x daily - 7 x weekly - 2 sets - 10 reps - Standing Heel Raises  - 2 x daily - 7 x weekly - 2 sets - 10 reps - Tandem Stance with Support  - 2 x daily - 7 x weekly - 2 sets - 30s hold - Sit to Stand Without Arm Support  - 5 x daily - 7 x weekly - 2 sets - 5 reps   ASSESSMENT:  CLINICAL IMPRESSION: Pt came in today with less pain. STM revealed trigger points and PT focused on loosening those up. PT provided pt ed on self STM, grading exercise, HEP, and calm down versus long term exercises that will help relieve back pain. PT advised pt to let their back direct them through exercises and grading them. Pt tolerated exercise well and needed minimal cueing on TrA breathing. Pt listened to their back when it was starting to flare and stopped exercise accordingly. Overall, pt will benefit from continued PT and pt ed on  activity modifications and navigating LBP.    OBJECTIVE IMPAIRMENTS: Abnormal gait, decreased activity tolerance, decreased balance, decreased endurance, difficulty walking, decreased strength, increased muscle spasms, and pain.   ACTIVITY LIMITATIONS: carrying, lifting, bending, sitting, standing, squatting, stairs, transfers, dressing, locomotion level, and caring for others  PARTICIPATION LIMITATIONS: meal prep, cleaning, laundry, shopping, community activity, and yard work  PERSONAL FACTORS: 3+ comorbidities: osteopenia, poor balance/fall risk, anemia are also affecting patient's functional outcome.   REHAB POTENTIAL: Good  CLINICAL DECISION MAKING: Evolving/moderate complexity  EVALUATION COMPLEXITY: Moderate   GOALS: Goals reviewed with patient? Yes  SHORT TERM GOALS: Target date: 08/09/24  Determine appropriateness of continuing aquatic treatments considering UTI risk Baseline: Goal status: INITIAL  2.  Patient will verbalize compliance with performing exercises multiple times throughout the day Baseline:  Goal status: INITIAL  3.  Patient will verbalize comfort of walking with trekking poles Baseline:  Goal status: INITIAL  4.  The patient will tolerate exercise intensity as that has been established with general muscle soreness but without increase in  pain Baseline:  Goal status: INITIAL    LONG TERM GOALS: Target date: Plan of care date  Berg to improve by MDC Baseline:  Goal status: INITIAL  2.  Able to navigate stairs ascending and descending with single upper extremity use for only balance and safety Baseline:  Goal status: INITIAL  3.  Patient will return to walking her dogs rather than requiring a hired assistant Baseline:  Goal status: INITIAL  4.  Patient will be able to get out of bed and get dressed within 1 hour of being up rather than pain limiting function Baseline:  Goal status: INITIAL  5.  Patient will be independent in long-term  fitness program Baseline:  Goal status: INITIAL    PLAN:  PT FREQUENCY: 1-2x/week  PT DURATION: POC date  PLANNED INTERVENTIONS: 97164- PT Re-evaluation, 97750- Physical Performance Testing, 97110-Therapeutic exercises, 97530- Therapeutic activity, [id number]- Neuromuscular re-education, 97535- Self Care, 02859- Manual therapy, [id number]- Gait training, [phone]- Aquatic Therapy, [phone] (1-2 muscles), 20561 (3+ muscles)- Dry Needling, Patient/Family education, Balance training, Stair training, Taping, Joint mobilization, Spinal mobilization, Cryotherapy, and Moist heat.  PLAN FOR NEXT SESSION: begin aquatics as schedule allows, gross strength   Nyoka Pearson, Student-PT 07/28/2024, 1:23 PM    Date of referral: 06/20/24 Referring provider:  Dwight Trula SQUIBB, MD     Referring diagnosis? M54.59 (ICD-10-CM) - Other low back pain  Treatment diagnosis? (if different than referring diagnosis) M62.81 M54.59  What was this (referring dx) caused by? Ongoing Issue  Lysle of Condition: Chronic (continuous duration > 3 months)   Laterality: Both  Current Functional Measure Score: Other BERG (see obj)  Objective measurements identify impairments when they are compared to normal values, the uninvolved extremity, and prior level of function.  [x]  Yes  []  No  Objective assessment of functional ability: Moderate functional limitations   Briefly describe symptoms: LBP with symptoms to lateral right thigh, poor balance, fatigue and weakness.   How did symptoms start: Insidious onset, subacute on chronic.   Average pain intensity:  Last 24 hours: 6/10  Past week: 6-7/10  How often does the pt experience symptoms? Constantly  How much have the symptoms interfered with usual daily activities? Extremely  How has condition changed since care began at this facility? NA - initial visit  In general, how is the patients overall health? Good   BACK PAIN (STarT Back Screening Tool) Has pain spread  down the leg(s) at some time in the last 2 weeks? yes Has there been pain in the shoulder or neck at some time in the last 2 weeks? no Has the pt only walked short distances because of back pain? yes Has patient dressed more slowly because of back pain in the past 2 weeks? yes Does patient think it's not safe for a person with this condition to be physically active? No, she thinks it is safe Does patient have worrying thoughts a lot of the time? no Does patient feel back pain is terrible and will never get any better? no Has patient stopped enjoying things they usually enjoy? yes Overall, how bothersome has back pain been in the last 2 weeks?                    Extreme   "

## 2024-07-29 ENCOUNTER — Encounter (HOSPITAL_BASED_OUTPATIENT_CLINIC_OR_DEPARTMENT_OTHER): Payer: Self-pay | Admitting: Physical Therapy

## 2024-08-05 ENCOUNTER — Other Ambulatory Visit (HOSPITAL_BASED_OUTPATIENT_CLINIC_OR_DEPARTMENT_OTHER): Payer: Self-pay

## 2024-08-06 ENCOUNTER — Ambulatory Visit (HOSPITAL_BASED_OUTPATIENT_CLINIC_OR_DEPARTMENT_OTHER): Admitting: Physical Therapy

## 2024-08-08 ENCOUNTER — Telehealth: Admitting: Obstetrics and Gynecology

## 2024-08-08 ENCOUNTER — Other Ambulatory Visit (HOSPITAL_BASED_OUTPATIENT_CLINIC_OR_DEPARTMENT_OTHER): Payer: Self-pay

## 2024-08-08 DIAGNOSIS — N3281 Overactive bladder: Secondary | ICD-10-CM | POA: Diagnosis not present

## 2024-08-08 DIAGNOSIS — Z87440 Personal history of urinary (tract) infections: Secondary | ICD-10-CM | POA: Diagnosis not present

## 2024-08-08 DIAGNOSIS — N390 Urinary tract infection, site not specified: Secondary | ICD-10-CM

## 2024-08-08 MED ORDER — SULFAMETHOXAZOLE-TRIMETHOPRIM 400-80 MG PO TABS
0.5000 | ORAL_TABLET | Freq: Every day | ORAL | 5 refills | Status: AC
  Filled 2024-08-08: qty 15, 30d supply, fill #0

## 2024-08-08 NOTE — Progress Notes (Signed)
 [facility] Return Visit- video  Patient location- at home, [place] Provider location- [place] office Pt was the only person on the call and consented to video visit.   SUBJECTIVE  History of Present Illness: Vanessa Trujillo is a 84 y.o. female seen in follow-up for OAB, prolapse and recurrent UTI.  Still having a lot of back pain symptoms. She is about to start aquatic therapy. Since she has so much going on with her back, does not want to attempt SNM trial again at this time. She is interested in trying botox  again with higher dose.   She has been doing well on bactrim  prophylaxis and estrogen cream and has not had any UTIs.   Prior therapies: pelvic PT, myrbetriq , gemtesa , tolterodine, trospium , PTNS (at [facility]), PNE trial for SNM in Dec 2024, Cystoscopy with hydrodistention and intravesical botox  injection (100 units) in Jan 2025, botox  x3 at [facility].   Past Medical History: Patient  has a past medical history of Allergic rhinitis, Aortic atherosclerosis, Chronic UTI, DDD (degenerative disc disease), lumbar, Diverticulosis of colon, GERD (gastroesophageal reflux disease), Hematuria, Hiatal hernia, History of adenomatous polyp of colon, History of hepatitis, History of kidney stones, Hyperlipidemia, Macular degeneration of both eyes, OAB (overactive bladder), Osteopenia, PONV (postoperative nausea and vomiting), TMJ (sprain of temporomandibular joint), Urge incontinence, Uterine fibroid, Vitamin B12 deficiency, and Vitamin D  deficiency.   Past Surgical History: She  has a past surgical history that includes Tonsillectomy and adenoidectomy (1946); Appendectomy (1959); Tubal ligation (Bilateral, 1979); Cholecystectomy (1989); Lithotripsy; Cataract extraction w/ intraocular lens implant (Right, 09/08/2015); Cystoscopy (N/A, 07/16/2023); Dilatation & currettage/hysteroscopy with resectoscope (04/12/1999); and Colonoscopy with propofol  (04/08/2012).   Medications: She  has a current medication list which includes the following prescription(s): vitamin c , dulcolax, citalopram , estradiol , repatha  sureclick, methylprednisolone , multiple vitamins-minerals, NONFORMULARY OR COMPOUNDED ITEM, omeprazole , pregabalin , align, sulfamethoxazole -trimethoprim , and vitamin b-12.   Allergies: Patient is allergic to codeine, atorvastatin, pravastatin, rosuvastatin calcium, and zoster vac recomb adjuvanted.   Social History: Patient  reports that she has never smoked. She has never used smokeless tobacco. She reports that she does not drink alcohol and does not use drugs.     OBJECTIVE     Physical Exam: There were no vitals filed for this visit.  Gen: No apparent distress, A&O x 3.     ASSESSMENT AND PLAN    Vanessa Trujillo is a 84 y.o. with:  1. Overactive bladder   2. Recurrent UTI    - Will plan for 200 units of botox  in the OR for OAB.  We reviewed the benefits and risks of each treatment option. We discussed risks of bleeding, infection, damage to surrounding organs including bowel, bladder, blood vessels, ureters and nerves, need for further surgery, numbness and weakness at any body site.  - For recurrent UTI, continue with bactrim  ppx x 6 more months and estrace  cream twice weekly.    [name], MD

## 2024-08-12 ENCOUNTER — Encounter (HOSPITAL_BASED_OUTPATIENT_CLINIC_OR_DEPARTMENT_OTHER): Payer: Self-pay | Admitting: Physical Therapy

## 2024-08-12 ENCOUNTER — Ambulatory Visit (HOSPITAL_BASED_OUTPATIENT_CLINIC_OR_DEPARTMENT_OTHER): Payer: Self-pay | Attending: Orthopaedic Surgery | Admitting: Physical Therapy

## 2024-08-12 DIAGNOSIS — R293 Abnormal posture: Secondary | ICD-10-CM

## 2024-08-12 DIAGNOSIS — M5459 Other low back pain: Secondary | ICD-10-CM

## 2024-08-12 DIAGNOSIS — M6281 Muscle weakness (generalized): Secondary | ICD-10-CM

## 2024-08-13 ENCOUNTER — Other Ambulatory Visit (HOSPITAL_BASED_OUTPATIENT_CLINIC_OR_DEPARTMENT_OTHER): Payer: Self-pay

## 2024-08-14 ENCOUNTER — Encounter (HOSPITAL_BASED_OUTPATIENT_CLINIC_OR_DEPARTMENT_OTHER): Payer: Self-pay | Admitting: Physical Therapy

## 2024-08-14 ENCOUNTER — Ambulatory Visit (HOSPITAL_BASED_OUTPATIENT_CLINIC_OR_DEPARTMENT_OTHER): Payer: Self-pay | Admitting: Physical Therapy

## 2024-08-14 DIAGNOSIS — R293 Abnormal posture: Secondary | ICD-10-CM

## 2024-08-14 DIAGNOSIS — M5459 Other low back pain: Secondary | ICD-10-CM

## 2024-08-14 DIAGNOSIS — M6281 Muscle weakness (generalized): Secondary | ICD-10-CM

## 2024-08-14 NOTE — Therapy (Signed)
 " OUTPATIENT PHYSICAL THERAPY TREATMENT   Patient Name: Vanessa Trujillo MRN: [id number] DOB:[birth year], 84 y.o., female Today's Date: 08/14/2024  END OF SESSION:  PT End of Session - 08/14/24 1349     Visit Number 6    Number of Visits 33    Date for Recertification  11/08/24    Authorization Type [employer]    Authorization Time Period 16 visits: 07/14/24-09/08/24    Authorization - Visit Number 6    Authorization - Number of Visits 16    Progress Note Due on Visit 10    PT Start Time 1347    PT Stop Time 1426    PT Time Calculation (min) 39 min    Activity Tolerance Patient tolerated treatment well    Behavior During Therapy WFL for tasks assessed/performed            Past Medical History:  Diagnosis Date   Allergic rhinitis    Aortic atherosclerosis    Chronic UTI    DDD (degenerative disc disease), lumbar    Diverticulosis of colon    GERD (gastroesophageal reflux disease)    Hematuria    Hiatal hernia    large per 06/05/23 CT A/P in Epic   History of adenomatous polyp of colon    History of hepatitis    age 63, hospitalized   History of kidney stones    Hyperlipidemia    results in epic;   NUC 06-18-2016  LR w/ nuclear ef 82%;  echo 06-18-2026 normal w/ ef 60-65%;  CTC 08-21-2018  calcium score= 0.9, LR   Macular degeneration of both eyes    OAB (overactive bladder)    Follows w/ [facility].   Osteopenia    PONV (postoperative nausea and vomiting)    TMJ (sprain of temporomandibular joint)    around 2018   Urge incontinence    Uterine fibroid    Vitamin B12 deficiency    Vitamin D  deficiency    Past Surgical History:  Procedure Laterality Date   APPENDECTOMY  1959   CATARACT EXTRACTION W/ INTRAOCULAR LENS IMPLANT Right 09/08/2015   Dr. Camillo   CHOLECYSTECTOMY  1989   COLONOSCOPY WITH PROPOFOL   04/08/2012   dr donnald   CYSTOSCOPY N/A 07/16/2023   Procedure: CYSTOSCOPY with hydrodistention and intravesical botox  injection (100  units);  Surgeon: Marilynne Rosaline SAILOR, MD;  Location: [facility];  Service: Gynecology;  Laterality: N/A;   DILATATION & CURRETTAGE/HYSTEROSCOPY WITH RESECTOCOPE  04/12/1999   @ [facility] by dr fredrik sharps;    polypectomy   LITHOTRIPSY     TONSILLECTOMY AND ADENOIDECTOMY  1946   TUBAL LIGATION Bilateral 1979   Patient Active Problem List   Diagnosis Date Noted   Iron deficiency anemia, unspecified 05/08/2024   Medication monitoring encounter 08/09/2022   Overactive bladder 10/18/2021   Recurrent UTI 10/18/2021   Depression 06/17/2016   Gastroesophageal reflux disease without esophagitis    Chest pain at rest 06/16/2016   Cough 06/16/2016   Hyperglycemia 06/16/2016   Calculus of kidney 04/16/2013   Urge incontinence 04/16/2013   Urinary urgency 04/16/2013   Infection of urinary tract 04/16/2013    PCP:  Dwight Trula SQUIBB, MD      REFERRING PROVIDER:  Dwight Trula SQUIBB, MD      REFERRING DIAG: M54.59 (ICD-10-CM) - Other low back pain   Rationale for Evaluation and Treatment: Rehabilitation  THERAPY DIAG:  Other low back pain  Muscle weakness (generalized)  Abnormal posture  ONSET DATE: severe around 2023, acute on chronic onset this time   SUBJECTIVE:                                                                                                                                                                                           SUBJECTIVE STATEMENT: Pt reports she has not done aquatic therapy but did water  aerobics in the past.  She is not afraid of water . Hasn't been in pool in about 4 years.   PERTINENT HISTORY:  Aortic atherosclerosis, Vit D & B 12 deficiency, iron deficiency anemia with recent iron infusions, hiatal hernia, osteopenia  PAIN:  Are you having pain? Yes: NPRS scale: 6-7/10 Pain location: lower back into bil hips Pain description: ache, sore Aggravating factors: standing/walking, AM pain Relieving factors: stretches, exercises,  prednisone  PRECAUTIONS:  Fall  RED FLAGS: None   WEIGHT BEARING RESTRICTIONS:  No  FALLS:  Has patient fallen in last 6 months? No   OCCUPATION:  Retired  PLOF:  Independent  PATIENT GOALS:  Improve balance, improve strength, decrease pain   OBJECTIVE:  Note: Objective measures were completed at Evaluation unless otherwise noted.  DIAGNOSTIC FINDINGS:  MRI 6.10.25: IMPRESSION: Mild retrolisthesis of L3 on L4 and moderate degenerative disc disease with mild endplate marrow edema.  Multilevel degenerative spondylosis with levels described in detail above.  No disc protrusion or evidence of impingement.  PATIENT SURVEYS:  Modified Oswestry:  MODIFIED OSWESTRY DISABILITY SCALE  Date: 07/14/24 Score  Pain intensity 4 =  Pain medication provides me with little relief from pain.  2. Personal care (washing, dressing, etc.) 2 =  It is painful to take care of myself, and I am slow and careful.  3. Lifting 4 = I can lift only very light weights  4. Walking 3 =  Pain prevents me from walking more than  mile.  5. Sitting 4 =  Pain prevents me from sitting more than 10 minutes.  6. Standing 4 =  Pain prevents me from standing more than 10 minutes.  7. Sleeping 1 = I can sleep well only by using pain medication.  8. Social Life 5 =  I have hardly any social life because of my pain.  9. Traveling 4 = My pain restricts my travel to short necessary journeys under 1/2 hour.  10. Employment/ Homemaking 5 = Pain prevents me from performing any job or homemaking chores.  Total 36/50   Interpretation of scores: Score Category Description  0-20% Minimal Disability The patient can cope with most living activities. Usually no treatment is indicated apart from advice on lifting, sitting and exercise  21-40% Moderate Disability The patient experiences more pain and difficulty with sitting, lifting and standing. Travel and social life are more difficult and they may be disabled from work.  Personal care, sexual activity and sleeping are not grossly affected, and the patient can usually be managed by conservative means  41-60% Severe Disability Pain remains the main problem in this group, but activities of daily living are affected. These patients require a detailed investigation  61-80% Crippled Back pain impinges on all aspects of the patients life. Positive intervention is required  81-100% Bed-bound These patients are either bed-bound or exaggerating their symptoms  Bluford FORBES Zoe DELENA Karon DELENA, et al. Surgery versus conservative management of stable thoracolumbar fracture: the PRESTO feasibility RCT. [place] ([place]): [employer]; 2021 Nov. [facility] Technology Assessment, No. 25.62.) Appendix 3, Oswestry Disability Index category descriptors. Available from: Findjewelers.cz  Minimally Clinically Important Difference (MCID) = 12.8%  COGNITIVE STATUS: Within functional limits for tasks assessed   SENSATION: WFL  POSTURE:  rounded shoulders   GAIT: Comments: utilizing trekking poles at eval, slow cadence with scissoring and not ambulating in a straight line   Body Part #1 Lumbar  Item Test date: 07/14/24 Date:  Date:   Sitting to standing 4. able to stand without using hands and stabilize independently Insert SmartPhrase OPRCBERGREEVAL Insert SmartPhrase OPRCBERGREEVAL  2. Standing unsupported 3. able to stand 2 minutes with supervision    3. Sitting with back unsupported, feet supported 4. able to sit safely and securely for 2 minutes    4. Standing to sitting 2. uses back of legs against chair to control descent    5. Pivot transfer  2. able to transfer with verbal cuing and/or supervision    6. Standing unsupported with eyes closed 3. able to stand 10 seconds with supervision    7. Standing unsupported with feet together 2. able to place feet together independently but unable to hold for 30 seconds    8. Reaching forward with  outstretched arms while standing 3. can reach forward 12 cm (5 inches)    9. Pick up object from the floor from standing 4. able to pick up slipper safely and easily    10. Turning to look behind over left and right shoulders while standing 4. looks behind from both sides and weight shifts well    11. Turn 360 degrees 2. able to turn 360 degrees safely but slowly    12. Place alternate foot on step or stool while standing unsupported 1. able to complete > 2 steps needs minimal assist    13. Standing unsupported one foot in front 2. able to take small step independently and hold 30 seconds    14. Standing on one leg 1. tries to lift leg unable to hold 3 seconds but remains standing independently.      Total Score 37/56 Total Score:    Total Score:      LOWER EXTREMITY MMT:   Eval: gross strength 4/5  MMT Right eval Left eval  Hip flexion    Hip extension    Hip abduction    Hip adduction    Hip internal rotation    Hip external rotation    Knee flexion    Knee extension    Ankle dorsiflexion    Ankle plantarflexion    Ankle inversion    Ankle eversion     (Blank rows = not tested)    FUNCTIONAL TESTS:  5 times sit to stand: 37s, used hands  from thighs for reps 3 and 4 5 times sit to stand: 24s without UE use                                                                                                                             TREATMENT DATE:   08/14/24 Suitcase squat to shoulder abd 2lb Dead lift to biceps curl 2lb Counter push off to pull off squat Gait- able to ambulate 688' before reporting 6/10 on RPE Seated hamstring, piriformis stretch  [facility] Adult PT Treatment:                                             Date: 08/12/24 Pt seen for aquatic therapy today.  Treatment took place in water  3.5-4.75 ft in depth at the [facility] pool. Temp of water  was 91.  Pt entered/exited the pool via stairs in step-to pattern with bil rail.  - Intro to aquatic therapy  principles - UE on yellow hand floats walking forward;  backward (balance challenge- takes bigger step with RLE); side stepping; high knee marching forward  - walking unsupported -> marching forward/ backward unsupported  - UE on wall:   hip add/abd 2x10 ; hip flexion /extension x 10; relaxed squats x10; heel/toe raises x 10 - side step into squat with arm add/abdct holding short hollow noodles - TrA set with 1/2 -> full hollow noodle pull down to thighs x 8;  staggered stance with 1/2 hollow noodle pull down x 5 each LE forward (balance challenge)  - straddling noodle with UE support on corner -> no UE support: cycling; hip abdct/ add and suspended cross country ski   1/19 Manual: STM to lower back and pt ed on self-STM using LEVA  Pt-ed: Reviewed HEP and RPE with pt   There-ex: Resisted ER 1x12 RTB, 2x12 YTB Seated march 1x12  1/16: LTR HS stretching Piriformis stretching Review of HEP UT stretching bilaterally Shoulder rolls UE elevation stretching for lats Seated lumbar side bending stretch Standing marching slow with cues for TrA- UE support on rail Seated ball rolls outs on plinth for lumbar stretch -fwd and to L side x10 Educated on breathing     07/21/24 Nu step L3 UE & LE STM Lt lumbar/Rt thoracic paraspinals Prone upper body lift with forehead on hands Prone alt UE/LE superman Supine ab set + bridge with ball bw knees LTR DKTC with alt toe touches Reverse crunch  EVAL 07/14/24 Discussed use of trekking poles Discussed POC See HEP for exercises Functional testing & how findings align with goals   PATIENT EDUCATION:  Education details: Anatomy of condition, POC, HEP, exercise form/rationale Person educated: Patient Education method: Explanation, Demonstration, Tactile cues, Verbal cues, and Handouts Education comprehension: verbalized understanding, returned demonstration, verbal cues required, tactile cues required, and needs further education  HOME  EXERCISE PROGRAM:  Access Code: [email or web address] URL: [email or web address] Date: 07/14/2024 Prepared by: Harlene Cordon  Exercises - Supine Active Straight Leg Raise  - 2 x daily - 7 x weekly - 2 sets - 10 reps - Supine Bridge  - 2 x daily - 7 x weekly - 2 sets - 10 reps - Sidelying Hip Abduction  - 2 x daily - 7 x weekly - 2 sets - 10 reps - Standing Heel Raises  - 2 x daily - 7 x weekly - 2 sets - 10 reps - Tandem Stance with Support  - 2 x daily - 7 x weekly - 2 sets - 30s hold - Sit to Stand Without Arm Support  - 5 x daily - 7 x weekly - 2 sets - 5 reps   ASSESSMENT:  CLINICAL IMPRESSION: Pt feels somewhat frustrated with continued fatigue and time was taken today to discuss time expected for return to PLOF. Repeated 5times sit to stand which showed the significant improvement that has been made, pt is also standing from chairs without UE and ambulating on flat surfaces without AD.    OBJECTIVE IMPAIRMENTS: Abnormal gait, decreased activity tolerance, decreased balance, decreased endurance, difficulty walking, decreased strength, increased muscle spasms, and pain.   ACTIVITY LIMITATIONS: carrying, lifting, bending, sitting, standing, squatting, stairs, transfers, dressing, locomotion level, and caring for others  PARTICIPATION LIMITATIONS: meal prep, cleaning, laundry, shopping, community activity, and yard work  PERSONAL FACTORS: 3+ comorbidities: osteopenia, poor balance/fall risk, anemia are also affecting patient's functional outcome.   REHAB POTENTIAL: Good  CLINICAL DECISION MAKING: Evolving/moderate complexity  EVALUATION COMPLEXITY: Moderate   GOALS: Goals reviewed with patient? Yes  SHORT TERM GOALS: Target date: 08/09/24  Determine appropriateness of continuing aquatic treatments considering UTI risk Baseline: Goal status: MET  2.  Patient will verbalize compliance with performing exercises multiple times throughout the day Baseline: very good in  AM and mid day, maybe not as good in PM Goal status: MET  3.  Patient will verbalize comfort of walking with trekking poles Baseline:  Goal status: MET  4.  The patient will tolerate exercise intensity as that has been established with general muscle soreness but without increase in pain Baseline:  Goal status: MET    LONG TERM GOALS: Target date: Plan of care date  Berg to improve by MDC Baseline:  Goal status: INITIAL  2.  Able to navigate stairs ascending and descending with single upper extremity use for only balance and safety Baseline:  Goal status: INITIAL  3.  Patient will return to walking her dogs rather than requiring a hired assistant Baseline:  Goal status: INITIAL  4.  Patient will be able to get out of bed and get dressed within 1 hour of being up rather than pain limiting function Baseline:  Goal status: INITIAL  5.  Patient will be independent in long-term fitness program Baseline:  Goal status: INITIAL    PLAN:  PT FREQUENCY: 1-2x/week  PT DURATION: POC date  PLANNED INTERVENTIONS: 97164- PT Re-evaluation, 97750- Physical Performance Testing, 97110-Therapeutic exercises, 97530- Therapeutic activity, [id number]- Neuromuscular re-education, 97535- Self Care, 02859- Manual therapy, [phone]- Gait training, [phone]- Aquatic Therapy, [phone] (1-2 muscles), [phone] (3+ muscles)- Dry Needling, Patient/Family education, Balance training, Stair training, Taping, Joint mobilization, Spinal mobilization, Cryotherapy, and Moist heat.  PLAN FOR NEXT SESSION: assess response/tolerance to aquatic therapy;   gross strengthening  Donnamaria Shands C. Taylie Helder PT, DPT 08/14/24 2:29 PM  [facility] [address] [place], [place], [address] Phone:  [phone]   Fax:  [phone]     Date of referral: 06/20/24 Referring provider:  Dwight Trula SQUIBB, MD     Referring diagnosis? M54.59 (ICD-10-CM) - Other low back pain  Treatment  diagnosis? (if different than referring diagnosis) M62.81 M54.59  What was this (referring dx) caused by? Ongoing Issue  Lysle of Condition: Chronic (continuous duration > 3 months)   Laterality: Both  Current Functional Measure Score: Other BERG (see obj)  Objective measurements identify impairments when they are compared to normal values, the uninvolved extremity, and prior level of function.  [x]  Yes  []  No  Objective assessment of functional ability: Moderate functional limitations   Briefly describe symptoms: LBP with symptoms to lateral right thigh, poor balance, fatigue and weakness.   How did symptoms start: Insidious onset, subacute on chronic.   Average pain intensity:  Last 24 hours: 6/10  Past week: 6-7/10  How often does the pt experience symptoms? Constantly  How much have the symptoms interfered with usual daily activities? Extremely  How has condition changed since care began at this facility? NA - initial visit  In general, how is the patients overall health? Good   BACK PAIN (STarT Back Screening Tool) Has pain spread down the leg(s) at some time in the last 2 weeks? yes Has there been pain in the shoulder or neck at some time in the last 2 weeks? no Has the pt only walked short distances because of back pain? yes Has patient dressed more slowly because of back pain in the past 2 weeks? yes Does patient think it's not safe for a person with this condition to be physically active? No, she thinks it is safe Does patient have worrying thoughts a lot of the time? no Does patient feel back pain is terrible and will never get any better? no Has patient stopped enjoying things they usually enjoy? yes Overall, how bothersome has back pain been in the last 2 weeks?                    Extreme   "

## 2024-08-19 ENCOUNTER — Ambulatory Visit (HOSPITAL_BASED_OUTPATIENT_CLINIC_OR_DEPARTMENT_OTHER): Payer: Self-pay | Admitting: Physical Therapy

## 2024-08-21 ENCOUNTER — Encounter (HOSPITAL_BASED_OUTPATIENT_CLINIC_OR_DEPARTMENT_OTHER): Payer: Self-pay | Admitting: Physical Therapy

## 2024-08-26 ENCOUNTER — Ambulatory Visit (HOSPITAL_BASED_OUTPATIENT_CLINIC_OR_DEPARTMENT_OTHER): Payer: Self-pay | Admitting: Physical Therapy

## 2024-08-28 ENCOUNTER — Encounter (HOSPITAL_BASED_OUTPATIENT_CLINIC_OR_DEPARTMENT_OTHER): Payer: Self-pay | Admitting: Physical Therapy

## 2024-09-02 ENCOUNTER — Ambulatory Visit (HOSPITAL_BASED_OUTPATIENT_CLINIC_OR_DEPARTMENT_OTHER): Payer: Self-pay | Admitting: Physical Therapy

## 2024-09-04 ENCOUNTER — Encounter (HOSPITAL_BASED_OUTPATIENT_CLINIC_OR_DEPARTMENT_OTHER): Payer: Self-pay

## 2024-09-08 ENCOUNTER — Encounter (HOSPITAL_BASED_OUTPATIENT_CLINIC_OR_DEPARTMENT_OTHER): Payer: Self-pay

## 2024-09-11 ENCOUNTER — Encounter (HOSPITAL_BASED_OUTPATIENT_CLINIC_OR_DEPARTMENT_OTHER): Payer: Self-pay

## 2024-09-15 ENCOUNTER — Encounter (HOSPITAL_BASED_OUTPATIENT_CLINIC_OR_DEPARTMENT_OTHER): Payer: Self-pay | Admitting: Physical Therapy

## 2024-09-18 ENCOUNTER — Encounter (HOSPITAL_BASED_OUTPATIENT_CLINIC_OR_DEPARTMENT_OTHER): Payer: Self-pay | Admitting: Physical Therapy

## 2024-09-22 ENCOUNTER — Encounter (HOSPITAL_BASED_OUTPATIENT_CLINIC_OR_DEPARTMENT_OTHER): Payer: Self-pay | Admitting: Physical Therapy

## 2024-09-25 ENCOUNTER — Encounter (HOSPITAL_BASED_OUTPATIENT_CLINIC_OR_DEPARTMENT_OTHER): Payer: Self-pay | Admitting: Physical Therapy

## 2024-09-29 ENCOUNTER — Encounter (HOSPITAL_BASED_OUTPATIENT_CLINIC_OR_DEPARTMENT_OTHER): Payer: Self-pay | Admitting: Physical Therapy

## 2024-10-02 ENCOUNTER — Encounter (HOSPITAL_BASED_OUTPATIENT_CLINIC_OR_DEPARTMENT_OTHER): Payer: Self-pay | Admitting: Physical Therapy

## 2024-10-06 ENCOUNTER — Encounter (HOSPITAL_BASED_OUTPATIENT_CLINIC_OR_DEPARTMENT_OTHER): Payer: Self-pay | Admitting: Physical Therapy

## 2024-10-09 ENCOUNTER — Encounter (HOSPITAL_BASED_OUTPATIENT_CLINIC_OR_DEPARTMENT_OTHER): Payer: Self-pay | Admitting: Physical Therapy

## 2024-10-13 ENCOUNTER — Encounter (HOSPITAL_BASED_OUTPATIENT_CLINIC_OR_DEPARTMENT_OTHER): Payer: Self-pay | Admitting: Physical Therapy

## 2024-10-16 ENCOUNTER — Encounter (HOSPITAL_BASED_OUTPATIENT_CLINIC_OR_DEPARTMENT_OTHER): Payer: Self-pay | Admitting: Physical Therapy

## 2024-10-20 ENCOUNTER — Encounter (HOSPITAL_BASED_OUTPATIENT_CLINIC_OR_DEPARTMENT_OTHER): Payer: Self-pay | Admitting: Physical Therapy

## 2024-10-23 ENCOUNTER — Encounter (HOSPITAL_BASED_OUTPATIENT_CLINIC_OR_DEPARTMENT_OTHER): Payer: Self-pay | Admitting: Physical Therapy

## 2024-10-27 ENCOUNTER — Encounter (HOSPITAL_BASED_OUTPATIENT_CLINIC_OR_DEPARTMENT_OTHER): Payer: Self-pay | Admitting: Physical Therapy

## 2024-10-30 ENCOUNTER — Encounter (HOSPITAL_BASED_OUTPATIENT_CLINIC_OR_DEPARTMENT_OTHER): Payer: Self-pay | Admitting: Physical Therapy

## 2024-11-03 ENCOUNTER — Encounter (HOSPITAL_BASED_OUTPATIENT_CLINIC_OR_DEPARTMENT_OTHER): Payer: Self-pay | Admitting: Physical Therapy

## 2024-11-06 ENCOUNTER — Encounter (HOSPITAL_BASED_OUTPATIENT_CLINIC_OR_DEPARTMENT_OTHER): Payer: Self-pay | Admitting: Physical Therapy
